# Patient Record
Sex: Female | Born: 1935 | Race: White | Hispanic: No | Smoking: Never smoker
Health system: Southern US, Community
[De-identification: ages and names within clinical notes are randomized; demographics above are authoritative.]

## PROBLEM LIST (undated history)

## (undated) DIAGNOSIS — K219 Gastro-esophageal reflux disease without esophagitis: Secondary | ICD-10-CM

## (undated) DIAGNOSIS — R296 Repeated falls: Secondary | ICD-10-CM

## (undated) DIAGNOSIS — T4145XA Adverse effect of unspecified anesthetic, initial encounter: Secondary | ICD-10-CM

## (undated) DIAGNOSIS — K589 Irritable bowel syndrome without diarrhea: Secondary | ICD-10-CM

## (undated) DIAGNOSIS — K0820 Unspecified atrophy of edentulous alveolar ridge: Secondary | ICD-10-CM

## (undated) DIAGNOSIS — E8809 Other disorders of plasma-protein metabolism, not elsewhere classified: Secondary | ICD-10-CM

## (undated) DIAGNOSIS — N879 Dysplasia of cervix uteri, unspecified: Secondary | ICD-10-CM

## (undated) DIAGNOSIS — G7109 Other specified muscular dystrophies: Principal | ICD-10-CM

## (undated) DIAGNOSIS — M419 Scoliosis, unspecified: Secondary | ICD-10-CM

## (undated) DIAGNOSIS — W19XXXA Unspecified fall, initial encounter: Secondary | ICD-10-CM

## (undated) DIAGNOSIS — G7100 Muscular dystrophy, unspecified: Secondary | ICD-10-CM

## (undated) DIAGNOSIS — M1990 Unspecified osteoarthritis, unspecified site: Secondary | ICD-10-CM

## (undated) DIAGNOSIS — H35342 Macular cyst, hole, or pseudohole, left eye: Secondary | ICD-10-CM

## (undated) DIAGNOSIS — I10 Essential (primary) hypertension: Secondary | ICD-10-CM

## (undated) DIAGNOSIS — T8859XA Other complications of anesthesia, initial encounter: Secondary | ICD-10-CM

## (undated) DIAGNOSIS — N952 Postmenopausal atrophic vaginitis: Secondary | ICD-10-CM

## (undated) DIAGNOSIS — C4491 Basal cell carcinoma of skin, unspecified: Secondary | ICD-10-CM

## (undated) HISTORY — DX: Essential (primary) hypertension: I10

## (undated) HISTORY — DX: Dysplasia of cervix uteri, unspecified: N87.9

## (undated) HISTORY — PX: COLPOSCOPY: SHX161

## (undated) HISTORY — DX: Unspecified fall, initial encounter: W19.XXXA

## (undated) HISTORY — DX: Muscular dystrophy, unspecified: G71.00

## (undated) HISTORY — DX: Postmenopausal atrophic vaginitis: N95.2

## (undated) HISTORY — DX: Other specified muscular dystrophies: G71.09

## (undated) HISTORY — DX: Unspecified atrophy of edentulous alveolar ridge: K08.20

## (undated) HISTORY — DX: Basal cell carcinoma of skin, unspecified: C44.91

## (undated) HISTORY — DX: Other disorders of plasma-protein metabolism, not elsewhere classified: E88.09

## (undated) HISTORY — DX: Macular cyst, hole, or pseudohole, left eye: H35.342

## (undated) HISTORY — DX: Repeated falls: R29.6

## (undated) HISTORY — PX: ABDOMINAL HYSTERECTOMY: SHX81

## (undated) HISTORY — DX: Irritable bowel syndrome, unspecified: K58.9

## (undated) HISTORY — DX: Scoliosis, unspecified: M41.9

---

## 1969-10-11 HISTORY — PX: BREAST LUMPECTOMY: SHX2

## 1979-10-12 HISTORY — PX: TOTAL ABDOMINAL HYSTERECTOMY W/ BILATERAL SALPINGOOPHORECTOMY: SHX83

## 1998-06-01 ENCOUNTER — Emergency Department (HOSPITAL_COMMUNITY): Admission: EM | Admit: 1998-06-01 | Discharge: 1998-06-01 | Payer: Self-pay | Admitting: Emergency Medicine

## 1998-06-03 ENCOUNTER — Ambulatory Visit (HOSPITAL_BASED_OUTPATIENT_CLINIC_OR_DEPARTMENT_OTHER): Admission: RE | Admit: 1998-06-03 | Discharge: 1998-06-03 | Payer: Self-pay | Admitting: Orthopedic Surgery

## 1998-07-28 ENCOUNTER — Other Ambulatory Visit: Admission: RE | Admit: 1998-07-28 | Discharge: 1998-07-28 | Payer: Self-pay | Admitting: Obstetrics and Gynecology

## 1998-08-12 ENCOUNTER — Ambulatory Visit (HOSPITAL_BASED_OUTPATIENT_CLINIC_OR_DEPARTMENT_OTHER): Admission: RE | Admit: 1998-08-12 | Discharge: 1998-08-12 | Payer: Self-pay | Admitting: Orthopedic Surgery

## 1999-08-28 ENCOUNTER — Other Ambulatory Visit: Admission: RE | Admit: 1999-08-28 | Discharge: 1999-08-28 | Payer: Self-pay | Admitting: Obstetrics and Gynecology

## 2000-08-30 ENCOUNTER — Other Ambulatory Visit: Admission: RE | Admit: 2000-08-30 | Discharge: 2000-08-30 | Payer: Self-pay | Admitting: Obstetrics and Gynecology

## 2000-10-17 ENCOUNTER — Ambulatory Visit (HOSPITAL_COMMUNITY): Admission: RE | Admit: 2000-10-17 | Discharge: 2000-10-18 | Payer: Self-pay | Admitting: Ophthalmology

## 2000-10-17 ENCOUNTER — Encounter: Payer: Self-pay | Admitting: Ophthalmology

## 2001-07-04 ENCOUNTER — Ambulatory Visit (HOSPITAL_COMMUNITY): Admission: RE | Admit: 2001-07-04 | Discharge: 2001-07-04 | Payer: Self-pay | Admitting: Gastroenterology

## 2001-08-31 ENCOUNTER — Other Ambulatory Visit: Admission: RE | Admit: 2001-08-31 | Discharge: 2001-08-31 | Payer: Self-pay | Admitting: Obstetrics and Gynecology

## 2001-10-11 HISTORY — PX: EYE SURGERY: SHX253

## 2002-12-14 ENCOUNTER — Other Ambulatory Visit: Admission: RE | Admit: 2002-12-14 | Discharge: 2002-12-14 | Payer: Self-pay | Admitting: Obstetrics and Gynecology

## 2003-08-11 ENCOUNTER — Emergency Department (HOSPITAL_COMMUNITY): Admission: AD | Admit: 2003-08-11 | Discharge: 2003-08-11 | Payer: Self-pay | Admitting: Emergency Medicine

## 2004-11-30 ENCOUNTER — Other Ambulatory Visit: Admission: RE | Admit: 2004-11-30 | Discharge: 2004-11-30 | Payer: Self-pay | Admitting: Obstetrics and Gynecology

## 2005-07-05 ENCOUNTER — Emergency Department (HOSPITAL_COMMUNITY): Admission: EM | Admit: 2005-07-05 | Discharge: 2005-07-05 | Payer: Self-pay | Admitting: Family Medicine

## 2005-09-14 ENCOUNTER — Emergency Department (HOSPITAL_COMMUNITY): Admission: EM | Admit: 2005-09-14 | Discharge: 2005-09-14 | Payer: Self-pay | Admitting: Emergency Medicine

## 2005-12-21 ENCOUNTER — Other Ambulatory Visit: Admission: RE | Admit: 2005-12-21 | Discharge: 2005-12-21 | Payer: Self-pay | Admitting: Obstetrics and Gynecology

## 2006-02-09 ENCOUNTER — Encounter: Payer: Self-pay | Admitting: Emergency Medicine

## 2006-02-28 ENCOUNTER — Emergency Department (HOSPITAL_COMMUNITY): Admission: EM | Admit: 2006-02-28 | Discharge: 2006-02-28 | Payer: Self-pay | Admitting: Emergency Medicine

## 2006-10-26 ENCOUNTER — Emergency Department (HOSPITAL_COMMUNITY): Admission: EM | Admit: 2006-10-26 | Discharge: 2006-10-26 | Payer: Self-pay | Admitting: Family Medicine

## 2006-11-07 ENCOUNTER — Encounter
Admission: RE | Admit: 2006-11-07 | Discharge: 2007-02-05 | Payer: Self-pay | Admitting: Physical Medicine & Rehabilitation

## 2006-11-08 ENCOUNTER — Ambulatory Visit: Payer: Self-pay | Admitting: Physical Medicine & Rehabilitation

## 2006-11-28 ENCOUNTER — Encounter: Admission: RE | Admit: 2006-11-28 | Discharge: 2006-12-26 | Payer: Self-pay | Admitting: Endocrinology

## 2006-12-26 ENCOUNTER — Other Ambulatory Visit: Admission: RE | Admit: 2006-12-26 | Discharge: 2006-12-26 | Payer: Self-pay | Admitting: Obstetrics and Gynecology

## 2007-06-26 ENCOUNTER — Encounter
Admission: RE | Admit: 2007-06-26 | Discharge: 2007-09-24 | Payer: Self-pay | Admitting: Physical Medicine & Rehabilitation

## 2007-06-27 ENCOUNTER — Ambulatory Visit: Payer: Self-pay | Admitting: Physical Medicine & Rehabilitation

## 2007-08-16 ENCOUNTER — Ambulatory Visit: Payer: Self-pay | Admitting: Physical Medicine & Rehabilitation

## 2007-09-13 ENCOUNTER — Encounter
Admission: RE | Admit: 2007-09-13 | Discharge: 2007-12-12 | Payer: Self-pay | Admitting: Physical Medicine & Rehabilitation

## 2007-10-09 ENCOUNTER — Ambulatory Visit: Payer: Self-pay | Admitting: Physical Medicine & Rehabilitation

## 2007-11-01 ENCOUNTER — Encounter
Admission: RE | Admit: 2007-11-01 | Discharge: 2008-01-30 | Payer: Self-pay | Admitting: Physical Medicine & Rehabilitation

## 2007-11-29 ENCOUNTER — Ambulatory Visit: Payer: Self-pay | Admitting: Physical Medicine & Rehabilitation

## 2007-12-06 ENCOUNTER — Encounter
Admission: RE | Admit: 2007-12-06 | Discharge: 2007-12-27 | Payer: Self-pay | Admitting: Physical Medicine & Rehabilitation

## 2007-12-27 ENCOUNTER — Other Ambulatory Visit: Admission: RE | Admit: 2007-12-27 | Discharge: 2007-12-27 | Payer: Self-pay | Admitting: Obstetrics and Gynecology

## 2008-01-11 ENCOUNTER — Ambulatory Visit: Payer: Self-pay | Admitting: Physical Medicine & Rehabilitation

## 2008-02-14 ENCOUNTER — Emergency Department (HOSPITAL_COMMUNITY): Admission: EM | Admit: 2008-02-14 | Discharge: 2008-02-14 | Payer: Self-pay | Admitting: Family Medicine

## 2008-07-01 ENCOUNTER — Encounter
Admission: RE | Admit: 2008-07-01 | Discharge: 2008-07-02 | Payer: Self-pay | Admitting: Physical Medicine & Rehabilitation

## 2008-07-02 ENCOUNTER — Ambulatory Visit: Payer: Self-pay | Admitting: Physical Medicine & Rehabilitation

## 2008-11-19 ENCOUNTER — Ambulatory Visit (HOSPITAL_COMMUNITY): Admission: RE | Admit: 2008-11-19 | Discharge: 2008-11-19 | Payer: Self-pay | Admitting: Endocrinology

## 2008-12-31 ENCOUNTER — Other Ambulatory Visit: Admission: RE | Admit: 2008-12-31 | Discharge: 2008-12-31 | Payer: Self-pay | Admitting: Obstetrics and Gynecology

## 2008-12-31 ENCOUNTER — Encounter: Payer: Self-pay | Admitting: Obstetrics and Gynecology

## 2008-12-31 ENCOUNTER — Ambulatory Visit: Payer: Self-pay | Admitting: Obstetrics and Gynecology

## 2009-12-18 ENCOUNTER — Emergency Department (HOSPITAL_COMMUNITY): Admission: EM | Admit: 2009-12-18 | Discharge: 2009-12-18 | Payer: Self-pay | Admitting: Family Medicine

## 2010-01-01 ENCOUNTER — Ambulatory Visit: Payer: Self-pay | Admitting: Obstetrics and Gynecology

## 2010-01-01 ENCOUNTER — Other Ambulatory Visit: Admission: RE | Admit: 2010-01-01 | Discharge: 2010-01-01 | Payer: Self-pay | Admitting: Obstetrics and Gynecology

## 2011-01-04 LAB — POCT RAPID STREP A (OFFICE): Streptococcus, Group A Screen (Direct): NEGATIVE

## 2011-02-23 NOTE — Assessment & Plan Note (Signed)
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  I saw her last  approximately 1 month ago.  She was then sent to aquatic exercise and  she has been very happy with this, in fact, states it is about the best  thing she has ever done for her weakness and mobility problems.  She can  climb steps.  She drives.  She uses the yard mower.  I did review her  therapy notes from physical therapy at [facility].  She has made some improvements in strength.   She has a Trendelenburg gait pattern.  She has decreased arm external  rotation strength.  She has good biceps and triceps grip as well as hip  flexion, knee extension, and hip adductor  strength.   REVIEW OF SYSTEMS:  Positive with trouble walking, diarrhea,  constipation, and weight gain.   EXAMINATION:  VITAL SIGNS:  Blood pressure 142/71, pulse 70,  respirations 18, oxygen saturation 96% on room air.  GENERAL:  Ambulates with cane, Trendelenburg gait.  See above for the  rest of exam.   IMPRESSION:  Limb-girdle dystrophy, making progress with aquatic  exercise in terms of her overall stamina and strength, submaximal  exercise with emphasis on balance.   PLAN:  Will continue 6 more visits and see her back in a month.  She  will need to be transitioned to a community program.      Erick Colace, M.D.  Electronically Signed     AEK/MedQ  D:  07/25/2007 10:04:43  T:  07/25/2007 16:08:51  Job #:  [id number]   cc:   Jeannett Senior A. Evlyn Kanner, M.D.  Fax: [phone]   Melvyn Novas, M.D.  Fax: [phone]

## 2011-02-23 NOTE — Procedures (Signed)
[name], [name]             ACCOUNT NO.:  [id number]   MEDICAL RECORD NO.:  [id number]            PATIENT TYPE:   LOCATION:                                 FACILITY:   PHYSICIAN:  Erick Colace, M.D.   DATE OF BIRTH:   DATE OF PROCEDURE:  10/10/2007  DATE OF DISCHARGE:                               OPERATIVE REPORT   Treatment today performed for sinus related pain, bilateral BL SDU 24.5  a 20 Hz stim between SDU and BL 2 bilaterally.  The patient tolerated  the procedure well.  Return in 1 month.  She has had good relief of her  typical sinus pain and congestion.      Erick Colace, M.D.  Electronically Signed     AEK/MEDQ  D:  10/10/2007 17:07:38  T:  10/11/2007 08:52:34  Job:  [id number]

## 2011-02-23 NOTE — Procedures (Signed)
Catherine Harding, Catherine Harding             ACCOUNT NO.:  [id number]   MEDICAL RECORD NO.:  [id number]          PATIENT TYPE:  REC   LOCATION:  [facility]                          FACILITY:  [facility]   PHYSICIAN:  Erick Colace, M.D.DATE OF BIRTH:  1929/11/23   DATE OF PROCEDURE:  11/30/2007  DATE OF DISCHARGE:                               OPERATIVE REPORT   Ms. Ludwig follows up today for acupuncture treatment.   INDICATIONS:  Sinus pain.  She has had good relief.  She has actually  had no sinus infections this last winter since I last saw her on November 02, 2007.   Needles placed at bilateral BLs and bladder 2, STS, and stomach 2,  midline at DU24.5, electrical stimulation at 20 Hz for 30 minutes  between ST2 and BL2.  The patient tolerated procedure well.  Return in 1  month.      Erick Colace, M.D.  Electronically Signed     AEK/MEDQ  D:  11/30/2007 10:19:34  T:  12/01/2007 08:20:55  Job:  [id number]   cc:   Melvyn Novas, M.D.  Fax: [phone]

## 2011-02-23 NOTE — Procedures (Signed)
NAMEMARYLEE, Catherine Harding             ACCOUNT NO.:  [id number]   MEDICAL RECORD NO.:  [id number]          PATIENT TYPE:  REC   LOCATION:  [facility]                          FACILITY:  [facility]   PHYSICIAN:  Erick Colace, M.D.DATE OF BIRTH:  June 15, 1930   DATE OF PROCEDURE:  09/14/2007  DATE OF DISCHARGE:                               OPERATIVE REPORT   Acupuncture treatment.  Bilateral BL2, ST2 and midline 24.5 DU 24.5.  4  Hz stim between ST2 and BL2 20 Hz x 30 minutes.  The patient tolerated  the procedure well.  Treatment is for sinus related pain.      Erick Colace, M.D.  Electronically Signed     AEK/MEDQ  D:  09/14/2007 14:00:52  T:  09/14/2007 14:37:51  Job:  [id number]

## 2011-02-23 NOTE — Assessment & Plan Note (Signed)
Catherine Harding returns today.  She has a history of limb-girdle  dystrophy.  She has had falls.  She has had a fall despite using a cane.  She has gone through a balance program, but has not received any type of  ongoing lower extremity strengthening program.   EXAMINATION:  GENERAL:  No acute distress.  Mood and affect appropriate.  Her gait is waddling.   FUNCTIONAL STATUS:  Includes problems walking, which reduce her ability  to shop by herself.  She doe snot use a cane, but she can drive.   I would like for her to get a submaximal hip extensor, knee extensor  strengthening program from outpatient physical therapy, two visits.  I  will see her back in six weeks to monitor this.   I also had made previous recommendation for aquatic therapy that she can  do in a community based program, and she will also start this once the  weather gets nicer      Erick Colace, M.D.  Electronically Signed     AEK/MedQ  D:  11/30/2007 10:21:49  T:  11/30/2007 19:12:20  Job #:  [id number]   cc:   [facility]  [address]  [place], [place] [address]  [phone]

## 2011-02-23 NOTE — Procedures (Signed)
NAMECECILEE, Catherine Harding             ACCOUNT NO.:  [id number]   MEDICAL RECORD NO.:  [id number]          PATIENT TYPE:  REC   LOCATION:  [facility]                          FACILITY:  [facility]   PHYSICIAN:  Erick Colace, M.D.DATE OF BIRTH:  1930/03/31   DATE OF PROCEDURE:  08/29/2007  DATE OF DISCHARGE:                               OPERATIVE REPORT   ACUPUNCTURE TREATMENT FOR SINUS PAIN:   Needles placed to bilateral BL2, ST2, DE24.5, E-stim at 20 hertz, 30  minutes.  The patient tolerated the procedure well.      Erick Colace, M.D.  Electronically Signed     AEK/MEDQ  D:  08/29/2007 09:41:07  T:  08/29/2007 14:04:47  Job:  [id number]   cc:   Melvyn Novas, M.D.  Fax: [phone]

## 2011-02-23 NOTE — Procedures (Signed)
Catherine Harding, Catherine Harding             ACCOUNT NO.:  [id number]   MEDICAL RECORD NO.:  [id number]          PATIENT TYPE:  REC   LOCATION:  [facility]                          FACILITY:  [facility]   PHYSICIAN:  Erick Colace, M.D.DATE OF BIRTH:  1930-04-30   DATE OF PROCEDURE:  11/02/2007  DATE OF DISCHARGE:                               OPERATIVE REPORT   Treatment today is acupuncture for sinus pain.   Needles placed at bilateral BL-2, ST-2, and midline DU-24.5, Estem 20 Hz  x30 minutes between ST-2 and BL-2.  The patient tolerated the procedure  well.  Return in one month.      Erick Colace, M.D.  Electronically Signed     AEK/MEDQ  D:  11/02/2007 17:33:34  T:  11/02/2007 23:09:33  Job:  [id number]   cc:   Jeannett Senior A. Evlyn Kanner, M.D.  Fax: [phone]   Melvyn Novas, M.D.  Fax: [phone]

## 2011-02-23 NOTE — Assessment & Plan Note (Signed)
This is a follow-up visit, 77 year old female with limb-girdle  dystrophy, progressive weakness in the lower greater than upper  extremities causing gait disorder.  I saw her on November 08, 2006 for  acupuncture evaluation.   The patient decided not to pursue the acupuncture.  In the interval  period of time she has noted some progressive weakness.  She has tried  some physical therapy in the past but feels like she was actually made  weaker by it.  This is land based therapy.   She can climb steps.  She can drive.  She continues to do yard work.  Does the riding mower.  Does not use a push mower.   She has a history of irritable bowel syndrome with alternating  constipation and diarrhea.   SOCIAL HISTORY:  Divorced, lives alone.   PHYSICAL EXAMINATION:  VITAL SIGNS:  Her blood pressure is 152/78, pulse  72, respirations 18, O2 sat 96% on room air.  She ambulates with a cane.  He affect is alert.  Orientation x3.  She is well developed, well  nourished.  NECK:  Her neck has good range of motion.  EXTREMITIES:  Her upper extremity strength is 4+/5 bilateral deltoid,  biceps, triceps grip.  In the lower extremities she has 3- in the hip  extensors, she has 4- in the hip flexors, 3+ at the hip abductors on the  left and 4- on the right.  Knee extensors are 4+.  Ankle dorsiflexors  are 5.  Her deep tendon reflexes are normal.  Her gait is a wobbling  type of gluteus medius gait bilaterally.  Only mild lordosis.   IMPRESSION:  Limb-girdle dystrophy primarily with hip extensor and  abductor as well as flexor weakness relatively intact adductor.  No  significant quad or ankle weakness.  Also, her shoulder girdle is not  showing any appreciable weakness on exam; however, does notice fatigue  with more prolonged activity.   We discussed overall principal of submaximal exercise in the setting of  muscular dystrophy.  She has tried previous Thera-Band type exercises  and feels like she had  reduction of her overall function as a result of  this.  For this reason, I have recommended aquatic exercise with  precautions of submaximal exercise and doing it no more than 2 times per  week.  I have encouraged her current activities with include walking as  well as riding mower usage and even some vacuuming.  Remain concerned  about vacuuming and getting caught up in the cords.  She does have some  balance issues but has already gone through balance retraining program  out at [facility].  Strategy mainly would be to minimize hazards at this  point.  No sensory component.  No visual component.   I will see her back in 1 month for further followup on her progress.      Erick Colace, M.D.  Electronically Signed     AEK/MedQ  D:  06/27/2007 14:21:42  T:  06/27/2007 15:08:32  Job #:  [id number]   cc:   Jeannett Senior A. Evlyn Kanner, M.D.  Fax: [phone]   Melvyn Novas, M.D.  Fax: [phone]   Tasia Catchings, M.D.  Fax: [phone]   Lunette Stands, M.D.  Fax: [phone]

## 2011-02-23 NOTE — Assessment & Plan Note (Signed)
Catherine Harding returns today.  She has a history of limb girdle dystrophy  with falls.  I sent her through the [facility].  She has been able to walk without a cane at home now without  falls.  She has done some submaximal lower extremity strengthening,  particular around the hip girdle.  She has also had some work done in  terms of relieving intrascapular pain.   In terms of her sinuses, she has not had any difficulty that way.   She still has lower extremity weakness as expected from her limb girdle  dystrophy but no exacerbation.   REVIEW OF SYSTEMS:  Positive for some weight gain in addition to her  trouble walking, weakness, tremor which are chronic.   PHYSICAL EXAMINATION:  VITAL SIGNS:  Her blood pressure is 141/62, pulse  68, respirations 18, O2 sat 96% on room air.  GENERAL:  No acute distress.  Mood and affect appropriate.  MUSCULOSKELETAL:  Her gait is waddling due to hip abductor weakness.  She has bilateral Trendelenburg.  Her left hip abductors are 3 minus,  hip extensors are 3 minus, right hip extensor 3 minus, right hip  abductor 3.  Quad ankle dorsiflexors are 5/5.   IMPRESSION:  1. Limb girdle dystrophy primarily affecting lower extremities      proximal hip area.  2. Balance disorder secondary to above, improved.   PLAN:  1. Continue home exercise program.  2. No medications prescribed through this office.  3. I will see her back in about 6 months or sooner should she have      some increasing problem with falls or weakness or with sinus pain.      Erick Colace, M.D.  Electronically Signed     AEK/MedQ  D:  01/11/2008 10:52:01  T:  01/11/2008 11:15:18  Job #:  [id number]   cc:   Melvyn Novas, M.D.  Fax: [phone]   Tasia Catchings, M.D.  Fax: [phone]   Tera Mater. Evlyn Kanner, M.D.  Fax: [phone]

## 2011-02-23 NOTE — Assessment & Plan Note (Signed)
A 77 year old female with limb girdle dystrophy, progressive weakness of  lower extremities and upper extremities causing gait disorder.  I saw  her last month, and sent to aquatic exercise, and has been doing well  with this.  However, over the last couple of weeks she has had some  problems with bronchitis.  She thinks the moisture from the pool  exacerbates some of her respiratory symptoms, and wants to hold off on  further treatment until this can be resolved.   Patient has sinus pain, a lot of sinus drainage per her report.  Her  sleep is overall good.  She has some pain in the thighs, and more  particularly in the left knee and right shoulder.  However, she has quit  taking glucosamine over the last couple of weeks.  She uses a cane  mainly for balance.  She has some difficulty with shopping and household  duties, trouble walking, diarrhea, and constipation.   EXAMINATION:  Reveals an elderly female in no acute distress.  Her blood pressure is 144/85.  Pulse 82.  Respirations 18.  Her O2  saturation 95% room air.  GENERAL:  In no acute distress.  Orientation x3.  Affect is bright.  She has some tenderness over the frontal sinuses, as well as maxillary  sinuses.  She has pain in the right shoulder with adduction maneuver.  Her knee  has no effusion on the left side, but medial joint line tenderness.  She  has full range of motion of the knees, hips, ankles, as well as elbows,  wrists, and fingers.  However, shoulder on the right is reduced in  adduction due to pain.  Motor strength is 5- at the deltoids, 5 in the  biceps, triceps grip, 4 at the hip flexors, 5 at the quads, TAs, and  gastrocs bilaterally.  Gait shows no evidence of toe drag or knee  instability.  She does walk slowly with a widened base of support.   IMPRESSION:  1. Limb girdle dystrophy.  Making progress with aquatic exercise in      terms of overall stamina, however, she has had to stop it due to  respiratory issues.  She would like to get a home exercise program,      and I think that is a good idea that she keep up over the winter,      and perhaps get in to a community based program once her      respiratory issues seem to have resolved.  2. Sinusitis.  Discussed that she could try some acupuncture to see if      this may help with reducing congestion and some of the sinus pain.   ADDENDUM  Needles placed bilaterally ST-2, BL-2, and DU-24.5.  Treatment time 20  minutes.  The patient tolerated the procedure well.      Erick Colace, M.D.  Electronically Signed     AEK/MedQ  D:  08/17/2007 12:19:36  T:  08/17/2007 16:11:30  Job #:  [id number]   cc:   Jeannett Senior A. Evlyn Kanner, M.D.  Fax: [phone]   Melvyn Novas, M.D.  Fax: [phone]   Aris Lot, M.D.  [facility]

## 2011-02-23 NOTE — Assessment & Plan Note (Signed)
Catherine Harding returns today.  She has a history of limb-girdle  dystrophy.  She has had no further falls since I last saw her on January 11, 2008.  She uses a cane outside the home mainly.  She no longer climbs  steps but can drive.  She walks 10 minutes at a time.  She has no  significant pain.  She needs certain assistance with household duties  and shopping.  She has problems with walking and dizziness.   PHYSICAL EXAMINATION:  VITAL SIGNS:  Blood pressure is 136/72, pulse 70,  respiratory rate is 17, and O2 sat 96% on room air.   INTERVAL MEDICAL HISTORY:  She has had a DEXA scan showing T score of -  2.6.  She remains on Fosamax.   Her sinus discomfort has not become an issue yet this year.  She does  have chronic sinusitis and sinus pain in the winter time typically.   Her Oswestry scale today is 40%.   Her gait is waddling due to hip abductor weakness.  She has 4- hip  abductors.  Hip flexors are 4-.  Quads and ankle dorsiflexors are 5/5.   1. Limb-girdle dystrophy.  She actually has been more active and some      muscle improvements noted.  2. Balance disorder as stated above, improved after balance program      and lower extremity strengthening.  3. Osteoporosis with fall risk.  Following with Dr. Evlyn Kanner, getting      some vitamin D supplementation now.      Erick Colace, M.D.  Electronically Signed     AEK/MedQ  D:  07/02/2008 16:10:96  T:  07/02/2008 23:19:41  Job #:  [id number]   cc:   Jeannett Senior A. Evlyn Kanner, M.D.  Fax: [phone]   Tasia Catchings, M.D.  Fax: [phone]   Melvyn Novas, M.D.  Fax: [phone]

## 2011-02-26 NOTE — Op Note (Signed)
[facility]  Patient:    Catherine Harding, Catherine Harding                    MRN: [id number] Proc. Date: 10/17/00 Adm. Date:  [id number] Attending:  Ernesto Rutherford                           Operative Report  PREOPERATIVE DIAGNOSIS:  Macular hole, left eye--stage III.  POSTOPERATIVE DIAGNOSIS:  Macular hole, left eye--stage III.  OPERATION PERFORMED: 1. Posterior vitrectomy and membrane peel--internal limiting membrane,    left eye. 2. Injection of vitreous substitute--SF6 20% left eye.  SURGEON:  Ernesto Rutherford, M.D.  ANESTHESIA:  General endotracheal.  INDICATIONS FOR PROCEDURE:  The patient is a  77 year old woman who has profound vision loss of the left eye on the basis of a large approximately 900 to 1000 micron stage III macular hole with an operculum which measured approximately 125 microns in size.  The patient was recently pseudophakic in anticipation of vitrectomy membrane peel--internal limiting membrane left eye. The patient understands the risks of anesthesia, including the rare occurrence of death, but also to the eye including retinal detachment, need for another surgery, no change invision, loss of vision, progressive disease despite intervention.  After appropriate signed consent was obtained, the patient was taken to operating room.  DESCRIPTION OF PROCEDURE:  In the operating room  general endotracheal anesthesia was instituted.  The left periocular region had been preoperatively marked.  It was now sterilely prepped and draped in the usual ophthalmic fashion.  A lidspeculum was applied.  Conjunctival peritomy was fashioned temporally and superonasally.  4 mm infusion was secured inferotemporally 3.5 mm posterior limbus inferotemporal quadrant.  Placement vitreous cavity verified visually.  Superior sclerotomy then fashioned.   Wild microscope placed in position with the Biom attached.  Core vitrectomy was then begun. Iatrogenic  posterior vitreous detachment was necessary and this was engaged with active suction nasal to the optic nerve and the posterior hyaloid was moved off the posterior fold and then anterior ____________  360 degrees. The vitreous skirt was then trimmed  360 degrees.  At this time an 80% fluid-air exchange was then completed. Diluted ICG which had been premixed was then placed overlying the posterior pole.  The dilution was 0.5 cc diluted with an additional 4.5 cc of balanced salt solution injected over the posterior pole.  This was allowed to remain only long enough to allow for ____________ and only 1 cc of this was used.  This was allowed to remain on the posterior pole only long enough to aspirate this passively with [facility] brush.  At this time a Rice pick was then used to engage the internal limiting membrane and then elevation was then carried out with the [facility] forceps 360 degrees and excellent mobilization of the ____________ of the macular hole confirmed.  At this time a fluid-air exchange was then completed.  The hole edges were mobilized nicely.  Air-SF6 20% was then injected.  Superior sclerotomies were closed with 7-0 Vicryl sutures.  The infusion removed and similarly closed with 7-0 Vicryl sutures.  Subconjunctival injection of antibiotic and steroid were applied after closure of the conjunctiva with 7-0 Vicryl suture.  A sterile patch and Fox shield were applied.  The patient tolerated the procedure well without complication. DD:  10/17/00 TD:  10/17/00 Job: [id number]

## 2011-02-26 NOTE — Group Therapy Note (Signed)
Tuesday, November 08, 2006:   Consult requested for consideration of acupuncture for appetite  suppression by Dr. Porfirio Mylar Dohmeier.   HISTORY:  A 77 year old female with history of significant four limb  girdle dystrophy with progressive weakness in the lower and upper  extremities causing gait disorder. She has had some increased weight  because of inability to exercise and is looking for options to lose  weight given her limited exercise capacity.   REVIEW OF SYSTEMS:  Positive for diarrhea, constipation, shortness of  breath and weight gain.   PAST MEDICAL HISTORY:  Significant for:  1. Atypical pseudocholinesterase deficiency limiting certain muscle      relaxants associated with anesthesia. In addition she has ALLERGY      TO SULFA, CODEINE, CIPRO, CELEBREX AND MORPHINE and POSSIBLE      PROBLEMS WITH LIDOCAINE AS WELL.  2. Hypertension as well.   Other physicians include Dr. Sherin Quarry of Gastroenterology, Dr. Adrian Prince and Dr. Melvyn Novas, M.D., and Dr. Lunette Stands.   PHYSICAL EXAMINATION:  GENERAL: No acute distress. Mood and affect  appropriate.  Her gait is Trendelenburg, a waddling type of gait bilaterally. She has  4/5 strength in deltoids, 5 in the biceps/triceps group, 4- in the hip  flexor and 4 knee extensor and 5 in the ankle dorsiflexors bilaterally.  Neck range of motion is good. No facial weakness noted. No tenderness to  palpation in the thoracic, lumbar or cervical para-paraspinal muscles.   Blood pressure is 144/68, pulse 79, respirations 18. O2 sat is 95% on  room air.   IMPRESSION:  Limb girdle dystrophy. The patient can do submaximal  exercise, but this would likely not result in sufficient weight loss.  Given multiple medication allergies, would shy away from any  pharmaceutical appetite suppressants.   We did discuss utility of acupuncture and discussed the literature  supporting the use of acupuncture for weight loss is rather limited.  Published randomized cross-over study in Int J of Obesity Management  2005, demonstrated reduction of waist circumference with a twice a week  x 6wk protocol suing points KI 14, ST 28, CV 6, CV9, ST 40, ST 36 and SP  4. Did discuss the use of auricular type of acupuncture with self-  adhesive needles for this purpose. She would like to check her insurance  benefits for this and will assist her in that matter.   I will do a preview treatment today to assess her tolerance.   ADDENDUM:  Acupuncture treatment today consists of needles at DU24.5 and DU20.  Electrical stimulation between these two points at 4 Hz x15 minutes. In  addition, left auricular points, hypothalamus as well as stomach were  utilized. The patient tolerated the procedure well. Pre-post procedure  instruction given. She is to remove the self-adhesive needles in 1 week  if they do not fall out earlier than that. If she has any difficulty she  can return to clinic.   Thank you for this interesting consultation.      Erick Colace, M.D.  Electronically Signed     AEK/MedQ  D:  11/08/2006 17:02:55  T:  11/08/2006 20:36:13  Job #:  [id number]   cc:   Melvyn Novas, M.D.  Fax: [phone]

## 2011-05-11 ENCOUNTER — Other Ambulatory Visit: Payer: Self-pay | Admitting: Obstetrics and Gynecology

## 2011-05-21 NOTE — Telephone Encounter (Signed)
Addended by: Venora Maples on: 05/21/2011 04:10 PM   Modules accepted: Orders

## 2011-05-21 NOTE — Telephone Encounter (Signed)
PT ASKING FOR REFILLS ON ESTRADIOL & [facility] TOLD HER THEY HAVE NEVER HEARD FROM OUR OFFICE ON IT. I TOLD HER WE CONTACTED THEM ON 05/11/11 & DENIED IT SINCE NO AEX SET & LAST ONE WAS3/24/11. I TOLD HER IF SHE WOULD SET UP HER OVERDUE AEX I WOULD CHECK WITH DR. GOTTSEGEN TO SEE IF WE COULD FILL IT ONE TIME FOR HER. SHE STATES SHE DOES NOT WANT TO SET UP AN APPT.

## 2011-09-08 ENCOUNTER — Encounter: Payer: Self-pay | Admitting: *Deleted

## 2011-09-08 DIAGNOSIS — G7100 Muscular dystrophy, unspecified: Secondary | ICD-10-CM | POA: Insufficient documentation

## 2011-09-08 DIAGNOSIS — E8809 Other disorders of plasma-protein metabolism, not elsewhere classified: Secondary | ICD-10-CM | POA: Insufficient documentation

## 2011-09-08 DIAGNOSIS — K589 Irritable bowel syndrome without diarrhea: Secondary | ICD-10-CM | POA: Insufficient documentation

## 2011-09-08 DIAGNOSIS — N952 Postmenopausal atrophic vaginitis: Secondary | ICD-10-CM | POA: Insufficient documentation

## 2011-09-08 DIAGNOSIS — M810 Age-related osteoporosis without current pathological fracture: Secondary | ICD-10-CM | POA: Insufficient documentation

## 2011-09-08 DIAGNOSIS — H35342 Macular cyst, hole, or pseudohole, left eye: Secondary | ICD-10-CM | POA: Insufficient documentation

## 2011-09-08 DIAGNOSIS — D4989 Neoplasm of unspecified behavior of other specified sites: Secondary | ICD-10-CM | POA: Insufficient documentation

## 2011-09-08 DIAGNOSIS — I10 Essential (primary) hypertension: Secondary | ICD-10-CM | POA: Insufficient documentation

## 2011-09-15 ENCOUNTER — Ambulatory Visit (INDEPENDENT_AMBULATORY_CARE_PROVIDER_SITE_OTHER): Payer: Medicare Other | Admitting: Obstetrics and Gynecology

## 2011-09-15 ENCOUNTER — Encounter: Payer: Self-pay | Admitting: Obstetrics and Gynecology

## 2011-09-15 DIAGNOSIS — N952 Postmenopausal atrophic vaginitis: Secondary | ICD-10-CM

## 2011-09-15 DIAGNOSIS — M810 Age-related osteoporosis without current pathological fracture: Secondary | ICD-10-CM

## 2011-09-15 DIAGNOSIS — Z780 Asymptomatic menopausal state: Secondary | ICD-10-CM

## 2011-09-15 DIAGNOSIS — N951 Menopausal and female climacteric states: Secondary | ICD-10-CM

## 2011-09-15 MED ORDER — ESTRADIOL 0.0375 MG/24HR TD PTTW: 1.0000 | MEDICATED_PATCH | TRANSDERMAL | Status: DC

## 2011-09-15 NOTE — Progress Notes (Signed)
The patient came back to see me today for further followup. We have had her on systemic estrogen for treatment of both menopausal symptoms and vaginal dryness with excellent results. She is having no bleeding. She is having no pelvic pain. She does have osteoporosis. She currently is not on medication for it and she's discussed with Dr. Evlyn Kanner. She has not had any fractures. She is however Foley more because of her muscular dystrophy. She is taking calcium or vitamin D. Medications that she previously tried for Fosamax and Reclast but stopped both because of side effects. 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 She is up-to-date on mammograms.  ROS: 12 system review done. See above for certain pertinent positives. Other positives include irritable bowel syndrome, hypertension, macular repair of hole, pseudo cholinesterase deficiency.  HEENT: Within normal limits.  Kennon Portela present Neck: No masses. Supraclavicular lymph nodes: Not enlarged. Breasts: Examined in both sitting and lying position. Symmetrical without skin changes or masses. Abdomen: Soft no masses guarding or rebound. No hernias. Pelvic: External within normal limits. BUS within normal limits. Vaginal examination shows good estrogen effect, no cystocele enterocele or rectocele. Cervix and uterus absent. Adnexa within normal limits. Rectovaginal confirmatory. Extremities within normal limits.  Assessment: Menopausal symptoms. Atrophic vaginitis. Osteoporosis. CIN. Muscular dystrophy.  Plan: We'll long discussion of benefits and risks of HRT. For her they are treatment of menopausal symptoms including atrophic vaginitis. Since she cannot tolerate bone drugs and is at high risk for fracture due to bone density and muscular dystrophy it would also help her for that as well. I. Would like to see her off of oral estrogen at her age. As a result of this we switched her today to Vivelle dot patch 0.0375 mg BIW. Samples and  prescription given.

## 2011-10-18 ENCOUNTER — Encounter: Payer: Self-pay | Admitting: *Deleted

## 2011-10-18 NOTE — Progress Notes (Signed)
Patient ID: Catherine Harding, female   DOB: May 28, 1930, 78 y.o.   MRN: [id number] Pt called wanting to know where she should place estrogen patch. The buttocks or on stomach as directed on box. Lm on pt vm that either would be okay.

## 2012-02-17 ENCOUNTER — Encounter: Payer: Self-pay | Admitting: Obstetrics and Gynecology

## 2012-02-29 ENCOUNTER — Other Ambulatory Visit: Payer: Self-pay

## 2012-03-21 ENCOUNTER — Ambulatory Visit (INDEPENDENT_AMBULATORY_CARE_PROVIDER_SITE_OTHER): Payer: Medicare Other | Admitting: *Deleted

## 2012-03-21 DIAGNOSIS — R609 Edema, unspecified: Secondary | ICD-10-CM

## 2012-03-24 NOTE — Procedures (Unsigned)
DUPLEX DEEP VENOUS EXAM - LOWER EXTREMITY  INDICATION:  Left lower extremity edema for 5 days.  HISTORY:  Edema:  Yes. Trauma/Surgery:  No. Pain:  Tenderness. PE:  No. Previous DVT:  No. Anticoagulants:  No. Other:  DUPLEX EXAM:               CFV   SFV   PopV  PTV    GSV               R  L  R  L  R  L  R   L  R  L Thrombosis    o  o     o     o      o     o Spontaneous   +  +     +     + Phasic        +  +     +     + Augmentation  +  +     +     +      +     + Compressible  +  +     +     +      +     + Competent  Legend:  + - yes  o - no  p - partial  D - decreased  IMPRESSION:  No evidence of deep venous thrombosis or superficial venous thrombus in the left lower extremity.   _____________________________ Janetta Hora Fields, MD  LT/MEDQ  D:  03/21/2012  T:  03/21/2012  Job:  [id number]

## 2012-06-22 ENCOUNTER — Encounter: Payer: Self-pay | Admitting: Obstetrics and Gynecology

## 2012-07-21 ENCOUNTER — Other Ambulatory Visit: Payer: Self-pay | Admitting: Orthopedic Surgery

## 2012-07-25 ENCOUNTER — Encounter (HOSPITAL_BASED_OUTPATIENT_CLINIC_OR_DEPARTMENT_OTHER): Payer: Self-pay | Admitting: *Deleted

## 2012-07-25 NOTE — Progress Notes (Signed)
To come in for [id number] Very active lady

## 2012-07-27 ENCOUNTER — Encounter (HOSPITAL_BASED_OUTPATIENT_CLINIC_OR_DEPARTMENT_OTHER): Payer: Self-pay | Admitting: *Deleted

## 2012-07-27 ENCOUNTER — Other Ambulatory Visit: Payer: Self-pay

## 2012-07-27 ENCOUNTER — Encounter (HOSPITAL_BASED_OUTPATIENT_CLINIC_OR_DEPARTMENT_OTHER)
Admission: RE | Admit: 2012-07-27 | Discharge: 2012-07-27 | Disposition: A | Discharge status: Home / Self Care | Payer: Medicare Other | Source: Ambulatory Visit | Attending: Orthopedic Surgery | Admitting: Orthopedic Surgery

## 2012-07-27 LAB — BASIC METABOLIC PANEL
CO2: 26 mEq/L (ref 19–32)
Calcium: 9.6 mg/dL (ref 8.4–10.5)
Glucose, Bld: 116 mg/dL — ABNORMAL HIGH (ref 70–99)
Sodium: 141 mEq/L (ref 135–145)

## 2012-07-27 NOTE — H&P (Signed)
Catherine Harding is an 78 y.o. female.   Chief Complaint: c/o chronic and progressive STS symptoms right long finger HPI:She now has a mass in her right palm overlying the long finger flexor sheath at the A-1 pulley consistent with a myxoid cyst. She has triggering with flexion. She is using a cane due to the development of a limb girdle Muscular Dystrophy diagnosed by Melvyn Novas, MD and confirmed by evaluation at [facility]. She has a very unsteady gait.      Past Medical History  Diagnosis Date  . IBS (irritable bowel syndrome)   . Macular hole of left eye   . Hypertension   . CIN (conjunctival intraepithelial neoplasia)   . Osteoporosis   . Atrophic vaginitis   . Basal cell carcinoma   . Jaw atrophy   . Pseudocholinesterase deficiency   . Muscular dystrophy   . GERD (gastroesophageal reflux disease)   . Arthritis   . Complication of anesthesia     pseudocholinesterase deficiency-hard to wake up    Past Surgical History  Procedure Date  . Total abdominal hysterectomy w/ bilateral salpingoophorectomy 1981  . Breast lumpectomy 1971    benign  . Eye surgery 2003    left  . Cesarean section     x2  . Abdominal hysterectomy     TAH BSO    Family History  Problem Relation Age of Onset  . Hypertension Sister    Social History:  reports that she has never smoked. She does not have any smokeless tobacco history on file. She reports that she drinks alcohol. She reports that she does not use illicit drugs.  Allergies:  Allergies  Allergen Reactions  . Celebrex (Celecoxib)   . Ciprofloxacin   . Codeine   . Morphine And Related   . Sulfa Antibiotics     No prescriptions prior to admission    Results for orders placed during the hospital encounter of 07/28/12 (from the past 48 hour(s))  BASIC METABOLIC PANEL     Status: Abnormal   Collection Time   07/27/12 11:00 AM      Component Value Range Comment   Sodium 141  135 - 145 mEq/L    Potassium 4.1  3.5 - 5.1 mEq/L    Chloride 107  96 - 112 mEq/L    CO2 26  19 - 32 mEq/L    Glucose, Bld 116 (*) 70 - 99 mg/dL    BUN 14  6 - 23 mg/dL    Creatinine, Ser 1.32  0.50 - 1.10 mg/dL    Calcium 9.6  8.4 - 44.0 mg/dL    GFR calc non Af Amer 82 (*) >90 mL/min    GFR calc Af Amer >90  >90 mL/min     No results found.   Pertinent items are noted in HPI.  Height 5\' 2"  (1.575 m), weight 72.576 kg (160 lb).  General appearance: alert Head: Normocephalic, without obvious abnormality Neck: supple, symmetrical, trachea midline Resp: clear to auscultation bilaterally Cardio: regular rate and rhythm GI: normal findings: bowel sounds normal Extremities: Inspection of her hands reveals minimal stigmata of osteoarthritis including Heberden's and Bouchard's nodes. She has a palpable 6 mm in diameter cyst overlying the right long finger A-1 pulley. She has active triggering in flexion. She has no flexion contractures of her fingers right or left. She has full AROM of her thumbs. There is no sign of stenosing tenosynovitis of her thumb at the A-1 pulley or wrist  at the first dorsal compartment.  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   DASNOIT,Srihari Shellhammer J 07/27/2012, 4:07 PM    H&P documentation: 07/28/2012  -History and Physical Reviewed  -Patient has been re-examined  -No change in the plan of care  Wyn Forster, MD

## 2012-07-28 ENCOUNTER — Encounter (HOSPITAL_BASED_OUTPATIENT_CLINIC_OR_DEPARTMENT_OTHER): Payer: Self-pay | Admitting: Anesthesiology

## 2012-07-28 ENCOUNTER — Encounter (HOSPITAL_BASED_OUTPATIENT_CLINIC_OR_DEPARTMENT_OTHER)
Admission: RE | Disposition: A | Discharge status: Home / Self Care | Payer: Self-pay | Source: Ambulatory Visit | Attending: Orthopedic Surgery

## 2012-07-28 ENCOUNTER — Ambulatory Visit (HOSPITAL_BASED_OUTPATIENT_CLINIC_OR_DEPARTMENT_OTHER): Payer: Medicare Other | Admitting: Anesthesiology

## 2012-07-28 ENCOUNTER — Encounter (HOSPITAL_BASED_OUTPATIENT_CLINIC_OR_DEPARTMENT_OTHER): Payer: Self-pay | Admitting: *Deleted

## 2012-07-28 ENCOUNTER — Ambulatory Visit (HOSPITAL_BASED_OUTPATIENT_CLINIC_OR_DEPARTMENT_OTHER)
Admission: RE | Admit: 2012-07-28 | Discharge: 2012-07-28 | Disposition: A | Discharge status: Home / Self Care | Payer: Medicare Other | Source: Ambulatory Visit | Attending: Orthopedic Surgery | Admitting: Orthopedic Surgery

## 2012-07-28 DIAGNOSIS — K219 Gastro-esophageal reflux disease without esophagitis: Secondary | ICD-10-CM | POA: Insufficient documentation

## 2012-07-28 DIAGNOSIS — I10 Essential (primary) hypertension: Secondary | ICD-10-CM | POA: Insufficient documentation

## 2012-07-28 DIAGNOSIS — Z01812 Encounter for preprocedural laboratory examination: Secondary | ICD-10-CM | POA: Insufficient documentation

## 2012-07-28 DIAGNOSIS — Z01810 Encounter for preprocedural cardiovascular examination: Secondary | ICD-10-CM | POA: Insufficient documentation

## 2012-07-28 DIAGNOSIS — M6740 Ganglion, unspecified site: Secondary | ICD-10-CM | POA: Insufficient documentation

## 2012-07-28 DIAGNOSIS — M6530 Trigger finger, unspecified finger: Secondary | ICD-10-CM | POA: Insufficient documentation

## 2012-07-28 DIAGNOSIS — M65839 Other synovitis and tenosynovitis, unspecified forearm: Secondary | ICD-10-CM | POA: Insufficient documentation

## 2012-07-28 HISTORY — DX: Adverse effect of unspecified anesthetic, initial encounter: T41.45XA

## 2012-07-28 HISTORY — DX: Other complications of anesthesia, initial encounter: T88.59XA

## 2012-07-28 HISTORY — DX: Gastro-esophageal reflux disease without esophagitis: K21.9

## 2012-07-28 HISTORY — PX: TRIGGER FINGER RELEASE: SHX641

## 2012-07-28 HISTORY — DX: Unspecified osteoarthritis, unspecified site: M19.90

## 2012-07-28 SURGERY — RELEASE, A1 PULLEY, FOR TRIGGER FINGER
Anesthesia: Monitor Anesthesia Care | Site: Hand | Laterality: Right | Wound class: Clean

## 2012-07-28 MED ORDER — LIDOCAINE HCL (CARDIAC) 20 MG/ML IV SOLN
INTRAVENOUS | Status: DC | PRN
  Administered 2012-07-28: 30 mg via INTRAVENOUS

## 2012-07-28 MED ORDER — TRAMADOL HCL 50 MG PO TABS: ORAL_TABLET | ORAL | Status: DC

## 2012-07-28 MED ORDER — ONDANSETRON HCL 4 MG/2ML IJ SOLN: 4.0000 mg | Freq: Four times a day (QID) | INTRAMUSCULAR | Status: DC | PRN

## 2012-07-28 MED ORDER — OXYCODONE HCL 5 MG/5ML PO SOLN: 5.0000 mg | Freq: Once | ORAL | Status: DC | PRN

## 2012-07-28 MED ORDER — ONDANSETRON HCL 4 MG/2ML IJ SOLN
INTRAMUSCULAR | Status: DC | PRN
  Administered 2012-07-28: 4 mg via INTRAVENOUS

## 2012-07-28 MED ORDER — FENTANYL CITRATE 0.05 MG/ML IJ SOLN: 25.0000 ug | INTRAMUSCULAR | Status: DC | PRN

## 2012-07-28 MED ORDER — CHLORHEXIDINE GLUCONATE 4 % EX LIQD: 60.0000 mL | Freq: Once | CUTANEOUS | Status: DC

## 2012-07-28 MED ORDER — TRAMADOL HCL 50 MG PO TABS
50.0000 mg | ORAL_TABLET | Freq: Once | ORAL | Status: AC
  Administered 2012-07-28: 50 mg via ORAL

## 2012-07-28 MED ORDER — LIDOCAINE HCL (PF) 2 % IJ SOLN
INTRAMUSCULAR | Status: DC | PRN
  Administered 2012-07-28: 2.5 mL

## 2012-07-28 MED ORDER — FENTANYL CITRATE 0.05 MG/ML IJ SOLN
INTRAMUSCULAR | Status: DC | PRN
  Administered 2012-07-28: 12.5 ug via INTRAVENOUS

## 2012-07-28 MED ORDER — PROPOFOL INFUSION 10 MG/ML OPTIME
INTRAVENOUS | Status: DC | PRN
  Administered 2012-07-28: 75 ug/kg/min via INTRAVENOUS

## 2012-07-28 MED ORDER — LACTATED RINGERS IV SOLN
INTRAVENOUS | Status: DC
  Administered 2012-07-28: 09:00:00 via INTRAVENOUS

## 2012-07-28 MED ORDER — OXYCODONE HCL 5 MG PO TABS: 5.0000 mg | ORAL_TABLET | Freq: Once | ORAL | Status: DC | PRN

## 2012-07-28 SURGICAL SUPPLY — 32 items
BLADE SURG 15 STRL LF DISP TIS (BLADE) ×1 IMPLANT
BLADE SURG 15 STRL SS (BLADE) ×2
BNDG CMPR 9X4 STRL LF SNTH (GAUZE/BANDAGES/DRESSINGS) ×1
BNDG CMPR MD 5X2 ELC HKLP STRL (GAUZE/BANDAGES/DRESSINGS) ×1
BNDG ELASTIC 2 VLCR STRL LF (GAUZE/BANDAGES/DRESSINGS) ×2 IMPLANT
BNDG ESMARK 4X9 LF (GAUZE/BANDAGES/DRESSINGS) ×1 IMPLANT
BRUSH SCRUB EZ PLAIN DRY (MISCELLANEOUS) ×2 IMPLANT
CLOTH BEACON ORANGE TIMEOUT ST (SAFETY) ×2 IMPLANT
CORDS BIPOLAR (ELECTRODE) ×2 IMPLANT
COVER MAYO STAND STRL (DRAPES) ×2 IMPLANT
COVER TABLE BACK 60X90 (DRAPES) ×2 IMPLANT
CUFF TOURNIQUET SINGLE 18IN (TOURNIQUET CUFF) ×2 IMPLANT
DECANTER SPIKE VIAL GLASS SM (MISCELLANEOUS) ×1 IMPLANT
DRAPE EXTREMITY T 121X128X90 (DRAPE) ×2 IMPLANT
DRAPE SURG 17X23 STRL (DRAPES) ×2 IMPLANT
GAUZE SPONGE 4X4 12PLY STRL LF (GAUZE/BANDAGES/DRESSINGS) ×4 IMPLANT
GAUZE XEROFORM 1X8 LF (GAUZE/BANDAGES/DRESSINGS) ×2 IMPLANT
GLOVE BIO SURGEON STRL SZ7 (GLOVE) ×2 IMPLANT
GLOVE BIOGEL M STRL SZ7.5 (GLOVE) ×2 IMPLANT
GLOVE ORTHO TXT STRL SZ7.5 (GLOVE) ×2 IMPLANT
GOWN PREVENTION PLUS XLARGE (GOWN DISPOSABLE) ×1 IMPLANT
GOWN STRL REIN XL XLG (GOWN DISPOSABLE) ×5 IMPLANT
NEEDLE 27GAX1X1/2 (NEEDLE) ×1 IMPLANT
PACK BASIN DAY SURGERY FS (CUSTOM PROCEDURE TRAY) ×2 IMPLANT
PAD CAST 4YDX4 CTTN HI CHSV (CAST SUPPLIES) ×1 IMPLANT
PADDING CAST COTTON 4X4 STRL (CAST SUPPLIES) ×2
SPONGE GAUZE 4X4 12PLY (GAUZE/BANDAGES/DRESSINGS) ×2 IMPLANT
STOCKINETTE 4X48 STRL (DRAPES) ×2 IMPLANT
SYR CONTROL 10ML LL (SYRINGE) ×1 IMPLANT
TOWEL OR 17X24 6PK STRL BLUE (TOWEL DISPOSABLE) ×2 IMPLANT
UNDERPAD 30X30 INCONTINENT (UNDERPADS AND DIAPERS) ×2 IMPLANT
WATER STERILE IRR 1000ML POUR (IV SOLUTION) IMPLANT

## 2012-07-28 NOTE — Transfer of Care (Signed)
Immediate Anesthesia Transfer of Care Note  [name]  Procedure(s) Performed: Procedure(s) (LRB) with comments: RELEASE TRIGGER FINGER/A-1 PULLEY (Right) - EXCISION CYST RIGHT LONG A-1 RELEASE A-1 RIGHT LONG   Patient Location: PACU  Anesthesia Type: MAC  Level of Consciousness: awake, alert , oriented and patient cooperative  Airway & Oxygen Therapy: Patient Spontanous Breathing and Patient connected to face mask oxygen  Post-op Assessment: Report given to PACU RN and Post -op Vital signs reviewed and stable  Post vital signs: Reviewed and stable  Complications: No apparent anesthesia complications

## 2012-07-28 NOTE — Anesthesia Preprocedure Evaluation (Signed)
Anesthesia Evaluation  Patient identified by MRN, date of birth, ID band Patient awake    Reviewed: Allergy & Precautions, H&P , NPO status , Patient's Chart, lab work & pertinent test results  Airway Mallampati: II  Neck ROM: full    Dental   Pulmonary          Cardiovascular hypertension,     Neuro/Psych H/o muscular dystrophy  Neuromuscular disease    GI/Hepatic GERD-  ,  Endo/Other    Renal/GU      Musculoskeletal   Abdominal   Peds  Hematology   Anesthesia Other Findings   Reproductive/Obstetrics                           Anesthesia Physical Anesthesia Plan  ASA: II  Anesthesia Plan: MAC   Post-op Pain Management:    Induction: Intravenous  Airway Management Planned:   Additional Equipment:   Intra-op Plan:   Post-operative Plan:   Informed Consent: I have reviewed the patients History and Physical, chart, labs and discussed the procedure including the risks, benefits and alternatives for the proposed anesthesia with the patient or authorized representative who has indicated his/her understanding and acceptance.     Plan Discussed with: CRNA and Surgeon  Anesthesia Plan Comments:         Anesthesia Quick Evaluation

## 2012-07-28 NOTE — Anesthesia Postprocedure Evaluation (Signed)
Anesthesia Post Note  [name]  Procedure(s) Performed: Procedure(s) (LRB): RELEASE TRIGGER FINGER/A-1 PULLEY (Right)  Anesthesia type: MAC  Patient location: PACU  Post pain: Pain level controlled and Adequate analgesia  Post assessment: Post-op Vital signs reviewed, Patient's Cardiovascular Status Stable and Respiratory Function Stable  Last Vitals:  Filed Vitals:   07/28/12 1050  BP:   Pulse: 53  Temp:   Resp: 14    Post vital signs: Reviewed and stable  Level of consciousness: awake, alert  and oriented  Complications: No apparent anesthesia complications

## 2012-07-28 NOTE — Brief Op Note (Signed)
07/28/2012  10:28 AM  PATIENT:  Catherine Harding  78 y.o. female  PRE-OPERATIVE DIAGNOSIS:  cyst right long A-1, stenosing tenosynovitis right long   POST-OPERATIVE DIAGNOSIS:  cyst right long A-1 stenosing tenosynovitis right long  PROCEDURE:  Procedure(s) (LRB) with comments: RELEASE TRIGGER FINGER/A-1 PULLEY (Right) - EXCISION CYST RIGHT LONG A-1 RELEASE A-1 RIGHT LONG   SURGEON:  Surgeon(s) and Role:    * Wyn Forster., MD - Primary  PHYSICIAN ASSISTANT:   ASSISTANTS:Akeylah Hendel Dasnoit,P.A-C    ANESTHESIA:   MAC  EBL:  Total I/O In: 400 [I.V.:400] Out: -   BLOOD ADMINISTERED:none  DRAINS: none   LOCAL MEDICATIONS USED:  XYLOCAINE   SPECIMEN:  No Specimen  DISPOSITION OF SPECIMEN:  N/A  COUNTS:  YES  TOURNIQUET:   Total Tourniquet Time Documented: Upper Arm (Right) - 9 minutes  DICTATION: .Other Dictation: Dictation Number [id number]  PLAN OF CARE: Discharge to home after PACU  PATIENT DISPOSITION:  PACU - hemodynamically stable.

## 2012-07-28 NOTE — Op Note (Signed)
[id number] 

## 2012-07-28 NOTE — Anesthesia Procedure Notes (Signed)
Procedure Name: MAC Date/Time: 07/28/2012 10:09 AM Performed by: Palmer Fahrner D Pre-anesthesia Checklist: Patient identified, Emergency Drugs available, Suction available, Patient being monitored and Timeout performed Patient Re-evaluated:Patient Re-evaluated prior to inductionOxygen Delivery Method: Simple face mask

## 2012-07-31 ENCOUNTER — Encounter (HOSPITAL_BASED_OUTPATIENT_CLINIC_OR_DEPARTMENT_OTHER): Payer: Self-pay | Admitting: Orthopedic Surgery

## 2012-07-31 NOTE — Op Note (Signed)
NAMEMUREL, STALLONE             ACCOUNT NO.:  [id number]  MEDICAL RECORD NO.:  [id number]  LOCATION:                                 FACILITY:  PHYSICIAN:  Katy Fitch. Katiria Calame, M.D. 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  OPERATION: 1. Release of right long finger A1 pulley. 2. Resection of myxoid cyst from A1 pulley followed by debridement of     flexor digitorum superficialis tendon, degenerative tendinopathy.  OPERATING SURGEON:  Katy Fitch. Hedaya Latendresse, M.D.  ASSISTANT:  Marveen Reeks. Dasnoit, PA-C  ANESTHESIA:  2% lidocaine flexor sheath block and field block of right palm supplemented by IV sedation.  SUPERVISING ANESTHESIOLOGIST:  Achille Rich, MD  INDICATIONS:  Catherine Harding is a 78 year old woman referred by Dr. Adrian Prince for evaluation and management of a triggering and painful right long finger.  She had a history of a mass in her palm.  This was consistent with a flexor sheath myxoid cyst.  She had locking of finger in flexion consistent with either stenosing tenosynovitis or a partial tendon rupture.  After informed consent at this time, she was brought to the operating room anticipating removal of the cyst, release of A1 pulley and tendon debridement.  After informed consent, she was brought to the operating room at this time.  PROCEDURE:  [name] was brought to room #1 of the [facility] and placed supine position on the operating table. Preoperatively, she was interviewed by Dr. Chaney Malling, who provided detailed anesthesia informed consent.  She requested local anesthesia and sedation.  In room #1 under Dr. Seward Meth direct  supervision, IV sedation was provided followed by routine Betadine scrub and paint of the right upper extremity.  A 2% lidocaine was infiltrated in the path of the intended incision and around the common digital nerves to the index, long, and ring fingers for a perioperative block.  After 5 minutes, excellent anesthesia was achieved.  Following exsanguination of the right arm with Esmarch bandage, an arterial tourniquet on the proximal right brachium was inflated to 220 mmHg.  Following routine surgical time-out, an oblique incision was fashioned directly over the mass.  Subcutaneous tissues were carefully divided taking care to identify and release of the palmar fascia.  There was a multilobular myxoid cyst growing on the A1 pulley.  This was resected piecemeal with a rongeur.  The A1 pulley was then split along its radial border to prevent ulnar deviation of the finger.  The flexor tendons were delivered and there was found to be quite a bit of degenerative fraying of the superficialis tendon.  This was cleaned with scissors and rongeur dissection to a smooth margin.  Thereafter, free range of motion of the long finger was recovered.  The wound was inspected for bleeding points followed by repair of the skin with intradermal 3-0 Prolene suture.  A Steri-Strip was applied followed by a soft  gauze dressing with Ace wrap.  There were no apparent complications.     Katy Fitch Fotini Lemus, M.D.     RVS/MEDQ  D:  07/28/2012  T:  07/29/2012  Job:  [id number]  cc:   Jeannett Senior A. Evlyn Kanner, M.D.

## 2012-08-01 ENCOUNTER — Telehealth: Payer: Self-pay | Admitting: *Deleted

## 2012-08-01 NOTE — Telephone Encounter (Signed)
Pt informed with the below note, transferred to appointment desk 

## 2012-08-01 NOTE — Telephone Encounter (Signed)
This is a continuation of the not e I just wrote. Although the estrogen patch is protective I think she needs to either discuss other medication as well with Dr. Evlyn Kanner or me. If she would like to do it with me make her an appointment.

## 2012-08-01 NOTE — Telephone Encounter (Signed)
Tell patient bone density still shows osteoporosis. All bones were stable except for her left hip which did show additional bone loss. She is getting protection from her estrogen patch.

## 2012-08-01 NOTE — Telephone Encounter (Signed)
Pt calling requesting recent dexa results. Please advise

## 2012-08-07 ENCOUNTER — Ambulatory Visit: Payer: Medicare Other | Admitting: Obstetrics and Gynecology

## 2012-08-28 ENCOUNTER — Ambulatory Visit (INDEPENDENT_AMBULATORY_CARE_PROVIDER_SITE_OTHER): Payer: Medicare Other | Admitting: Obstetrics and Gynecology

## 2012-08-28 DIAGNOSIS — M810 Age-related osteoporosis without current pathological fracture: Secondary | ICD-10-CM

## 2012-08-28 LAB — COMPREHENSIVE METABOLIC PANEL
ALT: 35 U/L (ref 0–35)
AST: 34 U/L (ref 0–37)
Creat: 0.76 mg/dL (ref 0.50–1.10)
Total Bilirubin: 0.4 mg/dL (ref 0.3–1.2)

## 2012-08-28 NOTE — Patient Instructions (Signed)
We'll discuss lab at annual exam.

## 2012-08-28 NOTE — Progress Notes (Signed)
Patient came by today to discuss her osteoporosis with me. Her last bone density was September, 2013. Her worst T score was -2.6. Her left hip showed a statistical significant loss of  bone of -7.3%. Her wrist and other Hip was stable. She takes calcium and vitamin D. She has progressing Muscular dystrophy and is prone to falling. She needs a cane to get around. She tries to walk but exercising is difficult. She took Fosamax for 15 years and then  one year of IV Reclast. She stopped the Reclast due to joint pain in her jaw. 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 She has been on drug holiday now for 3 years. She has discussed this with Dr. Evlyn Kanner who recommended Prolia. She wanted another opinion.  We had a very long discussion of all the above. I explained to her the difference between degenerative joint disease and osteonecrosis of the jaw. I told her I did not have a problem with her doing Prolia. We discussed a dental exam first. She has an appointment for that in January. I think due to the seriousness of all the above including her instability due to muscular dystrophy I. Would favor Forteo. I gave her information about Forteo. We discussed the black box warning with osteogenic sarcoma in the issues with the animal Models. Blood was drawn for appropriate lab studies. She will decide and will discuss it at her annual exam.

## 2012-08-29 LAB — PTH, INTACT AND CALCIUM
Calcium, Total (PTH): 10.1 mg/dL (ref 8.4–10.5)
PTH: 28.6 pg/mL (ref 14.0–72.0)

## 2012-08-29 LAB — VITAMIN D 25 HYDROXY (VIT D DEFICIENCY, FRACTURES): Vit D, 25-Hydroxy: 42 ng/mL (ref 30–89)

## 2012-09-18 ENCOUNTER — Other Ambulatory Visit (HOSPITAL_COMMUNITY)
Admission: RE | Admit: 2012-09-18 | Discharge: 2012-09-18 | Disposition: A | Discharge status: Home / Self Care | Payer: Medicare Other | Source: Ambulatory Visit | Attending: Obstetrics and Gynecology | Admitting: Obstetrics and Gynecology

## 2012-09-18 ENCOUNTER — Encounter: Payer: Self-pay | Admitting: Obstetrics and Gynecology

## 2012-09-18 ENCOUNTER — Ambulatory Visit (INDEPENDENT_AMBULATORY_CARE_PROVIDER_SITE_OTHER): Payer: Medicare Other | Admitting: Obstetrics and Gynecology

## 2012-09-18 VITALS — BP 130/84 | Ht 62.0 in | Wt 160.0 lb

## 2012-09-18 DIAGNOSIS — N951 Menopausal and female climacteric states: Secondary | ICD-10-CM

## 2012-09-18 DIAGNOSIS — Z1272 Encounter for screening for malignant neoplasm of vagina: Secondary | ICD-10-CM

## 2012-09-18 DIAGNOSIS — N952 Postmenopausal atrophic vaginitis: Secondary | ICD-10-CM

## 2012-09-18 DIAGNOSIS — M810 Age-related osteoporosis without current pathological fracture: Secondary | ICD-10-CM

## 2012-09-18 DIAGNOSIS — N879 Dysplasia of cervix uteri, unspecified: Secondary | ICD-10-CM | POA: Insufficient documentation

## 2012-09-18 DIAGNOSIS — D069 Carcinoma in situ of cervix, unspecified: Secondary | ICD-10-CM

## 2012-09-18 DIAGNOSIS — R232 Flushing: Secondary | ICD-10-CM

## 2012-09-18 DIAGNOSIS — Z124 Encounter for screening for malignant neoplasm of cervix: Secondary | ICD-10-CM | POA: Insufficient documentation

## 2012-09-18 NOTE — Progress Notes (Signed)
Patient came to see me today for further followup. In 1981 she had a total abdominal hysterectomy, bilateral salpingo-oophorectomy for severe cervical dysplasia. She has had normal yearly Pap smears since then. Her last Pap smear was 2011. She was placed on hormone replacement therapy which she took for many years for menopausal symptoms. Recently she became symptom-free but did not stop her estrogen patch because she was on drug holiday from Fosamax and has significant bone loss previously and was continuing to use that both for  her bone protection and vaginal dryness. At her last visit appropriate lab was done and we discussed what to do  since her bone density has worsened. She at first discussed with Dr. Evlyn Kanner. She plans to start Prolia. She will do that with him since I am retiring. She is unsure whether she should say on her  Estrogen patch or not She is having no vaginal bleeding. She is having no pelvic pain. She is having no dysuria, frequency, or urgency of urination or incontinence. She continues to see her muscular dystrophy worsen.  ROS: 12 system review done. Pertinent positives above. Other positives include macular hole left eye, hypertension and irritable bowel syndrome.  HEENT: Within normal limits.Kennon Portela present. Neck: No masses. Supraclavicular lymph nodes: Not enlarged. Breasts: Examined in both sitting and lying position. Symmetrical without skin changes or masses. Abdomen: Soft no masses guarding or rebound. No hernias. Pelvic: External within normal limits. BUS within normal limits. Vaginal examination shows good estrogen effect, no cystocele enterocele or rectocele. Cervix and uterus absent. Adnexa within normal limits. Rectovaginal confirmatory. Extremities within normal limits.  Assessment: #1. CIN-3 #2. Hot flashes #3. Osteoporosis #4. Atrophic vaginitis  Plan: Start Prolia. Pap not done.The new Pap smear guidelines were discussed with the patient. Stop estrogen  patch. Let  me know if  hot flashes recur.

## 2012-09-18 NOTE — Patient Instructions (Signed)
Stop  estrogen patch.

## 2013-05-30 ENCOUNTER — Ambulatory Visit: Payer: Self-pay | Admitting: Neurology

## 2013-07-04 ENCOUNTER — Encounter: Payer: Self-pay | Admitting: Neurology

## 2013-07-05 ENCOUNTER — Encounter: Payer: Self-pay | Admitting: Neurology

## 2013-07-05 ENCOUNTER — Ambulatory Visit (INDEPENDENT_AMBULATORY_CARE_PROVIDER_SITE_OTHER): Payer: Medicare Other | Admitting: Neurology

## 2013-07-05 VITALS — BP 122/75 | HR 74 | Resp 16 | Ht 60.0 in | Wt 158.0 lb

## 2013-07-05 DIAGNOSIS — R109 Unspecified abdominal pain: Secondary | ICD-10-CM

## 2013-07-05 DIAGNOSIS — G7109 Other specified muscular dystrophies: Secondary | ICD-10-CM

## 2013-07-05 DIAGNOSIS — G71039 Limb girdle muscular dystrophy, unspecified: Secondary | ICD-10-CM

## 2013-07-05 DIAGNOSIS — R5381 Other malaise: Secondary | ICD-10-CM

## 2013-07-05 DIAGNOSIS — M4120 Other idiopathic scoliosis, site unspecified: Secondary | ICD-10-CM

## 2013-07-05 HISTORY — DX: Other specified muscular dystrophies: G71.09

## 2013-07-05 HISTORY — DX: Limb girdle muscular dystrophy, unspecified: G71.039

## 2013-07-05 MED ORDER — ARMODAFINIL 250 MG PO TABS: 250.0000 mg | ORAL_TABLET | Freq: Two times a day (BID) | ORAL | Status: DC

## 2013-07-05 NOTE — Progress Notes (Signed)
[facility]  Provider:  Melvyn Novas, M D  Referring Provider: Julian Hy, MD Primary Care Physician:  Julian Hy, MD  Chief Complaint  Patient presents with  . Dizziness    # 11 - Follow Up   . Gait Problem    HPI:  Catherine Harding is a 79 y.o. female  Is seen here as a referral/ revisit  from Dr. Evlyn Kanner .    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   The scoliosis has led to some stiffness in her lower extremities problems to arise from a chair or from a seated position. The stiffness is worse if she remained seated for a longer period of time. She reports that just driving to town for 30 or 40 minutes would be enough to give her difficulties to arise off the chart car. In that thousand and 7 the patient underwent a EMG enough conduction study, which showed a mild peripheral neuropathy. Her neurologic symptoms are otherwise unchanged she does have a loss of balance or a poor sense of balance and she is walking with a cane to stabilize. She also had reported fatigue in the past and had tolerated  NUVIGIL at 150 mg (medication is a  good success).  She has not fallen now in over 2.5  years.  " The Pain in the mid rift is terrific and takes my breath away ". She has frequent indigestion, eats bland food, but her pain is located so low , almost pubic.  It feels like menstrual cramps. She has flank pain, too but noted no urinary problems.        Review of Systems: Out of a complete 14 system review, the patient complains of only the following symptoms, and all other reviewed systems are negative. See above ,waddling gait " penguin gait"   History   Social History  . Marital Status: Divorced    Spouse Name: N/A    Number of Children: 3  . Years of Education: 13   Occupational History  . retired     Haematologist at  [employer] History Main Topics  . Smoking status: Never Smoker   . Smokeless tobacco: Never Used  . Alcohol Use: No     Comment: rare  . Drug Use: No  . Sexual Activity: No   Other Topics Concern  . Not on file   Social History Narrative   Patient lives at home alone and she is divorced. Patient is retired.   Right handed.   Caffeine- one cup daily.    Family History  Problem Relation Age of Onset  . Hypertension Sister     Past Medical History  Diagnosis Date  . IBS (irritable bowel syndrome)   . Macular hole of left eye   . Osteoporosis     pelvic fracture  . Atrophic vaginitis   . Basal cell carcinoma   . Jaw atrophy   . Pseudocholinesterase deficiency   . Muscular dystrophy   . GERD (gastroesophageal reflux disease)   . Arthritis   . Complication of anesthesia     pseudocholinesterase deficiency-hard to wake up  . Cervical dysplasia   . Hypertension   . Scoliosis     Past Surgical History  Procedure Laterality Date  . Total abdominal hysterectomy w/ bilateral salpingoophorectomy  1981  .  Breast lumpectomy  1971    benign  . Eye surgery  2003    left  . Cesarean section      x2  . Abdominal hysterectomy      TAH BSO  . Trigger finger release  07/28/2012    Procedure: RELEASE TRIGGER FINGER/A-1 PULLEY;  Surgeon: Wyn Forster., MD;  Location: [facility];  Service: Orthopedics;  Laterality: Right;  EXCISION CYST RIGHT LONG A-1 RELEASE A-1 RIGHT LONG   . Colposcopy      Current Outpatient Prescriptions  Medication Sig Dispense Refill  . Armodafinil (NUVIGIL) 250 MG tablet Take 250 mg by mouth 2 (two) times daily.      . Artificial Tear Solution (SYSTANE CONTACTS) SOLN Apply to eye.      . bacitracin ophthalmic ointment Place 500 application into both eyes 3 (three) times daily.      . Calcium Carbonate Antacid (TUMS PO) Take by mouth daily. chewable      . Calcium Carbonate-Vit D-Min (CALTRATE PLUS PO) Take by mouth.         . denosumab (PROLIA) 60 MG/ML SOLN injection Inject 60 mg into the skin every 6 (six) months. Administer in upper arm, thigh, or abdomen      . diltiazem (CARDIZEM CD) 240 MG 24 hr capsule Take 240 mg by mouth daily.        . ergocalciferol (VITAMIN D2) 50000 UNITS capsule Take 50,000 Units by mouth once a week.        Marland Kitchen FLUTICASONE PROPIONATE, NASAL, NA Place into the nose. 2 sprays 1/daily      . lansoprazole (PREVACID) 30 MG capsule Take 30 mg by mouth daily.      Marland Kitchen losartan-hydrochlorothiazide (HYZAAR) 100-12.5 MG per tablet Take 12.5 tablets by mouth daily.      . Methylcellulose, Laxative, (CITRUCEL PO) Take by mouth. Calcium citrate+D, 2 times daily      . Multiple Vitamins-Minerals (OCUVITE PRESERVISION PO) Take by mouth.        . olmesartan-hydrochlorothiazide (BENICAR HCT) 40-12.5 MG per tablet Take 1 tablet by mouth daily.        Bertram Gala Glycol-Propyl Glycol 0.4-0.3 % SOLN Apply to eye.        . Probiotic Product (ALIGN PO) Take by mouth.        . Psyllium (METAMUCIL PO) Take by mouth daily.        . sodium chloride (AYR) 0.65 % nasal spray Place 1 spray into the nose as needed for congestion.      . Triamcinolone Acetonide (NASACORT AQ NA) Place into the nose. As needed       No current facility-administered medications for this visit.    Allergies as of 07/05/2013 - Review Complete 07/05/2013  Allergen Reaction Noted  . Anesthetics, amide  07/04/2013  . Celebrex [celecoxib]  09/08/2011  . Ciprofloxacin Swelling 09/08/2011  . Codeine Nausea And Vomiting 09/08/2011  . Morphine and related Nausea And Vomiting 09/08/2011  . Sulfa antibiotics  09/08/2011    Vitals: BP 122/75  Pulse 74  Resp 16  Ht 5' (1.524 m)  Wt 158 lb (71.668 kg)  BMI 30.86 kg/m2 Last Weight:  Wt Readings from Last 1 Encounters:  07/05/13 158 lb (71.668 kg)   Last Height:   Ht Readings from Last 1 Encounters:  07/05/13 5' (1.524 m)   Physical exam:  General: The patient is awake,  alert and appears not in acute distress. The patient is well groomed.  Head: Normocephalic, atraumatic. Neck is supple. Cardiovascular:  Regular rate and rhythm, without  murmurs or carotid bruit, and without distended neck veins. Respiratory: Lungs are clear to auscultation. Skin:  Without evidence of edema, or rash Trunk: BMI is  elevated and patient has  Severe scolisis, spinal stenosis.  Neurologic exam : The patient is awake and alert, oriented to place and time.  Memory subjective  described as intact. There is a normal attention span & concentration ability.  Speech is fluent without   dysarthria, dysphonia or aphasia. Mood and affect are appropriate.  Cranial nerves: Pupils are equal and briskly reactive to light. Funduscopic exam without  evidence of pallor or edema. Extraocular movements  in vertical and horizontal planes intact and without nystagmus. Visual fields by finger perimetry are intact. Hearing to finger rub intact.  Facial sensation intact to fine touch. Facial motor strength is symmetric and tongue and uvula move midline.  Motor exam:   Very weak hip flexion and adduction, penguin gait.  Sensory:  Fine touch, pinprick and vibration were tested in all extremities. Proprioception is  normal.  Coordination: Rapid alternating movements in the fingers/hands is tested and normal. Finger-to-nose maneuver tested and normal without evidence of ataxia, dysmetria or tremor.  Gait and station: Patient walks with a cane .  Deep tendon reflexes: in the  upper and lower extremities are  Brisk , symmetric and intact. Spasms reported,  No clonus.     Assessment:  After physical and neurologic examination, review of laboratory studies, imaging, neurophysiology testing and pre-existing records, assessment:   is that of progressive leg weakness and pain , stiffness - an overlap of Spinal  Scoliosis and stenosis with limb girdle muscle atrophy.  Her abdomninal pain needs to be evaluated  from PCP, and needs urinary sample and possible [place].   Plan. I did ask the patient to stay off Provigil for a limited number of days such as 3 or 4 just to see if and which possibly could can't attribute to her lower abdominal pain almost pelvic pain. In addition I will refill that individual for her to the has given her relief from her fatigue. In addition I would asked Dr. Evlyn Kanner to evaluate her for a urine analysis and possible ultrasound of the lower abdomen.

## 2013-07-17 ENCOUNTER — Other Ambulatory Visit: Payer: Self-pay | Admitting: Dermatology

## 2014-05-28 ENCOUNTER — Encounter: Payer: Self-pay | Admitting: *Deleted

## 2014-05-29 ENCOUNTER — Ambulatory Visit: Payer: Self-pay | Admitting: Nurse Practitioner

## 2014-05-29 ENCOUNTER — Telehealth: Payer: Self-pay | Admitting: Nurse Practitioner

## 2014-05-29 NOTE — Telephone Encounter (Signed)
Patient was no show for today's office appointment.  

## 2014-05-31 NOTE — Telephone Encounter (Signed)
This encounter was created in error - please disregard.

## 2014-06-05 ENCOUNTER — Telehealth: Payer: Self-pay | Admitting: *Deleted

## 2014-06-05 NOTE — Telephone Encounter (Signed)
Pt called and thought she had appt today at 1000.  I told her that I did not see this.  Last appt 07-03-14 was cancelled due to CM/NP schedule change.  Call pt back to reschedule.  Thanks.

## 2014-06-05 NOTE — Telephone Encounter (Signed)
Spoke to patient and will come in Friday to see Cm.

## 2014-06-07 ENCOUNTER — Ambulatory Visit (INDEPENDENT_AMBULATORY_CARE_PROVIDER_SITE_OTHER): Payer: Medicare Other | Admitting: Nurse Practitioner

## 2014-06-07 ENCOUNTER — Encounter: Payer: Self-pay | Admitting: Nurse Practitioner

## 2014-06-07 VITALS — BP 138/76 | HR 96 | Ht 60.0 in | Wt 161.2 lb

## 2014-06-07 DIAGNOSIS — G7100 Muscular dystrophy, unspecified: Secondary | ICD-10-CM

## 2014-06-07 DIAGNOSIS — G71039 Limb girdle muscular dystrophy, unspecified: Secondary | ICD-10-CM

## 2014-06-07 DIAGNOSIS — G7109 Other specified muscular dystrophies: Secondary | ICD-10-CM

## 2014-06-07 NOTE — Patient Instructions (Signed)
No change in plan of care F/U in 1 year

## 2014-06-07 NOTE — Progress Notes (Signed)
[facility]  [name] DOB: 1930-07-17   REASON FOR VISIT: Followup for gait abnormality   HISTORY OF PRESENT ILLNESS: Catherine Harding, 80 year old female returns for followup. She was last seen by Dr. Brett Fairy 07/05/2013 She has a history of  limb girdle muscular dystrophy as well as a secondary scoliosis. The scoliosis has led to some stiffness in her lower extremities problems to arise from a chair or from a seated position. The stiffness is worse if she remained seated for a longer period of time. She reports that just driving to town for 30 or 40 minutes would be enough to give her difficulties to arise.In 2007 patient underwent a EMG enough conduction study, which showed a mild peripheral neuropathy. Her neurologic symptoms are otherwise unchanged she does have a loss of balance or a poor sense of balance and she is walking with a cane to stabilize. She reports that she has had 3 falls in the last year and all occurred when she was  not using her cane . She returns for reevaluation    REVIEW OF SYSTEMS: Full 14 system review of systems performed and notable only for those listed, all others are neg:  Constitutional: Occasional fatigue Cardiovascular: N/A  Ear/Nose/Throat: N/A  Skin: N/A  Eyes: N/A  Respiratory: N/A  Gastroitestinal: N/A  Hematology/Lymphatic: N/A  Endocrine: N/A Musculoskeletal: Muscle cramps, walking difficulty  Allergy/Immunology: N/A  Neurological: Weakness Psychiatric: N/A Sleep : NA   ALLERGIES: Allergies  Allergen Reactions  . Anesthetics, Amide   . Celebrex [Celecoxib]   . Ciprofloxacin Swelling  . Codeine Nausea And Vomiting  . Morphine And Related Nausea And Vomiting  . Sulfa Antibiotics     HOME MEDICATIONS: Outpatient Prescriptions Prior to Visit  Medication Sig Dispense Refill  . Artificial Tear Solution (SYSTANE CONTACTS) SOLN Apply to eye.      . bacitracin ophthalmic ointment Place 580 application  into both eyes 3 (three) times daily.      . Calcium Carbonate Antacid (TUMS PO) Take by mouth daily. chewable      . Calcium Carbonate-Vit D-Min (CALTRATE PLUS PO) Take by mouth.        . denosumab (PROLIA) 60 MG/ML SOLN injection Inject 60 mg into the skin every 6 (six) months. Administer in upper arm, thigh, or abdomen      . diltiazem (CARDIZEM CD) 240 MG 24 hr capsule Take 240 mg by mouth daily.        . ergocalciferol (VITAMIN D2) 50000 UNITS capsule Take 50,000 Units by mouth once a week.        Marland Kitchen FLUTICASONE PROPIONATE, NASAL, NA Place into the nose. 2 sprays 1/daily      . lansoprazole (PREVACID) 30 MG capsule Take 30 mg by mouth daily.      Marland Kitchen losartan-hydrochlorothiazide (HYZAAR) 100-12.5 MG per tablet Take 12.5 tablets by mouth daily.      . Methylcellulose, Laxative, (CITRUCEL PO) Take by mouth. Calcium citrate+D, 2 times daily      . Multiple Vitamins-Minerals (OCUVITE PRESERVISION PO) Take by mouth.        . olmesartan-hydrochlorothiazide (BENICAR HCT) 40-12.5 MG per tablet Take 1 tablet by mouth daily.        Catherine Harding Glycol-Propyl Glycol 0.4-0.3 % SOLN Apply to eye.        . Probiotic Product (ALIGN PO) Take by mouth.        . Psyllium (METAMUCIL PO) Take by mouth daily.        Marland Kitchen  sodium chloride (AYR) 0.65 % nasal spray Place 1 spray into the nose as needed for congestion.      . Triamcinolone Acetonide (NASACORT AQ NA) Place into the nose. As needed      . Armodafinil (NUVIGIL) 250 MG tablet Take 1 tablet (250 mg total) by mouth 2 (two) times daily.  60 tablet  5   No facility-administered medications prior to visit.    PAST MEDICAL HISTORY: Past Medical History  Diagnosis Date  . IBS (irritable bowel syndrome)   . Macular hole of left eye   . Osteoporosis     pelvic fracture  . Atrophic vaginitis   . Basal cell carcinoma   . Jaw atrophy   . Pseudocholinesterase deficiency   . Muscular dystrophy   . GERD (gastroesophageal reflux disease)   . Arthritis   .  Complication of anesthesia     pseudocholinesterase deficiency-hard to wake up  . Cervical dysplasia   . Hypertension   . Scoliosis   . Limb-girdle muscular dystrophy 07/05/2013    PAST SURGICAL HISTORY: Past Surgical History  Procedure Laterality Date  . Total abdominal hysterectomy w/ bilateral salpingoophorectomy  1981  . Breast lumpectomy  1971    benign  . Eye surgery  2003    left  . Cesarean section      x2  . Abdominal hysterectomy      TAH BSO  . Trigger finger release  07/28/2012    Procedure: RELEASE TRIGGER FINGER/A-1 PULLEY;  Surgeon: Cammie Sickle., MD;  Location: [facility];  Service: Orthopedics;  Laterality: Right;  EXCISION CYST RIGHT LONG A-1 RELEASE A-1 RIGHT LONG   . Colposcopy      FAMILY HISTORY: Family History  Problem Relation Age of Onset  . Hypertension Sister     SOCIAL HISTORY: History   Social History  . Marital Status: Divorced    Spouse Name: N/A    Number of Children: 3  . Years of Education: 13   Occupational History  . retired     Theatre stage manager at [employer]  . Smoking status: Never Smoker   . Smokeless tobacco: Never Used  . Alcohol Use: No     Comment: rare  . Drug Use: No  . Sexual Activity: No   Other Topics Concern  . Not on file   Social History Narrative   Patient lives at home alone and she is divorced. Patient is retired.   Right handed.   Caffeine- one cup daily.     PHYSICAL EXAM  Filed Vitals:   06/07/14 1349  BP: 138/76  Pulse: 96  Height: 5' (1.524 m)  Weight: 161 lb 3.2 oz (73.12 kg)   Body mass index is 31.48 kg/(m^2). General: The patient is awake, alert and appears not in acute distress. The patient is well groomed.  Head: Normocephalic, atraumatic.  Neck is supple.  Cardiovascular: Regular rate and rhythm, without murmurs or carotid bruit, and without distended neck veins.  Respiratory: Lungs are clear to auscultation.  Skin: Without  evidence of edema, or rash  Trunk:  Severe scolisis, spinal stenosis.  Neurologic exam :  The patient is awake and alert, oriented to place and time. Memory subjective described as intact. There is a normal attention span & concentration ability.  Speech is fluent without dysarthria, dysphonia or aphasia. Mood and affect are appropriate.  Cranial nerves:  Pupils are equal and briskly reactive to light.  Extraocular movements  in vertical and horizontal planes intact and without nystagmus. Visual fields by finger perimetry are intact.  Hearing to finger rub intact. Facial sensation intact to fine touch. Facial motor strength is symmetric and tongue and uvula move midline.  Motor exam: Very weak hip flexion and adduction,  Sensory: Fine touch, pinprick and vibration were tested in all extremities. Proprioception is normal.  Coordination: Rapid alternating movements in the fingers/hands is tested and normal. Finger-to-nose maneuver tested and normal without evidence of ataxia, dysmetria or tremor.  Gait and station: Patient walks with a cane . penguin gait.  Deep tendon reflexes: in the upper and lower extremities are Brisk , symmetric and intact.  No clonus.   DIAGNOSTIC DATA (LABS, IMAGING, TESTING) -  ASSESSMENT AND PLAN  80 y.o. year old female  has a past medical history of IBS (irritable bowel syndrome); Macular hole of left eye; Osteoporosis;  Muscular dystrophy;  Arthritis; and limb girdle muscular dystrophy.  No change in plan of care F/U in 1 year Dennie Bible, [facility], [facility], [facility] [address] [place], [place] [address] [phone]

## 2014-06-10 NOTE — Progress Notes (Signed)
I agree with the assessment and plan as directed by NP .The patient is known to me .   Andre Swander, MD  

## 2014-07-03 ENCOUNTER — Ambulatory Visit: Payer: BC Managed Care – PPO | Admitting: Nurse Practitioner

## 2014-08-12 ENCOUNTER — Encounter: Payer: Self-pay | Admitting: Nurse Practitioner

## 2014-08-21 ENCOUNTER — Encounter: Payer: Self-pay | Admitting: Neurology

## 2014-08-21 ENCOUNTER — Telehealth: Payer: Self-pay | Admitting: Neurology

## 2014-08-21 NOTE — Telephone Encounter (Signed)
Printed and mailed letter with adjusted appointment time for 06/10/15 per Dr. Edwena Felty schedule.

## 2014-08-28 ENCOUNTER — Encounter: Payer: Self-pay | Admitting: Neurology

## 2014-09-03 ENCOUNTER — Encounter: Payer: Self-pay | Admitting: Neurology

## 2015-06-05 ENCOUNTER — Encounter (HOSPITAL_COMMUNITY): Payer: Self-pay

## 2015-06-05 ENCOUNTER — Other Ambulatory Visit (HOSPITAL_COMMUNITY): Payer: Self-pay | Admitting: Endocrinology

## 2015-06-05 ENCOUNTER — Ambulatory Visit (HOSPITAL_COMMUNITY)
Admission: RE | Admit: 2015-06-05 | Discharge: 2015-06-05 | Disposition: A | Discharge status: Home / Self Care | Payer: Medicare Other | Source: Ambulatory Visit | Attending: Endocrinology | Admitting: Endocrinology

## 2015-06-05 DIAGNOSIS — M810 Age-related osteoporosis without current pathological fracture: Secondary | ICD-10-CM | POA: Diagnosis not present

## 2015-06-05 MED ORDER — DENOSUMAB 60 MG/ML ~~LOC~~ SOLN
60.0000 mg | Freq: Once | SUBCUTANEOUS | Status: AC
  Administered 2015-06-05: 60 mg via SUBCUTANEOUS
  Filled 2015-06-05: qty 1

## 2015-06-05 NOTE — Progress Notes (Signed)
Pt states she is currently taking a daily supplement of vitamin D and calcium.  D/c paperwork on prolia given to pt and explained.  Next appointment for prolia given for 12/09/15 1100.

## 2015-06-05 NOTE — Discharge Instructions (Signed)
Denosumab injection What is this medicine? DENOSUMAB (den oh sue mab) slows bone breakdown. Prolia is used to treat osteoporosis in women after menopause and in men. Xgeva is used to prevent bone fractures and other bone problems caused by cancer bone metastases. Xgeva is also used to treat giant cell tumor of the bone. This medicine may be used for other purposes; ask your health care provider or pharmacist if you have questions. COMMON BRAND NAME(S): Prolia, XGEVA What should I tell my health care provider before I take this medicine? They need to know if you have any of these conditions: -dental disease -eczema -infection or history of infections -kidney disease or on dialysis -low blood calcium or vitamin D -malabsorption syndrome -scheduled to have surgery or tooth extraction -taking medicine that contains denosumab -thyroid or parathyroid disease -an unusual reaction to denosumab, other medicines, foods, dyes, or preservatives -pregnant or trying to get pregnant -breast-feeding How should I use this medicine? This medicine is for injection under the skin. It is given by a health care professional in a hospital or clinic setting. If you are getting Prolia, a special MedGuide will be given to you by the pharmacist with each prescription and refill. Be sure to read this information carefully each time. For Prolia, talk to your pediatrician regarding the use of this medicine in children. Special care may be needed. For Xgeva, talk to your pediatrician regarding the use of this medicine in children. While this drug may be prescribed for children as young as 13 years for selected conditions, precautions do apply. Overdosage: If you think you've taken too much of this medicine contact a poison control center or emergency room at once. Overdosage: If you think you have taken too much of this medicine contact a poison control center or emergency room at once. NOTE: This medicine is only for  you. Do not share this medicine with others. What if I miss a dose? It is important not to miss your dose. Call your doctor or health care professional if you are unable to keep an appointment. What may interact with this medicine? Do not take this medicine with any of the following medications: -other medicines containing denosumab This medicine may also interact with the following medications: -medicines that suppress the immune system -medicines that treat cancer -steroid medicines like prednisone or cortisone This list may not describe all possible interactions. Give your health care provider a list of all the medicines, herbs, non-prescription drugs, or dietary supplements you use. Also tell them if you smoke, drink alcohol, or use illegal drugs. Some items may interact with your medicine. What should I watch for while using this medicine? Visit your doctor or health care professional for regular checks on your progress. Your doctor or health care professional may order blood tests and other tests to see how you are doing. Call your doctor or health care professional if you get a cold or other infection while receiving this medicine. Do not treat yourself. This medicine may decrease your body's ability to fight infection. You should make sure you get enough calcium and vitamin D while you are taking this medicine, unless your doctor tells you not to. Discuss the foods you eat and the vitamins you take with your health care professional. See your dentist regularly. Brush and floss your teeth as directed. Before you have any dental work done, tell your dentist you are receiving this medicine. Do not become pregnant while taking this medicine or for 5 months after stopping   it. Women should inform their doctor if they wish to become pregnant or think they might be pregnant. There is a potential for serious side effects to an unborn child. Talk to your health care professional or pharmacist for more  information. What side effects may I notice from receiving this medicine? Side effects that you should report to your doctor or health care professional as soon as possible: -allergic reactions like skin rash, itching or hives, swelling of the face, lips, or tongue -breathing problems -chest pain -fast, irregular heartbeat -feeling faint or lightheaded, falls -fever, chills, or any other sign of infection -muscle spasms, tightening, or twitches -numbness or tingling -skin blisters or bumps, or is dry, peels, or red -slow healing or unexplained pain in the mouth or jaw -unusual bleeding or bruising Side effects that usually do not require medical attention (Report these to your doctor or health care professional if they continue or are bothersome.): -muscle pain -stomach upset, gas This list may not describe all possible side effects. Call your doctor for medical advice about side effects. You may report side effects to FDA at 1-800-FDA-1088. Where should I keep my medicine? This medicine is only given in a clinic, doctor's office, or other health care setting and will not be stored at home. NOTE: This sheet is a summary. It may not cover all possible information. If you have questions about this medicine, talk to your doctor, pharmacist, or health care provider.  2015, Elsevier/Gold Standard. (2012-03-27 12:37:47)  

## 2015-06-10 ENCOUNTER — Ambulatory Visit: Payer: Medicare Other | Admitting: Neurology

## 2015-06-10 ENCOUNTER — Encounter: Payer: Self-pay | Admitting: Neurology

## 2015-06-10 ENCOUNTER — Ambulatory Visit (INDEPENDENT_AMBULATORY_CARE_PROVIDER_SITE_OTHER): Payer: Medicare Other | Admitting: Neurology

## 2015-06-10 VITALS — BP 124/70 | HR 88 | Resp 20 | Ht 62.0 in | Wt 157.0 lb

## 2015-06-10 DIAGNOSIS — M4120 Other idiopathic scoliosis, site unspecified: Secondary | ICD-10-CM | POA: Insufficient documentation

## 2015-06-10 DIAGNOSIS — M5430 Sciatica, unspecified side: Secondary | ICD-10-CM | POA: Insufficient documentation

## 2015-06-10 DIAGNOSIS — G710 Muscular dystrophy: Secondary | ICD-10-CM

## 2015-06-10 DIAGNOSIS — G71039 Limb girdle muscular dystrophy, unspecified: Secondary | ICD-10-CM

## 2015-06-10 DIAGNOSIS — M5431 Sciatica, right side: Secondary | ICD-10-CM

## 2015-06-10 DIAGNOSIS — G7109 Other specified muscular dystrophies: Secondary | ICD-10-CM

## 2015-06-10 MED ORDER — TIZANIDINE HCL 2 MG PO CAPS: 2.0000 mg | ORAL_CAPSULE | Freq: Every evening | ORAL | Status: DC

## 2015-06-10 NOTE — Progress Notes (Signed)
[facility]  [name] DOB: May 07, 1930   REASON FOR VISIT: Followup for gait abnormality   HISTORY OF PRESENT ILLNESS: Ms. 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  She was last seen by me on 07/05/2013. 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 The scoliosis has led to some stiffness in her lower extremities problems to arise from a chair or from a seated position. The stiffness is worse if she remained seated for a longer period of time. She reports that just driving to town for 30 or 40 minutes would be enough to have problems. She is however still driving, her problem is to enter and leave the car.  .In 2007 this established patient underwent a EMG and nerve conduction study, which showed only  a mild peripheral neuropathy. Her neurologic symptoms are otherwise unchanged- she has the expected progression of gait difficulties,  she does have a loss of balance /poor sense of balance and she is walking with a walker to stabilize. She reports using a cane at home. Within the boundaries of her home she can maneuver by holding onto the wall or furniture if needed. She does not use an assistive device per se indoors. She reports sciatic nerve pain. Hip pain- treated with meloxicam and prednisone  But only found relief after sterid injections.    she has had 3 falls in the 2014 , but none in 2015- and all occurred when she was not using her cane outdoors.  She returns for reevaluation.    REVIEW OF SYSTEMS: Full 14 system review of systems performed and notable only for those listed, all others are neg:  Constitutional: Occasional fatigue Musculoskeletal: Muscle cramps, walking difficulty, scoliosis, sciatica .  Allergy/Immunology: N/A  Neurological: Weakness of gait, limb girdle distribution,"  penguin gait "    ALLERGIES: Allergies    Allergen Reactions  . Anesthetics, Amide   . Celebrex [Celecoxib]   . Ciprofloxacin Swelling  . Codeine Nausea And Vomiting  . Morphine And Related Nausea And Vomiting  . Sulfa Antibiotics     HOME MEDICATIONS: Outpatient Prescriptions Prior to Visit  Medication Sig Dispense Refill  . Artificial Tear Solution (SYSTANE CONTACTS) SOLN Apply to eye.    . bacitracin ophthalmic ointment Place 086 application into both eyes 3 (three) times daily.    . Calcium Carbonate Antacid (TUMS PO) Take by mouth daily. chewable    . Calcium Carbonate-Vit D-Min (CALTRATE PLUS PO) Take by mouth.      . denosumab (PROLIA) 60 MG/ML SOLN injection Inject 60 mg into the skin every 6 (six) months. Administer in upper arm, thigh, or abdomen    . diltiazem (CARDIZEM CD) 240 MG 24 hr capsule Take 240 mg by mouth daily.      . ergocalciferol (VITAMIN D2) 50000 UNITS capsule Take 50,000 Units by mouth once a week.      Marland Kitchen FLUTICASONE PROPIONATE, NASAL, NA Place into the nose. 2 sprays 1/daily    . lansoprazole (PREVACID) 30 MG capsule Take 30 mg by mouth daily.    Marland Kitchen losartan-hydrochlorothiazide (HYZAAR) 100-12.5 MG per tablet Take 12.5 tablets by mouth daily.    . Methylcellulose, Laxative, (CITRUCEL PO) Take by mouth. Calcium citrate+D, 2 times daily    . Multiple Vitamins-Minerals (OCUVITE PRESERVISION PO) Take by mouth.      . nabumetone (RELAFEN) 500 MG tablet     .  Polyethyl Glycol-Propyl Glycol 0.4-0.3 % SOLN Apply to eye.      . Probiotic Product (ALIGN PO) Take by mouth.      . Psyllium (METAMUCIL PO) Take by mouth daily.      . sodium chloride (AYR) 0.65 % nasal spray Place 1 spray into the nose as needed for congestion.    . Triamcinolone Acetonide (NASACORT AQ NA) Place into the nose. As needed    . olmesartan-hydrochlorothiazide (BENICAR HCT) 40-12.5 MG per tablet Take 1 tablet by mouth daily.       No facility-administered medications prior to visit.    PAST MEDICAL HISTORY: Past Medical History   Diagnosis Date  . IBS (irritable bowel syndrome)   . Macular hole of left eye   . Osteoporosis     pelvic fracture  . Atrophic vaginitis   . Basal cell carcinoma   . Jaw atrophy   . Pseudocholinesterase deficiency   . Muscular dystrophy   . GERD (gastroesophageal reflux disease)   . Arthritis   . Complication of anesthesia     pseudocholinesterase deficiency-hard to wake up  . Cervical dysplasia   . Hypertension   . Scoliosis   . Limb-girdle muscular dystrophy 07/05/2013    PAST SURGICAL HISTORY: Past Surgical History  Procedure Laterality Date  . Total abdominal hysterectomy w/ bilateral salpingoophorectomy  1981  . Breast lumpectomy  1971    benign  . Eye surgery  2003    left  . Cesarean section      x2  . Abdominal hysterectomy      TAH BSO  . Trigger finger release  07/28/2012    Procedure: RELEASE TRIGGER FINGER/A-1 PULLEY;  Surgeon: Cammie Sickle., MD;  Location: [facility];  Service: Orthopedics;  Laterality: Right;  EXCISION CYST RIGHT LONG A-1 RELEASE A-1 RIGHT LONG   . Colposcopy      FAMILY HISTORY: Family History  Problem Relation Age of Onset  . Hypertension Sister     SOCIAL HISTORY: Social History   Social History  . Marital Status: Divorced    Spouse Name: N/A  . Number of Children: 3  . Years of Education: 13   Occupational History  . retired     Theatre stage manager at [employer]  . Smoking status: Never Smoker   . Smokeless tobacco: Never Used  . Alcohol Use: No     Comment: rare  . Drug Use: No  . Sexual Activity: No   Other Topics Concern  . Not on file   Social History Narrative   Patient lives at home alone and she is divorced. Patient is retired.   Right handed.   Caffeine- one cup daily.     PHYSICAL EXAM  Filed Vitals:   06/10/15 1447  BP: 124/70  Pulse: 88  Resp: 20  Height: 5\' 2"  (1.575 m)  Weight: 157 lb (71.215 kg)   Body mass index is 28.71  kg/(m^2). General: The patient is awake, alert and appears not in acute distress. The patient is well groomed.  Head: Normocephalic, atraumatic.  Neck is supple.  Cardiovascular: Regular rate and rhythm, without murmurs or carotid bruit, and without distended neck veins.  Respiratory: Lungs are clear to auscultation.  Skin: Without evidence of edema, or rash  Trunk:  Severe scolisis, spinal stenosis.  Neurologic exam :  The patient is awake and alert, oriented to place and time. Memory subjective described as intact.  There is a  normal attention span & concentration ability.  Speech is fluent without dysarthria, dysphonia or aphasia. Mood and affect are appropriate.  Cranial nerves:  No change in taste and smell. Pupils are equal and briskly reactive to light.   Extraocular movements in vertical and horizontal planes intact and without nystagmus. Visual fields by finger perimetry are intact.  Hearing to finger rub intact. Facial sensation intact to fine touch. Facial motor strength is symmetric and tongue and uvula move midline.  Motor exam: Very weak hip flexion and adduction,  Sensory: Fine touch, pinprick and vibration were affected in the feet  Coordination: Rapid alternating movements in the fingers/hands is normal. Finger-to-nose maneuver tested and normal without evidence of ataxia, dysmetria or tremor.  Gait and station: Patient walks with a walker - penguin gait, as named by her 78 year old grandson in 2007. Deep tendon reflexes: in the upper and lower extremities are Brisk , symmetric and intact.  No clonus.   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  The patient has a very wide-based gait and would be at high fall risk.  ASSESSMENT AND PLAN  Patients sister was  never diagnosed with LG-MD, but died wheelchair bound at age 67.  The patient has a history of walking cold" funny for probably over a decade may be to. But she was only diagnosed after presenting to the neurology office 9 years ago. There is a family history as her sister was probably affected by the same dystrophy type. None of her children have shown any symptoms at this time and her grandchildren are unaffected also.  Two of her children have  scoliosis , which can be an early symptom. She does report left-sided sciatica and she has a very palpable tender spot at L5 L4. The patella reflex is preserved. She found some relief after an injection. I would like for her to have physical therapy and perhaps massage therapy. Will refer to PT , she had not been seen by neuro rehab.   81 y.o. year old female has irritable bowel syndrome,  Macular hole of left eye,  Osteoporosis   LGMD type Muscular dystrophy,  Scoliosis . Sciatica .  F/U in 1 year With Dennie Bible, [facility] [address], [place], [place] [address] [phone]

## 2015-06-10 NOTE — Patient Instructions (Signed)
Sciatica with Rehab The sciatic nerve runs from the back down the leg and is responsible for sensation and control of the muscles in the back (posterior) side of the thigh, lower leg, and foot. Sciatica is a condition that is characterized by inflammation of this nerve.  SYMPTOMS   Signs of nerve damage, including numbness and/or weakness along the posterior side of the lower extremity.  Pain in the back of the thigh that may also travel down the leg.  Pain that worsens when sitting for long periods of time.  Occasionally, pain in the back or buttock. CAUSES  Inflammation of the sciatic nerve is the cause of sciatica. The inflammation is due to something irritating the nerve. Common sources of irritation include:  Sitting for long periods of time.  Direct trauma to the nerve.  Arthritis of the spine.  Herniated or ruptured disk.  Slipping of the vertebrae (spondylolisthesis).  Pressure from soft tissues, such as muscles or ligament-like tissue (fascia). RISK INCREASES WITH:  Sports that place pressure or stress on the spine (football or weightlifting).  Poor strength and flexibility.  Failure to warm up properly before activity.  Family history of low back pain or disk disorders.  Previous back injury or surgery.  Poor body mechanics, especially when lifting, or poor posture. PREVENTION   Warm up and stretch properly before activity.  Maintain physical fitness:  Strength, flexibility, and endurance.  Cardiovascular fitness.  Learn and use proper technique, especially with posture and lifting. When possible, have coach correct improper technique.  Avoid activities that place stress on the spine. PROGNOSIS If treated properly, then sciatica usually resolves within 6 weeks. However, occasionally surgery is necessary.  RELATED COMPLICATIONS   Permanent nerve damage, including pain, numbness, tingle, or weakness.  Chronic back pain.  Risks of surgery: infection,  bleeding, nerve damage, or damage to surrounding tissues. TREATMENT Treatment initially involves resting from any activities that aggravate your symptoms. The use of ice and medication may help reduce pain and inflammation. The use of strengthening and stretching exercises may help reduce pain with activity. These exercises may be performed at home or with referral to a therapist. A therapist may recommend further treatments, such as transcutaneous electronic nerve stimulation (TENS) or ultrasound. Your caregiver may recommend corticosteroid injections to help reduce inflammation of the sciatic nerve. If symptoms persist despite non-surgical (conservative) treatment, then surgery may be recommended. MEDICATION  If pain medication is necessary, then nonsteroidal anti-inflammatory medications, such as aspirin and ibuprofen, or other minor pain relievers, such as acetaminophen, are often recommended.  Do not take pain medication for 7 days before surgery.  Prescription pain relievers may be given if deemed necessary by your caregiver. Use only as directed and only as much as you need.  Ointments applied to the skin may be helpful.  Corticosteroid injections may be given by your caregiver. These injections should be reserved for the most serious cases, because they may only be given a certain number of times. HEAT AND COLD  Cold treatment (icing) relieves pain and reduces inflammation. Cold treatment should be applied for 10 to 15 minutes every 2 to 3 hours for inflammation and pain and immediately after any activity that aggravates your symptoms. Use ice packs or massage the area with a piece of ice (ice massage).  Heat treatment may be used prior to performing the stretching and strengthening activities prescribed by your caregiver, physical therapist, or athletic trainer. Use a heat pack or soak the injury in warm water.   SEEK MEDICAL CARE IF:  Treatment seems to offer no benefit, or the condition  worsens.  Any medications produce adverse side effects. EXERCISES  RANGE OF MOTION (ROM) AND STRETCHING EXERCISES - Sciatica Most people with sciatic will find that their symptoms worsen with either excessive bending forward (flexion) or arching at the low back (extension). The exercises which will help resolve your symptoms will focus on the opposite motion. Your physician, physical therapist or athletic trainer will help you determine which exercises will be most helpful to resolve your low back pain. Do not complete any exercises without first consulting with your clinician. Discontinue any exercises which worsen your symptoms until you speak to your clinician. If you have pain, numbness or tingling which travels down into your buttocks, leg or foot, the goal of the therapy is for these symptoms to move closer to your back and eventually resolve. Occasionally, these leg symptoms will get better, but your low back pain may worsen; this is typically an indication of progress in your rehabilitation. Be certain to be very alert to any changes in your symptoms and the activities in which you participated in the 24 hours prior to the change. Sharing this information with your clinician will allow him/her to most efficiently treat your condition. These exercises may help you when beginning to rehabilitate your injury. Your symptoms may resolve with or without further involvement from your physician, physical therapist or athletic trainer. While completing these exercises, remember:   Restoring tissue flexibility helps normal motion to return to the joints. This allows healthier, less painful movement and activity.  An effective stretch should be held for at least 30 seconds.  A stretch should never be painful. You should only feel a gentle lengthening or release in the stretched tissue. FLEXION RANGE OF MOTION AND STRETCHING EXERCISES: STRETCH - Flexion, Single Knee to Chest   Lie on a firm bed or floor  with both legs extended in front of you.  Keeping one leg in contact with the floor, bring your opposite knee to your chest. Hold your leg in place by either grabbing behind your thigh or at your knee.  Pull until you feel a gentle stretch in your low back. Hold __________ seconds.  Slowly release your grasp and repeat the exercise with the opposite side. Repeat __________ times. Complete this exercise __________ times per day.  STRETCH - Flexion, Double Knee to Chest  Lie on a firm bed or floor with both legs extended in front of you.  Keeping one leg in contact with the floor, bring your opposite knee to your chest.  Tense your stomach muscles to support your back and then lift your other knee to your chest. Hold your legs in place by either grabbing behind your thighs or at your knees.  Pull both knees toward your chest until you feel a gentle stretch in your low back. Hold __________ seconds.  Tense your stomach muscles and slowly return one leg at a time to the floor. Repeat __________ times. Complete this exercise __________ times per day.  STRETCH - Low Trunk Rotation   Lie on a firm bed or floor. Keeping your legs in front of you, bend your knees so they are both pointed toward the ceiling and your feet are flat on the floor.  Extend your arms out to the side. This will stabilize your upper body by keeping your shoulders in contact with the floor.  Gently and slowly drop both knees together to one side until   you feel a gentle stretch in your low back. Hold for __________ seconds.  Tense your stomach muscles to support your low back as you bring your knees back to the starting position. Repeat the exercise to the other side. Repeat __________ times. Complete this exercise __________ times per day  EXTENSION RANGE OF MOTION AND FLEXIBILITY EXERCISES: STRETCH - Extension, Prone on Elbows  Lie on your stomach on the floor, a bed will be too soft. Place your palms about shoulder  width apart and at the height of your head.  Place your elbows under your shoulders. If this is too painful, stack pillows under your chest.  Allow your body to relax so that your hips drop lower and make contact more completely with the floor.  Hold this position for __________ seconds.  Slowly return to lying flat on the floor. Repeat __________ times. Complete this exercise __________ times per day.  RANGE OF MOTION - Extension, Prone Press Ups  Lie on your stomach on the floor, a bed will be too soft. Place your palms about shoulder width apart and at the height of your head.  Keeping your back as relaxed as possible, slowly straighten your elbows while keeping your hips on the floor. You may adjust the placement of your hands to maximize your comfort. As you gain motion, your hands will come more underneath your shoulders.  Hold this position __________ seconds.  Slowly return to lying flat on the floor. Repeat __________ times. Complete this exercise __________ times per day.  STRENGTHENING EXERCISES - Sciatica  These exercises may help you when beginning to rehabilitate your injury. These exercises should be done near your "sweet spot." This is the neutral, low-back arch, somewhere between fully rounded and fully arched, that is your least painful position. When performed in this safe range of motion, these exercises can be used for people who have either a flexion or extension based injury. These exercises may resolve your symptoms with or without further involvement from your physician, physical therapist or athletic trainer. While completing these exercises, remember:   Muscles can gain both the endurance and the strength needed for everyday activities through controlled exercises.  Complete these exercises as instructed by your physician, physical therapist or athletic trainer. Progress with the resistance and repetition exercises only as your caregiver advises.  You may  experience muscle soreness or fatigue, but the pain or discomfort you are trying to eliminate should never worsen during these exercises. If this pain does worsen, stop and make certain you are following the directions exactly. If the pain is still present after adjustments, discontinue the exercise until you can discuss the trouble with your clinician. STRENGTHENING - Deep Abdominals, Pelvic Tilt   Lie on a firm bed or floor. Keeping your legs in front of you, bend your knees so they are both pointed toward the ceiling and your feet are flat on the floor.  Tense your lower abdominal muscles to press your low back into the floor. This motion will rotate your pelvis so that your tail bone is scooping upwards rather than pointing at your feet or into the floor.  With a gentle tension and even breathing, hold this position for __________ seconds. Repeat __________ times. Complete this exercise __________ times per day.  STRENGTHENING - Abdominals, Crunches   Lie on a firm bed or floor. Keeping your legs in front of you, bend your knees so they are both pointed toward the ceiling and your feet are flat on the   floor. Cross your arms over your chest.  Slightly tip your chin down without bending your neck.  Tense your abdominals and slowly lift your trunk high enough to just clear your shoulder blades. Lifting higher can put excessive stress on the low back and does not further strengthen your abdominal muscles.  Control your return to the starting position. Repeat __________ times. Complete this exercise __________ times per day.  STRENGTHENING - Quadruped, Opposite UE/LE Lift  Assume a hands and knees position on a firm surface. Keep your hands under your shoulders and your knees under your hips. You may place padding under your knees for comfort.  Find your neutral spine and gently tense your abdominal muscles so that you can maintain this position. Your shoulders and hips should form a rectangle  that is parallel with the floor and is not twisted.  Keeping your trunk steady, lift your right hand no higher than your shoulder and then your left leg no higher than your hip. Make sure you are not holding your breath. Hold this position __________ seconds.  Continuing to keep your abdominal muscles tense and your back steady, slowly return to your starting position. Repeat with the opposite arm and leg. Repeat __________ times. Complete this exercise __________ times per day.  STRENGTHENING - Abdominals and Quadriceps, Straight Leg Raise   Lie on a firm bed or floor with both legs extended in front of you.  Keeping one leg in contact with the floor, bend the other knee so that your foot can rest flat on the floor.  Find your neutral spine, and tense your abdominal muscles to maintain your spinal position throughout the exercise.  Slowly lift your straight leg off the floor about 6 inches for a count of 15, making sure to not hold your breath.  Still keeping your neutral spine, slowly lower your leg all the way to the floor. Repeat this exercise with each leg __________ times. Complete this exercise __________ times per day. POSTURE AND BODY MECHANICS CONSIDERATIONS - Sciatica Keeping correct posture when sitting, standing or completing your activities will reduce the stress put on different body tissues, allowing injured tissues a chance to heal and limiting painful experiences. The following are general guidelines for improved posture. Your physician or physical therapist will provide you with any instructions specific to your needs. While reading these guidelines, remember:  The exercises prescribed by your provider will help you have the flexibility and strength to maintain correct postures.  The correct posture provides the optimal environment for your joints to work. All of your joints have less wear and tear when properly supported by a spine with good posture. This means you will  experience a healthier, less painful body.  Correct posture must be practiced with all of your activities, especially prolonged sitting and standing. Correct posture is as important when doing repetitive low-stress activities (typing) as it is when doing a single heavy-load activity (lifting). RESTING POSITIONS Consider which positions are most painful for you when choosing a resting position. If you have pain with flexion-based activities (sitting, bending, stooping, squatting), choose a position that allows you to rest in a less flexed posture. You would want to avoid curling into a fetal position on your side. If your pain worsens with extension-based activities (prolonged standing, working overhead), avoid resting in an extended position such as sleeping on your stomach. Most people will find more comfort when they rest with their spine in a more neutral position, neither too rounded nor too   arched. Lying on a non-sagging bed on your side with a pillow between your knees, or on your back with a pillow under your knees will often provide some relief. Keep in mind, being in any one position for a prolonged period of time, no matter how correct your posture, can still lead to stiffness. PROPER SITTING POSTURE In order to minimize stress and discomfort on your spine, you must sit with correct posture Sitting with good posture should be effortless for a healthy body. Returning to good posture is a gradual process. Many people can work toward this most comfortably by using various supports until they have the flexibility and strength to maintain this posture on their own. When sitting with proper posture, your ears will fall over your shoulders and your shoulders will fall over your hips. You should use the back of the chair to support your upper back. Your low back will be in a neutral position, just slightly arched. You may place a small pillow or folded towel at the base of your low back for support.  When  working at a desk, create an environment that supports good, upright posture. Without extra support, muscles fatigue and lead to excessive strain on joints and other tissues. Keep these recommendations in mind: CHAIR:   A chair should be able to slide under your desk when your back makes contact with the back of the chair. This allows you to work closely.  The chair's height should allow your eyes to be level with the upper part of your monitor and your hands to be slightly lower than your elbows. BODY POSITION  Your feet should make contact with the floor. If this is not possible, use a foot rest.  Keep your ears over your shoulders. This will reduce stress on your neck and low back. INCORRECT SITTING POSTURES   If you are feeling tired and unable to assume a healthy sitting posture, do not slouch or slump. This puts excessive strain on your back tissues, causing more damage and pain. Healthier options include:  Using more support, like a lumbar pillow.  Switching tasks to something that requires you to be upright or walking.  Talking a brief walk.  Lying down to rest in a neutral-spine position. PROLONGED STANDING WHILE SLIGHTLY LEANING FORWARD  When completing a task that requires you to lean forward while standing in one place for a long time, place either foot up on a stationary 2-4 inch high object to help maintain the best posture. When both feet are on the ground, the low back tends to lose its slight inward curve. If this curve flattens (or becomes too large), then the back and your other joints will experience too much stress, fatigue more quickly and can cause pain.  CORRECT STANDING POSTURES Proper standing posture should be assumed with all daily activities, even if they only take a few moments, like when brushing your teeth. As in sitting, your ears should fall over your shoulders and your shoulders should fall over your hips. You should keep a slight tension in your abdominal  muscles to brace your spine. Your tailbone should point down to the ground, not behind your body, resulting in an over-extended swayback posture.  INCORRECT STANDING POSTURES  Common incorrect standing postures include a forward head, locked knees and/or an excessive swayback. WALKING Walk with an upright posture. Your ears, shoulders and hips should all line-up. PROLONGED ACTIVITY IN A FLEXED POSITION When completing a task that requires you to bend forward   at your waist or lean over a low surface, try to find a way to stabilize 3 of 4 of your limbs. You can place a hand or elbow on your thigh or rest a knee on the surface you are reaching across. This will provide you more stability so that your muscles do not fatigue as quickly. By keeping your knees relaxed, or slightly bent, you will also reduce stress across your low back. CORRECT LIFTING TECHNIQUES DO :   Assume a wide stance. This will provide you more stability and the opportunity to get as close as possible to the object which you are lifting.  Tense your abdominals to brace your spine; then bend at the knees and hips. Keeping your back locked in a neutral-spine position, lift using your leg muscles. Lift with your legs, keeping your back straight.  Test the weight of unknown objects before attempting to lift them.  Try to keep your elbows locked down at your sides in order get the best strength from your shoulders when carrying an object.  Always ask for help when lifting heavy or awkward objects. INCORRECT LIFTING TECHNIQUES DO NOT:   Lock your knees when lifting, even if it is a small object.  Bend and twist. Pivot at your feet or move your feet when needing to change directions.  Assume that you cannot safely pick up a paperclip without proper posture. Document Released: 09/27/2005 Document Revised: 02/11/2014 Document Reviewed: 01/09/2009 [employer] Patient Information 2015 [employer]. This information is not intended to  replace advice given to you by your health care provider. Make sure you discuss any questions you have with your health care provider.  

## 2015-10-21 DIAGNOSIS — I739 Peripheral vascular disease, unspecified: Secondary | ICD-10-CM | POA: Diagnosis not present

## 2015-10-29 DIAGNOSIS — M25571 Pain in right ankle and joints of right foot: Secondary | ICD-10-CM | POA: Diagnosis not present

## 2015-10-30 DIAGNOSIS — Z6830 Body mass index (BMI) 30.0-30.9, adult: Secondary | ICD-10-CM | POA: Diagnosis not present

## 2015-10-30 DIAGNOSIS — I10 Essential (primary) hypertension: Secondary | ICD-10-CM | POA: Diagnosis not present

## 2015-10-30 DIAGNOSIS — R600 Localized edema: Secondary | ICD-10-CM | POA: Diagnosis not present

## 2015-10-30 DIAGNOSIS — M79671 Pain in right foot: Secondary | ICD-10-CM | POA: Diagnosis not present

## 2015-10-30 DIAGNOSIS — L0889 Other specified local infections of the skin and subcutaneous tissue: Secondary | ICD-10-CM | POA: Diagnosis not present

## 2015-11-11 DIAGNOSIS — R309 Painful micturition, unspecified: Secondary | ICD-10-CM | POA: Diagnosis not present

## 2015-11-11 DIAGNOSIS — I872 Venous insufficiency (chronic) (peripheral): Secondary | ICD-10-CM | POA: Diagnosis not present

## 2015-11-11 DIAGNOSIS — R600 Localized edema: Secondary | ICD-10-CM | POA: Diagnosis not present

## 2015-11-11 DIAGNOSIS — N390 Urinary tract infection, site not specified: Secondary | ICD-10-CM | POA: Diagnosis not present

## 2015-11-11 DIAGNOSIS — M79671 Pain in right foot: Secondary | ICD-10-CM | POA: Diagnosis not present

## 2015-11-11 DIAGNOSIS — I10 Essential (primary) hypertension: Secondary | ICD-10-CM | POA: Diagnosis not present

## 2015-11-11 DIAGNOSIS — L0889 Other specified local infections of the skin and subcutaneous tissue: Secondary | ICD-10-CM | POA: Diagnosis not present

## 2015-11-11 DIAGNOSIS — Z6830 Body mass index (BMI) 30.0-30.9, adult: Secondary | ICD-10-CM | POA: Diagnosis not present

## 2015-11-11 DIAGNOSIS — G710 Muscular dystrophy: Secondary | ICD-10-CM | POA: Diagnosis not present

## 2015-11-12 ENCOUNTER — Other Ambulatory Visit: Payer: Self-pay | Admitting: Endocrinology

## 2015-11-12 DIAGNOSIS — M79671 Pain in right foot: Secondary | ICD-10-CM

## 2015-11-19 ENCOUNTER — Ambulatory Visit
Admission: RE | Admit: 2015-11-19 | Discharge: 2015-11-19 | Disposition: A | Discharge status: Home / Self Care | Payer: PPO | Source: Ambulatory Visit | Attending: Endocrinology | Admitting: Endocrinology

## 2015-11-19 DIAGNOSIS — M7989 Other specified soft tissue disorders: Secondary | ICD-10-CM | POA: Diagnosis not present

## 2015-11-19 DIAGNOSIS — M79671 Pain in right foot: Secondary | ICD-10-CM

## 2015-12-01 ENCOUNTER — Other Ambulatory Visit (HOSPITAL_COMMUNITY): Payer: Self-pay | Admitting: Endocrinology

## 2015-12-01 ENCOUNTER — Ambulatory Visit (HOSPITAL_COMMUNITY)
Admission: RE | Admit: 2015-12-01 | Discharge: 2015-12-01 | Disposition: A | Discharge status: Home / Self Care | Payer: PPO | Source: Ambulatory Visit | Attending: Vascular Surgery | Admitting: Vascular Surgery

## 2015-12-01 DIAGNOSIS — R600 Localized edema: Secondary | ICD-10-CM | POA: Diagnosis not present

## 2015-12-01 DIAGNOSIS — I83893 Varicose veins of bilateral lower extremities with other complications: Secondary | ICD-10-CM | POA: Insufficient documentation

## 2015-12-01 DIAGNOSIS — I10 Essential (primary) hypertension: Secondary | ICD-10-CM | POA: Diagnosis not present

## 2015-12-02 ENCOUNTER — Ambulatory Visit (INDEPENDENT_AMBULATORY_CARE_PROVIDER_SITE_OTHER): Payer: PPO | Admitting: Nurse Practitioner

## 2015-12-02 ENCOUNTER — Ambulatory Visit: Payer: BC Managed Care – PPO | Admitting: Nurse Practitioner

## 2015-12-02 ENCOUNTER — Encounter: Payer: Self-pay | Admitting: Nurse Practitioner

## 2015-12-02 VITALS — BP 137/80 | HR 96 | Ht 62.0 in | Wt 164.0 lb

## 2015-12-02 DIAGNOSIS — Z9181 History of falling: Secondary | ICD-10-CM

## 2015-12-02 DIAGNOSIS — G7109 Other specified muscular dystrophies: Secondary | ICD-10-CM

## 2015-12-02 DIAGNOSIS — R269 Unspecified abnormalities of gait and mobility: Secondary | ICD-10-CM

## 2015-12-02 DIAGNOSIS — G710 Muscular dystrophy: Secondary | ICD-10-CM | POA: Diagnosis not present

## 2015-12-02 DIAGNOSIS — G71039 Limb girdle muscular dystrophy, unspecified: Secondary | ICD-10-CM

## 2015-12-02 NOTE — Patient Instructions (Signed)
Given  Rx for new walker she is at high risk for falls Recommend physical therapy again once foot pain goes away Follow-up in 6 months

## 2015-12-02 NOTE — Progress Notes (Signed)
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 She was last seen by Dr. Brett Fairy 06/10/2015 She has a history of limb girdle muscular dystrophy as well as a secondary scoliosis. The scoliosis has led to some stiffness in her lower extremities problems to arise from a chair or from a seated position. The stiffness is worse if she remained seated for a longer period of time. She reports that just driving to town for 30 or 40 minutes would be enough to have problems. She is however still driving, her problem is to enter and leave the car. She is currently having some right foot pain that is being evaluated by both her primary care and orthopedics.  .In 2007 this established patient underwent a EMG and nerve conduction study, which showed only a mild peripheral neuropathy. Her neurologic symptoms are otherwise unchanged- she has the expected progression of gait difficulties, she does have a loss of balance /poor sense of balance and she is walking with a walker to stabilize both indoors and outdoors She has history of  sciatic nerve pain. Hip pain- treated with meloxicam and prednisone But only found relief after sterid injections.  She has had 3 falls in the 2014 , but none in 2015- and 2 in 2016 all occurred when she was not using her rolling walker.Her husband has died since last seen .She was divorced. She returns for reevaluation.   REVIEW OF SYSTEMS: Full 14 system review of systems performed and notable only for those listed, all others are neg:  Constitutional: Fatigue Cardiovascular: Leg swelling Ear/Nose/Throat: neg  Skin: neg Eyes: neg Respiratory: neg Gastroitestinal: neg    Hematology/Lymphatic: neg  Endocrine: neg Musculoskeletal: Joint pain back pain walking difficulty Allergy/Immunology: neg Neurological: neg Psychiatric: neg Sleep : neg   ALLERGIES: Allergies  Allergen Reactions  . Anesthetics, Amide   . Celebrex [Celecoxib]   . Ciprofloxacin Swelling  . Codeine Nausea And Vomiting  . Morphine And Related Nausea And Vomiting  . Sulfa Antibiotics     HOME MEDICATIONS: Outpatient Prescriptions Prior to Visit  Medication Sig Dispense Refill  . Artificial Tear Solution (SYSTANE CONTACTS) SOLN Apply to eye.    . bacitracin ophthalmic ointment Place [id number] application into both eyes 3 (three) times daily.    . Calcium Carbonate Antacid (TUMS PO) Take by mouth daily as needed. chewable    . Calcium Carbonate-Vit D-Min (CALTRATE PLUS PO) Take by mouth.      . denosumab (PROLIA) 60 MG/ML SOLN injection Inject 60 mg into the skin every 6 (six) months. Administer in upper arm, thigh, or abdomen    . diltiazem (CARDIZEM CD) 240 MG 24 hr capsule Take 240 mg by mouth daily.      . ergocalciferol (VITAMIN D2) 50000 UNITS capsule Take 50,000 Units by mouth once a week.      Marland Kitchen FLUTICASONE PROPIONATE, NASAL, NA Place into the nose. 2 sprays 1/daily    . lansoprazole (PREVACID) 30 MG capsule Take 30 mg by mouth daily.    Marland Kitchen losartan-hydrochlorothiazide (HYZAAR) 100-12.5 MG per tablet Take 12.5 tablets by mouth daily.    . Methylcellulose, Laxative, (CITRUCEL PO) Take by mouth. Calcium citrate+D, 2 times daily    . Multiple Vitamins-Minerals (OCUVITE PRESERVISION PO) Take by mouth.      Marland Kitchen  Polyethyl Glycol-Propyl Glycol 0.4-0.3 % SOLN Apply to eye.      . Probiotic Product (ALIGN PO) Take by mouth.      . Psyllium (METAMUCIL PO) Take by mouth daily.      . sodium chloride (AYR) 0.65 % nasal spray Place 1 spray into the nose as needed for congestion.    . Triamcinolone Acetonide (NASACORT AQ NA) Place into the nose. As needed    . nabumetone (RELAFEN) 500 MG tablet      . tizanidine (ZANAFLEX) 2 MG capsule Take 1 capsule (2 mg total) by mouth Nightly. (Patient not taking: Reported on 12/02/2015) 30 capsule 1   No facility-administered medications prior to visit.    PAST MEDICAL HISTORY: Past Medical History  Diagnosis Date  . IBS (irritable bowel syndrome)   . Macular hole of left eye   . Osteoporosis     pelvic fracture  . Atrophic vaginitis   . Basal cell carcinoma   . Jaw atrophy   . Pseudocholinesterase deficiency   . Muscular dystrophy ([place])   . GERD (gastroesophageal reflux disease)   . Arthritis   . Complication of anesthesia     pseudocholinesterase deficiency-hard to wake up  . Cervical dysplasia   . Hypertension   . Scoliosis   . Limb-girdle muscular dystrophy ([place]) 07/05/2013  . Falls     PAST SURGICAL HISTORY: Past Surgical History  Procedure Laterality Date  . Total abdominal hysterectomy w/ bilateral salpingoophorectomy  1981  . Breast lumpectomy  1971    benign  . Eye surgery  2003    left  . Cesarean section      x2  . Abdominal hysterectomy      TAH BSO  . Trigger finger release  07/28/2012    Procedure: RELEASE TRIGGER FINGER/A-1 PULLEY;  Surgeon: Cammie Sickle., MD;  Location: [facility];  Service: Orthopedics;  Laterality: Right;  EXCISION CYST RIGHT LONG A-1 RELEASE A-1 RIGHT LONG   . Colposcopy      FAMILY HISTORY: Family History  Problem Relation Age of Onset  . Hypertension Sister     SOCIAL HISTORY: Social History   Social History  . Marital Status: Divorced    Spouse Name: N/A  . Number of Children: 3  . Years of Education: 13   Occupational History  . retired     Theatre stage manager at [employer]  . Smoking status: Never Smoker   . Smokeless tobacco: Never Used  . Alcohol Use: No     Comment: rare  . Drug Use: No  . Sexual Activity: No   Other Topics Concern  . Not on file   Social History Narrative   Patient lives at home alone and  she is divorced. Patient is retired.   Right handed.   Caffeine- one cup daily.   Uses walker.   Grown children, live 7 miles away.       PHYSICAL EXAM  Filed Vitals:   12/02/15 0943  BP: 137/80  Pulse: 96  Height: 5\' 2"  (1.575 m)  Weight: 164 lb (74.39 kg)   Body mass index is 29.99 kg/(m^2). General: The patient is awake, alert and appears not in acute distress. The patient is well groomed.  Head: Normocephalic, atraumatic.  Neck is supple, no carotid bruit.  Cardiovascular: Regular rate and rhythm, without murmurs .  Respiratory: Lungs are clear to auscultation.  Skin: Without evidence of edema, or rash  Trunk: Severe  scolisis, spinal stenosis.  Neurologic exam :  The patient is awake and alert, oriented to place and time. Memory subjective described as intact.  There is a normal attention span & concentration ability.  Speech is fluent without dysarthria, dysphonia or aphasia. Mood and affect are appropriate.  Cranial nerves: Pupils are equal and briskly reactive to light. Extraocular movements in vertical and horizontal planes intact and without nystagmus. Visual fields by finger perimetry are intact.  Hearing to finger rub intact. Facial sensation intact to fine touch. Facial motor strength is symmetric and tongue and uvula move midline.  Motor exam: Weak hip flexion and adduction,  Sensory: Fine touch, pinprick and vibration were affected in the feet  Coordination: Rapid alternating movements in the fingers/hands is normal. Finger-to-nose maneuver tested and normal without evidence of ataxia, dysmetria or tremor.  Gait and station: Patient walks with a walker - penguin gait, no difficulty with turns Deep tendon reflexes: in the upper and lower extremities are Brisk , symmetric and intact. No clonus  DIAGNOSTIC DATA (LABS, IMAGING, TESTING) -  ASSESSMENT AND PLAN  82 y.o. year old female  has a past medical history of  Muscular dystrophy ([place]);   Arthritis;  Hypertension; Scoliosis; Limb-girdle muscular dystrophy ([place]) (07/05/2013); and Falls. here to follow-up.  Given  Rx for new walker she is at high risk for falls Recommend physical therapy again once foot pain goes away Follow-up in 6 months Dennie Bible, [facility], [facility], [facility] [address], [place], [place] [address] [phone]

## 2015-12-02 NOTE — Progress Notes (Signed)
I agree with the assessment and plan as directed by NP .The patient is known to me .   Caya Soberanis, MD  

## 2015-12-09 ENCOUNTER — Ambulatory Visit (HOSPITAL_COMMUNITY)
Admission: RE | Admit: 2015-12-09 | Discharge: 2015-12-09 | Disposition: A | Discharge status: Home / Self Care | Payer: PPO | Source: Ambulatory Visit | Attending: Endocrinology | Admitting: Endocrinology

## 2015-12-09 ENCOUNTER — Encounter (HOSPITAL_COMMUNITY): Payer: Self-pay

## 2015-12-09 DIAGNOSIS — M810 Age-related osteoporosis without current pathological fracture: Secondary | ICD-10-CM | POA: Insufficient documentation

## 2015-12-09 MED ORDER — DENOSUMAB 60 MG/ML ~~LOC~~ SOLN
60.0000 mg | Freq: Once | SUBCUTANEOUS | Status: AC
  Administered 2015-12-09: 60 mg via SUBCUTANEOUS
  Filled 2015-12-09: qty 1

## 2015-12-09 NOTE — Discharge Instructions (Signed)
Denosumab injection  What is this medicine?  DENOSUMAB (den oh sue mab) slows bone breakdown. Prolia is used to treat osteoporosis in women after menopause and in men. Xgeva is used to prevent bone fractures and other bone problems caused by cancer bone metastases. Xgeva is also used to treat giant cell tumor of the bone.  This medicine may be used for other purposes; ask your health care provider or pharmacist if you have questions.  What should I tell my health care provider before I take this medicine?  They need to know if you have any of these conditions:  -dental disease  -eczema  -infection or history of infections  -kidney disease or on dialysis  -low blood calcium or vitamin D  -malabsorption syndrome  -scheduled to have surgery or tooth extraction  -taking medicine that contains denosumab  -thyroid or parathyroid disease  -an unusual reaction to denosumab, other medicines, foods, dyes, or preservatives  -pregnant or trying to get pregnant  -breast-feeding  How should I use this medicine?  This medicine is for injection under the skin. It is given by a health care professional in a hospital or clinic setting.  If you are getting Prolia, a special MedGuide will be given to you by the pharmacist with each prescription and refill. Be sure to read this information carefully each time.  For Prolia, talk to your pediatrician regarding the use of this medicine in children. Special care may be needed. For Xgeva, talk to your pediatrician regarding the use of this medicine in children. While this drug may be prescribed for children as young as 13 years for selected conditions, precautions do apply.  Overdosage: If you think you have taken too much of this medicine contact a poison control center or emergency room at once.  NOTE: This medicine is only for you. Do not share this medicine with others.  What if I miss a dose?  It is important not to miss your dose. Call your doctor or health care professional if you are  unable to keep an appointment.  What may interact with this medicine?  Do not take this medicine with any of the following medications:  -other medicines containing denosumab  This medicine may also interact with the following medications:  -medicines that suppress the immune system  -medicines that treat cancer  -steroid medicines like prednisone or cortisone  This list may not describe all possible interactions. Give your health care provider a list of all the medicines, herbs, non-prescription drugs, or dietary supplements you use. Also tell them if you smoke, drink alcohol, or use illegal drugs. Some items may interact with your medicine.  What should I watch for while using this medicine?  Visit your doctor or health care professional for regular checks on your progress. Your doctor or health care professional may order blood tests and other tests to see how you are doing.  Call your doctor or health care professional if you get a cold or other infection while receiving this medicine. Do not treat yourself. This medicine may decrease your body's ability to fight infection.  You should make sure you get enough calcium and vitamin D while you are taking this medicine, unless your doctor tells you not to. Discuss the foods you eat and the vitamins you take with your health care professional.  See your dentist regularly. Brush and floss your teeth as directed. Before you have any dental work done, tell your dentist you are receiving this medicine.  Do   not become pregnant while taking this medicine or for 5 months after stopping it. Women should inform their doctor if they wish to become pregnant or think they might be pregnant. There is a potential for serious side effects to an unborn child. Talk to your health care professional or pharmacist for more information.  What side effects may I notice from receiving this medicine?  Side effects that you should report to your doctor or health care professional as soon as  possible:  -allergic reactions like skin rash, itching or hives, swelling of the face, lips, or tongue  -breathing problems  -chest pain  -fast, irregular heartbeat  -feeling faint or lightheaded, falls  -fever, chills, or any other sign of infection  -muscle spasms, tightening, or twitches  -numbness or tingling  -skin blisters or bumps, or is dry, peels, or red  -slow healing or unexplained pain in the mouth or jaw  -unusual bleeding or bruising  Side effects that usually do not require medical attention (Report these to your doctor or health care professional if they continue or are bothersome.):  -muscle pain  -stomach upset, gas  This list may not describe all possible side effects. Call your doctor for medical advice about side effects. You may report side effects to FDA at 1-800-FDA-1088.  Where should I keep my medicine?  This medicine is only given in a clinic, doctor's office, or other health care setting and will not be stored at home.  NOTE: This sheet is a summary. It may not cover all possible information. If you have questions about this medicine, talk to your doctor, pharmacist, or health care provider.      2016, Elsevier/Gold Standard. (2012-03-27 12:37:47)

## 2015-12-11 ENCOUNTER — Telehealth: Payer: Self-pay | Admitting: Nurse Practitioner

## 2015-12-11 DIAGNOSIS — R269 Unspecified abnormalities of gait and mobility: Secondary | ICD-10-CM

## 2015-12-11 DIAGNOSIS — G7109 Other specified muscular dystrophies: Secondary | ICD-10-CM

## 2015-12-11 DIAGNOSIS — G71039 Limb girdle muscular dystrophy, unspecified: Secondary | ICD-10-CM

## 2015-12-11 NOTE — Telephone Encounter (Signed)
Robin with [facility] is calling to get a referral for physical therapy for patient's back and hip. The patient has an appointment scheduled on 12-15-15. Please fax the referral to [phone].

## 2015-12-15 DIAGNOSIS — M4120 Other idiopathic scoliosis, site unspecified: Secondary | ICD-10-CM | POA: Diagnosis not present

## 2015-12-15 DIAGNOSIS — M5431 Sciatica, right side: Secondary | ICD-10-CM | POA: Diagnosis not present

## 2015-12-15 DIAGNOSIS — G710 Muscular dystrophy: Secondary | ICD-10-CM | POA: Diagnosis not present

## 2015-12-15 DIAGNOSIS — M545 Low back pain: Secondary | ICD-10-CM | POA: Diagnosis not present

## 2015-12-17 DIAGNOSIS — M545 Low back pain: Secondary | ICD-10-CM | POA: Diagnosis not present

## 2015-12-17 DIAGNOSIS — M4120 Other idiopathic scoliosis, site unspecified: Secondary | ICD-10-CM | POA: Diagnosis not present

## 2015-12-17 DIAGNOSIS — G710 Muscular dystrophy: Secondary | ICD-10-CM | POA: Diagnosis not present

## 2015-12-17 DIAGNOSIS — M5431 Sciatica, right side: Secondary | ICD-10-CM | POA: Diagnosis not present

## 2015-12-23 DIAGNOSIS — M5431 Sciatica, right side: Secondary | ICD-10-CM | POA: Diagnosis not present

## 2015-12-23 DIAGNOSIS — M545 Low back pain: Secondary | ICD-10-CM | POA: Diagnosis not present

## 2015-12-23 DIAGNOSIS — G710 Muscular dystrophy: Secondary | ICD-10-CM | POA: Diagnosis not present

## 2015-12-23 DIAGNOSIS — M4120 Other idiopathic scoliosis, site unspecified: Secondary | ICD-10-CM | POA: Diagnosis not present

## 2015-12-25 DIAGNOSIS — M545 Low back pain: Secondary | ICD-10-CM | POA: Diagnosis not present

## 2015-12-25 DIAGNOSIS — M4120 Other idiopathic scoliosis, site unspecified: Secondary | ICD-10-CM | POA: Diagnosis not present

## 2015-12-25 DIAGNOSIS — G710 Muscular dystrophy: Secondary | ICD-10-CM | POA: Diagnosis not present

## 2015-12-25 DIAGNOSIS — M5431 Sciatica, right side: Secondary | ICD-10-CM | POA: Diagnosis not present

## 2015-12-26 DIAGNOSIS — Z0100 Encounter for examination of eyes and vision without abnormal findings: Secondary | ICD-10-CM | POA: Diagnosis not present

## 2015-12-26 DIAGNOSIS — H353112 Nonexudative age-related macular degeneration, right eye, intermediate dry stage: Secondary | ICD-10-CM | POA: Diagnosis not present

## 2015-12-26 DIAGNOSIS — H353122 Nonexudative age-related macular degeneration, left eye, intermediate dry stage: Secondary | ICD-10-CM | POA: Diagnosis not present

## 2015-12-26 DIAGNOSIS — H04123 Dry eye syndrome of bilateral lacrimal glands: Secondary | ICD-10-CM | POA: Diagnosis not present

## 2015-12-29 DIAGNOSIS — M545 Low back pain: Secondary | ICD-10-CM | POA: Diagnosis not present

## 2015-12-29 DIAGNOSIS — I872 Venous insufficiency (chronic) (peripheral): Secondary | ICD-10-CM | POA: Diagnosis not present

## 2015-12-29 DIAGNOSIS — E784 Other hyperlipidemia: Secondary | ICD-10-CM | POA: Diagnosis not present

## 2015-12-29 DIAGNOSIS — H35349 Macular cyst, hole, or pseudohole, unspecified eye: Secondary | ICD-10-CM | POA: Diagnosis not present

## 2015-12-29 DIAGNOSIS — M5431 Sciatica, right side: Secondary | ICD-10-CM | POA: Diagnosis not present

## 2015-12-29 DIAGNOSIS — R7301 Impaired fasting glucose: Secondary | ICD-10-CM | POA: Diagnosis not present

## 2015-12-29 DIAGNOSIS — M4120 Other idiopathic scoliosis, site unspecified: Secondary | ICD-10-CM | POA: Diagnosis not present

## 2015-12-29 DIAGNOSIS — R609 Edema, unspecified: Secondary | ICD-10-CM | POA: Diagnosis not present

## 2015-12-29 DIAGNOSIS — I10 Essential (primary) hypertension: Secondary | ICD-10-CM | POA: Diagnosis not present

## 2015-12-29 DIAGNOSIS — Z6830 Body mass index (BMI) 30.0-30.9, adult: Secondary | ICD-10-CM | POA: Diagnosis not present

## 2015-12-29 DIAGNOSIS — R600 Localized edema: Secondary | ICD-10-CM | POA: Diagnosis not present

## 2015-12-29 DIAGNOSIS — K5790 Diverticulosis of intestine, part unspecified, without perforation or abscess without bleeding: Secondary | ICD-10-CM | POA: Diagnosis not present

## 2015-12-29 DIAGNOSIS — E559 Vitamin D deficiency, unspecified: Secondary | ICD-10-CM | POA: Diagnosis not present

## 2015-12-29 DIAGNOSIS — M810 Age-related osteoporosis without current pathological fracture: Secondary | ICD-10-CM | POA: Diagnosis not present

## 2015-12-29 DIAGNOSIS — G710 Muscular dystrophy: Secondary | ICD-10-CM | POA: Diagnosis not present

## 2015-12-31 DIAGNOSIS — M5431 Sciatica, right side: Secondary | ICD-10-CM | POA: Diagnosis not present

## 2015-12-31 DIAGNOSIS — G710 Muscular dystrophy: Secondary | ICD-10-CM | POA: Diagnosis not present

## 2015-12-31 DIAGNOSIS — M545 Low back pain: Secondary | ICD-10-CM | POA: Diagnosis not present

## 2015-12-31 DIAGNOSIS — M4120 Other idiopathic scoliosis, site unspecified: Secondary | ICD-10-CM | POA: Diagnosis not present

## 2016-01-05 DIAGNOSIS — M4120 Other idiopathic scoliosis, site unspecified: Secondary | ICD-10-CM | POA: Diagnosis not present

## 2016-01-05 DIAGNOSIS — G710 Muscular dystrophy: Secondary | ICD-10-CM | POA: Diagnosis not present

## 2016-01-05 DIAGNOSIS — M5431 Sciatica, right side: Secondary | ICD-10-CM | POA: Diagnosis not present

## 2016-01-05 DIAGNOSIS — M545 Low back pain: Secondary | ICD-10-CM | POA: Diagnosis not present

## 2016-01-07 DIAGNOSIS — M4120 Other idiopathic scoliosis, site unspecified: Secondary | ICD-10-CM | POA: Diagnosis not present

## 2016-01-07 DIAGNOSIS — M5431 Sciatica, right side: Secondary | ICD-10-CM | POA: Diagnosis not present

## 2016-01-07 DIAGNOSIS — G710 Muscular dystrophy: Secondary | ICD-10-CM | POA: Diagnosis not present

## 2016-01-07 DIAGNOSIS — M545 Low back pain: Secondary | ICD-10-CM | POA: Diagnosis not present

## 2016-01-27 DIAGNOSIS — R600 Localized edema: Secondary | ICD-10-CM | POA: Diagnosis not present

## 2016-01-27 DIAGNOSIS — I872 Venous insufficiency (chronic) (peripheral): Secondary | ICD-10-CM | POA: Diagnosis not present

## 2016-01-27 DIAGNOSIS — Z6830 Body mass index (BMI) 30.0-30.9, adult: Secondary | ICD-10-CM | POA: Diagnosis not present

## 2016-01-29 ENCOUNTER — Other Ambulatory Visit: Payer: Self-pay | Admitting: Endocrinology

## 2016-01-29 DIAGNOSIS — I872 Venous insufficiency (chronic) (peripheral): Secondary | ICD-10-CM

## 2016-01-29 DIAGNOSIS — R600 Localized edema: Secondary | ICD-10-CM

## 2016-01-30 ENCOUNTER — Other Ambulatory Visit (HOSPITAL_COMMUNITY): Payer: Self-pay | Admitting: Endocrinology

## 2016-01-30 DIAGNOSIS — I872 Venous insufficiency (chronic) (peripheral): Secondary | ICD-10-CM

## 2016-01-30 DIAGNOSIS — M25512 Pain in left shoulder: Secondary | ICD-10-CM | POA: Diagnosis not present

## 2016-01-30 DIAGNOSIS — R600 Localized edema: Secondary | ICD-10-CM

## 2016-01-30 DIAGNOSIS — M9902 Segmental and somatic dysfunction of thoracic region: Secondary | ICD-10-CM | POA: Diagnosis not present

## 2016-01-30 DIAGNOSIS — M9901 Segmental and somatic dysfunction of cervical region: Secondary | ICD-10-CM | POA: Diagnosis not present

## 2016-01-30 DIAGNOSIS — M50322 Other cervical disc degeneration at C5-C6 level: Secondary | ICD-10-CM | POA: Diagnosis not present

## 2016-02-03 DIAGNOSIS — M9902 Segmental and somatic dysfunction of thoracic region: Secondary | ICD-10-CM | POA: Diagnosis not present

## 2016-02-03 DIAGNOSIS — M50322 Other cervical disc degeneration at C5-C6 level: Secondary | ICD-10-CM | POA: Diagnosis not present

## 2016-02-03 DIAGNOSIS — M9901 Segmental and somatic dysfunction of cervical region: Secondary | ICD-10-CM | POA: Diagnosis not present

## 2016-02-03 DIAGNOSIS — M25512 Pain in left shoulder: Secondary | ICD-10-CM | POA: Diagnosis not present

## 2016-02-04 ENCOUNTER — Inpatient Hospital Stay: Admission: RE | Admit: 2016-02-04 | Payer: PPO | Source: Ambulatory Visit

## 2016-02-05 DIAGNOSIS — M9901 Segmental and somatic dysfunction of cervical region: Secondary | ICD-10-CM | POA: Diagnosis not present

## 2016-02-05 DIAGNOSIS — M50322 Other cervical disc degeneration at C5-C6 level: Secondary | ICD-10-CM | POA: Diagnosis not present

## 2016-02-05 DIAGNOSIS — M9902 Segmental and somatic dysfunction of thoracic region: Secondary | ICD-10-CM | POA: Diagnosis not present

## 2016-02-05 DIAGNOSIS — M25512 Pain in left shoulder: Secondary | ICD-10-CM | POA: Diagnosis not present

## 2016-02-06 ENCOUNTER — Other Ambulatory Visit: Payer: PPO

## 2016-02-09 DIAGNOSIS — I728 Aneurysm of other specified arteries: Secondary | ICD-10-CM | POA: Diagnosis not present

## 2016-02-09 DIAGNOSIS — I872 Venous insufficiency (chronic) (peripheral): Secondary | ICD-10-CM | POA: Diagnosis not present

## 2016-02-09 DIAGNOSIS — K5730 Diverticulosis of large intestine without perforation or abscess without bleeding: Secondary | ICD-10-CM | POA: Diagnosis not present

## 2016-02-09 DIAGNOSIS — I748 Embolism and thrombosis of other arteries: Secondary | ICD-10-CM | POA: Diagnosis not present

## 2016-02-09 DIAGNOSIS — I708 Atherosclerosis of other arteries: Secondary | ICD-10-CM | POA: Diagnosis not present

## 2016-02-09 DIAGNOSIS — K829 Disease of gallbladder, unspecified: Secondary | ICD-10-CM | POA: Diagnosis not present

## 2016-02-09 DIAGNOSIS — N200 Calculus of kidney: Secondary | ICD-10-CM | POA: Diagnosis not present

## 2016-02-09 DIAGNOSIS — K760 Fatty (change of) liver, not elsewhere classified: Secondary | ICD-10-CM | POA: Diagnosis not present

## 2016-02-10 ENCOUNTER — Ambulatory Visit (HOSPITAL_COMMUNITY): Payer: PPO | Attending: Internal Medicine

## 2016-02-10 ENCOUNTER — Other Ambulatory Visit: Payer: Self-pay

## 2016-02-10 DIAGNOSIS — R600 Localized edema: Secondary | ICD-10-CM | POA: Diagnosis not present

## 2016-02-10 DIAGNOSIS — I872 Venous insufficiency (chronic) (peripheral): Secondary | ICD-10-CM | POA: Diagnosis not present

## 2016-02-10 DIAGNOSIS — I340 Nonrheumatic mitral (valve) insufficiency: Secondary | ICD-10-CM | POA: Diagnosis not present

## 2016-02-10 DIAGNOSIS — I119 Hypertensive heart disease without heart failure: Secondary | ICD-10-CM | POA: Insufficient documentation

## 2016-02-12 ENCOUNTER — Other Ambulatory Visit: Payer: PPO

## 2016-02-18 ENCOUNTER — Encounter: Payer: Self-pay | Admitting: Vascular Surgery

## 2016-02-20 ENCOUNTER — Ambulatory Visit (INDEPENDENT_AMBULATORY_CARE_PROVIDER_SITE_OTHER): Payer: PPO | Admitting: Vascular Surgery

## 2016-02-20 ENCOUNTER — Encounter: Payer: Self-pay | Admitting: Vascular Surgery

## 2016-02-20 VITALS — BP 141/84 | HR 83 | Temp 97.4°F | Resp 14 | Ht 62.0 in | Wt 160.0 lb

## 2016-02-20 DIAGNOSIS — I872 Venous insufficiency (chronic) (peripheral): Secondary | ICD-10-CM | POA: Diagnosis not present

## 2016-02-20 NOTE — Progress Notes (Signed)
Filed Vitals:   02/20/16 0910 02/20/16 0913  BP: 162/85 141/84  Pulse: 83 83  Temp: 97.4 F (36.3 C)   Resp: 14   Height: 5\' 2"  (1.575 m)   Weight: 160 lb (72.576 kg)   SpO2: 96%

## 2016-02-20 NOTE — Progress Notes (Signed)
[facility]  Patient name: Catherine Harding MRN: [id number] DOB: [birth year] Sex: female  REASON FOR CONSULT: bilateral chronic venous insufficiency. Referred by Dr. Reynold Bowen  HPI: Catherine Harding is a 82 y.o. female, who has had a long history of bilateral lower extremity swelling. However in December her symptoms worsen. Swelling is more significant on the left side. She denies any history of DVT or phlebitis. She does experience some aching heaviness in both legs which is aggravated by standing and relieved with elevation. There is no family history of clotting disorders that she is aware of.  I have reviewed the records from Dr. Clarisa Fling office. The patient has a history of bilateral venous insufficiency.   Past Medical History  Diagnosis Date  . IBS (irritable bowel syndrome)   . Macular hole of left eye   . Osteoporosis     pelvic fracture  . Atrophic vaginitis   . Basal cell carcinoma   . Jaw atrophy   . Pseudocholinesterase deficiency   . Muscular dystrophy ([place])   . GERD (gastroesophageal reflux disease)   . Arthritis   . Complication of anesthesia     pseudocholinesterase deficiency-hard to wake up  . Cervical dysplasia   . Hypertension   . Scoliosis   . Limb-girdle muscular dystrophy ([place]) 07/05/2013  . Falls     Family History  Problem Relation Age of Onset  . Hypertension Sister     SOCIAL HISTORY: Social History   Social History  . Marital Status: Divorced    Spouse Name: N/A  . Number of Children: 3  . Years of Education: 13   Occupational History  . retired     Theatre stage manager at [employer]  . Smoking status: Never Smoker   . Smokeless tobacco: Never Used  . Alcohol Use: No     Comment: rare  . Drug Use: No  . Sexual Activity: No   Other Topics Concern  . Not on file   Social History Narrative   Patient lives at home alone and she is divorced. Patient is retired.    Right handed.   Caffeine- one cup daily.   Uses walker.   Grown children, live 7 miles away.      Allergies  Allergen Reactions  . Anesthetics, Amide   . Celebrex [Celecoxib]   . Ciprofloxacin Swelling  . Codeine Nausea And Vomiting  . Morphine And Related Nausea And Vomiting  . Sulfa Antibiotics     Current Outpatient Prescriptions  Medication Sig Dispense Refill  . Artificial Tear Solution (SYSTANE CONTACTS) SOLN Apply to eye.    . bacitracin ophthalmic ointment Place [id number] application into both eyes 3 (three) times daily.    . Calcium Carbonate Antacid (TUMS PO) Take by mouth daily as needed. chewable    . Calcium Carbonate-Vit D-Min (CALTRATE PLUS PO) Take by mouth.      . denosumab (PROLIA) 60 MG/ML SOLN injection Inject 60 mg into the skin every 6 (six) months. Administer in upper arm, thigh, or abdomen    . diltiazem (CARDIZEM CD) 240 MG 24 hr capsule Take 240 mg by mouth daily.      . ergocalciferol (VITAMIN D2) 50000 UNITS capsule Take 50,000 Units by mouth once a week.      Marland Kitchen FLUTICASONE PROPIONATE, NASAL, NA Place into the nose. 2 sprays 1/daily    . lansoprazole (PREVACID) 30 MG capsule Take 30 mg by mouth daily.    Marland Kitchen  losartan-hydrochlorothiazide (HYZAAR) 100-12.5 MG per tablet Take 12.5 tablets by mouth daily.    . Methylcellulose, Laxative, (CITRUCEL PO) Take by mouth. Calcium citrate+D, 2 times daily    . Multiple Vitamins-Minerals (OCUVITE PRESERVISION PO) Take by mouth.      Vladimir Faster Glycol-Propyl Glycol 0.4-0.3 % SOLN Apply to eye.      . Prenatal Vit-Fe Fumarate-FA (MULTIVITAMIN-PRENATAL) 27-0.8 MG TABS tablet Take 1 tablet by mouth daily at 12 noon.    . Probiotic Product (ALIGN PO) Take by mouth.      . Psyllium (METAMUCIL PO) Take by mouth daily.      . sodium chloride (AYR) 0.65 % nasal spray Place 1 spray into the nose as needed for congestion.    . Triamcinolone Acetonide (NASACORT AQ NA) Place into the nose. As needed    . amoxicillin (AMOXIL) 500 MG  capsule Take 500 mg by mouth 3 (three) times daily. Reported on 02/20/2016    . furosemide (LASIX) 20 MG tablet Take 20 mg by mouth 2 (two) times daily.    . hyoscyamine (LEVSIN SL) 0.125 MG SL tablet Place 0.125 mg under the tongue as needed.     No current facility-administered medications for this visit.    REVIEW OF SYSTEMS:  [X]  denotes positive finding, [ ]  denotes negative finding Cardiac  Comments:  Chest pain or chest pressure:    Shortness of breath upon exertion:    Short of breath when lying flat:    Irregular heart rhythm:        Vascular    Pain in calf, thigh, or hip brought on by ambulation:    Pain in feet at night that wakes you up from your sleep:     Blood clot in your veins:    Leg swelling:  X       Pulmonary    Oxygen at home:    Productive cough:     Wheezing:         Neurologic    Sudden weakness in arms or legs:     Sudden numbness in arms or legs:     Sudden onset of difficulty speaking or slurred speech:    Temporary loss of vision in one eye:     Problems with dizziness:         Gastrointestinal    Blood in stool:     Vomited blood:         Genitourinary    Burning when urinating:     Blood in urine:        Psychiatric    Major depression:         Hematologic    Bleeding problems:    Problems with blood clotting too easily:        Skin    Rashes or ulcers:        Constitutional    Fever or chills:      PHYSICAL EXAM: Filed Vitals:   02/20/16 0910 02/20/16 0913  BP: 162/85 141/84  Pulse: 83 83  Temp: 97.4 F (36.3 C)   Resp: 14   Height: 5\' 2"  (1.575 m)   Weight: 160 lb (72.576 kg)   SpO2: 96%     GENERAL: The patient is a well-nourished female, in no acute distress. The vital signs are documented above. CARDIAC: There is a regular rate and rhythm.  VASCULAR: I do not detect carotid bruits. She has palpable femoral and popliteal pulses bilaterally. I cannot palpate pedal pulses. She does  have biphasic Doppler signals  in both feet. She has bilateral lower extremity swelling more significant on the left side. PULMONARY: There is good air exchange bilaterally without wheezing or rales. ABDOMEN: Soft and non-tender with normal pitched bowel sounds.  MUSCULOSKELETAL: There are no major deformities or cyanosis. NEUROLOGIC: No focal weakness or paresthesias are detected. SKIN: She has hyperpigmentation bilaterally consistent with chronic venous insufficiency. PSYCHIATRIC: The patient has a normal affect.  DATA:   LOWER EXTREMITY VENOUS DUPLEX: I have independently interpreted her lower extremity venous duplex scan that was done on 12/01/2015.  On the right side there is no evidence of DVT or superficial thrombophlebitis. There is deep vein reflux involving the right common femoral vein. There is reflux at the right saphenofemoral junction. There is no significant reflux in the great saphenous vein or small saphenous vein on the right.  On the left side, there is no evidence of DVT or superficial thrombophlebitis. There is deep vein reflux involving the left common femoral vein. There is also reflux at the saphenofemoral junction on the left and also in the left great saphenous vein. There is no reflux in the left small saphenous vein.  ECHO: the patient did have an echo on 5-17 which showed moderate concentric hypertrophy. Systolic function was vigorous. Ejection fraction was 65-70%.  CTA ABDOMEN AND PELVIS: I did review the CTA of the abdomen and pelvis which was done on 02/09/2016. There was no significant aortoiliac occlusive disease. The IVC and iliac veins and common femoral veins were all patent.   MEDICAL ISSUES:  CHRONIC VENOUS INSUFFICIENCY: Based on her exam and duplex study she has evidence of bilateral chronic venous insufficiency. This is more significant on the left side where she has reflux in the left great saphenous vein. . I've explained that this is a chronic problem. We have discussed the  importance of intermittent leg elevation in the proper positioning for this. In addition, I have discussed the importance of wearing compression stockings when they will be standing or sitting for a long time. I have written her a prescription for knee-high compression stockings with a gradient of 15-20 mmHg.  I have encouraged him to stay as active as possible and to avoid prolonged sitting and standing. We have also discussed water aerobics which I think is also very helpful for people with chronic venous insufficiency. Currently, she would not be a candidate for laser ablation of the left great saphenous vein. If her symptoms progress in the future then we could try her in thigh-high stockings and if this were not successful that might potentially be an option.  HYPERTENSION: The patient's initial blood pressure today was elevated. We repeated this and this was still elevated. We have encouraged the patient to follow up with their primary care physician for management of their blood pressure.   Deitra Mayo [facility]: [phone]

## 2016-02-24 DIAGNOSIS — M25512 Pain in left shoulder: Secondary | ICD-10-CM | POA: Diagnosis not present

## 2016-02-24 DIAGNOSIS — M9902 Segmental and somatic dysfunction of thoracic region: Secondary | ICD-10-CM | POA: Diagnosis not present

## 2016-02-24 DIAGNOSIS — M9901 Segmental and somatic dysfunction of cervical region: Secondary | ICD-10-CM | POA: Diagnosis not present

## 2016-02-24 DIAGNOSIS — M50322 Other cervical disc degeneration at C5-C6 level: Secondary | ICD-10-CM | POA: Diagnosis not present

## 2016-02-26 DIAGNOSIS — M25512 Pain in left shoulder: Secondary | ICD-10-CM | POA: Diagnosis not present

## 2016-02-26 DIAGNOSIS — M50322 Other cervical disc degeneration at C5-C6 level: Secondary | ICD-10-CM | POA: Diagnosis not present

## 2016-02-26 DIAGNOSIS — M9902 Segmental and somatic dysfunction of thoracic region: Secondary | ICD-10-CM | POA: Diagnosis not present

## 2016-02-26 DIAGNOSIS — M9901 Segmental and somatic dysfunction of cervical region: Secondary | ICD-10-CM | POA: Diagnosis not present

## 2016-03-30 DIAGNOSIS — M810 Age-related osteoporosis without current pathological fracture: Secondary | ICD-10-CM | POA: Diagnosis not present

## 2016-03-30 DIAGNOSIS — I872 Venous insufficiency (chronic) (peripheral): Secondary | ICD-10-CM | POA: Diagnosis not present

## 2016-03-30 DIAGNOSIS — K589 Irritable bowel syndrome without diarrhea: Secondary | ICD-10-CM | POA: Diagnosis not present

## 2016-03-30 DIAGNOSIS — G710 Muscular dystrophy: Secondary | ICD-10-CM | POA: Diagnosis not present

## 2016-03-30 DIAGNOSIS — R7301 Impaired fasting glucose: Secondary | ICD-10-CM | POA: Diagnosis not present

## 2016-03-30 DIAGNOSIS — Z1389 Encounter for screening for other disorder: Secondary | ICD-10-CM | POA: Diagnosis not present

## 2016-03-30 DIAGNOSIS — E784 Other hyperlipidemia: Secondary | ICD-10-CM | POA: Diagnosis not present

## 2016-03-30 DIAGNOSIS — I10 Essential (primary) hypertension: Secondary | ICD-10-CM | POA: Diagnosis not present

## 2016-03-30 DIAGNOSIS — E559 Vitamin D deficiency, unspecified: Secondary | ICD-10-CM | POA: Diagnosis not present

## 2016-03-30 DIAGNOSIS — Z6830 Body mass index (BMI) 30.0-30.9, adult: Secondary | ICD-10-CM | POA: Diagnosis not present

## 2016-05-27 DIAGNOSIS — H353122 Nonexudative age-related macular degeneration, left eye, intermediate dry stage: Secondary | ICD-10-CM | POA: Diagnosis not present

## 2016-05-27 DIAGNOSIS — H353112 Nonexudative age-related macular degeneration, right eye, intermediate dry stage: Secondary | ICD-10-CM | POA: Diagnosis not present

## 2016-05-27 DIAGNOSIS — H04123 Dry eye syndrome of bilateral lacrimal glands: Secondary | ICD-10-CM | POA: Diagnosis not present

## 2016-05-27 DIAGNOSIS — Z0100 Encounter for examination of eyes and vision without abnormal findings: Secondary | ICD-10-CM | POA: Diagnosis not present

## 2016-06-01 ENCOUNTER — Ambulatory Visit (INDEPENDENT_AMBULATORY_CARE_PROVIDER_SITE_OTHER): Payer: PPO | Admitting: Nurse Practitioner

## 2016-06-01 ENCOUNTER — Encounter: Payer: Self-pay | Admitting: Nurse Practitioner

## 2016-06-01 VITALS — BP 124/71 | HR 78 | Ht 62.0 in | Wt 164.2 lb

## 2016-06-01 DIAGNOSIS — Z9181 History of falling: Secondary | ICD-10-CM

## 2016-06-01 DIAGNOSIS — M5431 Sciatica, right side: Secondary | ICD-10-CM

## 2016-06-01 DIAGNOSIS — G7100 Muscular dystrophy, unspecified: Secondary | ICD-10-CM

## 2016-06-01 DIAGNOSIS — R269 Unspecified abnormalities of gait and mobility: Secondary | ICD-10-CM

## 2016-06-01 DIAGNOSIS — G7109 Other specified muscular dystrophies: Secondary | ICD-10-CM

## 2016-06-01 DIAGNOSIS — G71039 Limb girdle muscular dystrophy, unspecified: Secondary | ICD-10-CM

## 2016-06-01 NOTE — Patient Instructions (Signed)
Use walker at all times to prevent falls Follow-up in 1 year next visit with Dr. Brett Fairy

## 2016-06-01 NOTE — Progress Notes (Signed)
I agree with the assessment and plan as directed by NP .The patient is known to me .   Janelly Switalski, MD  

## 2016-06-01 NOTE — Progress Notes (Signed)
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  She has a history of limb girdle muscular dystrophy as well as a secondary scoliosis. The scoliosis has led to some stiffness in her lower extremities problems to arise from a chair or from a seated position. The stiffness is worse if she remained seated for a longer period of time. She reports that just driving to town for 30 or 40 minutes would be enough to have problems. She is however still driving, her problem is to enter and leave the car.   .In 2007 this established patient underwent a EMG and nerve conduction study, which showed only a mild peripheral neuropathy. Her neurologic symptoms are otherwise unchanged- she has the expected progression of gait difficulties, she does have a loss of balance /poor sense of balance and she is walking with a walker to stabilize both indoors and outdoors She has history of  sciatic nerve pain. Hip pain- treated with meloxicam and prednisone But only found relief after sterid injections.  She has had 3 falls in the 2014 , but none in 2015- and 2 in 2016 all occurred when she was not using her rolling walker. No falls so far for 2017.  She returns for reevaluation.   REVIEW OF SYSTEMS: Full 14 system review of systems performed and notable only for those listed, all others are neg:  Constitutional: Fatigue Cardiovascular: Leg swelling on the left Ear/Nose/Throat: neg  Skin: neg Eyes: neg Respiratory: neg Gastroitestinal: neg  Hematology/Lymphatic: neg  Endocrine: neg Musculoskeletal: walking difficulty Allergy/Immunology: neg Neurological: weakness Psychiatric: neg Sleep : neg   ALLERGIES: Allergies   Allergen Reactions  . Anesthetics, Amide   . Celebrex [Celecoxib]   . Ciprofloxacin Swelling  . Codeine Nausea And Vomiting  . Morphine And Related Nausea And Vomiting  . Sulfa Antibiotics     HOME MEDICATIONS: Outpatient Medications Prior to Visit  Medication Sig Dispense Refill  . Artificial Tear Solution (SYSTANE CONTACTS) SOLN Apply to eye.    . bacitracin ophthalmic ointment Place [id number] application into both eyes 3 (three) times daily.    . Calcium Carbonate Antacid (TUMS PO) Take by mouth daily as needed. chewable    . Calcium Carbonate-Vit D-Min (CALTRATE PLUS PO) Take by mouth.      . denosumab (PROLIA) 60 MG/ML SOLN injection Inject 60 mg into the skin every 6 (six) months. Administer in upper arm, thigh, or abdomen    . diltiazem (CARDIZEM CD) 240 MG 24 hr capsule Take 240 mg by mouth daily.      . ergocalciferol (VITAMIN D2) 50000 UNITS capsule Take 50,000 Units by mouth once a week.      Marland Kitchen FLUTICASONE PROPIONATE, NASAL, NA Place into the nose. 2 sprays 1/daily    . furosemide (LASIX) 20 MG tablet Take 20 mg by mouth 2 (two) times daily.    . hyoscyamine (LEVSIN SL) 0.125 MG SL tablet Place 0.125 mg under the tongue as needed.    . lansoprazole (PREVACID) 30 MG capsule Take 30 mg by mouth daily.    Marland Kitchen losartan-hydrochlorothiazide (HYZAAR) 100-12.5 MG per tablet Take 12.5 tablets by mouth daily.    . Methylcellulose, Laxative, (CITRUCEL PO) Take by mouth daily as needed. Calcium citrate+D, 2 times daily     .  Multiple Vitamins-Minerals (OCUVITE PRESERVISION PO) Take by mouth.      Vladimir Faster Glycol-Propyl Glycol 0.4-0.3 % SOLN Apply to eye.      . Prenatal Vit-Fe Fumarate-FA (MULTIVITAMIN-PRENATAL) 27-0.8 MG TABS tablet Take 1 tablet by mouth daily at 12 noon.    . Probiotic Product (ALIGN PO) Take by mouth.      . Psyllium (METAMUCIL PO) Take by mouth daily as needed.     . sodium chloride (AYR) 0.65 % nasal spray Place 1 spray into the nose as needed for congestion.    .  Triamcinolone Acetonide (NASACORT AQ NA) Place into the nose. As needed    . amoxicillin (AMOXIL) 500 MG capsule Take 500 mg by mouth 3 (three) times daily. Reported on 02/20/2016     No facility-administered medications prior to visit.     PAST MEDICAL HISTORY: Past Medical History:  Diagnosis Date  . Arthritis   . Atrophic vaginitis   . Basal cell carcinoma   . Cervical dysplasia   . Complication of anesthesia    pseudocholinesterase deficiency-hard to wake up  . Falls   . GERD (gastroesophageal reflux disease)   . Hypertension   . IBS (irritable bowel syndrome)   . Jaw atrophy   . Limb-girdle muscular dystrophy ([place]) 07/05/2013  . Macular hole of left eye   . Muscular dystrophy ([place])   . Osteoporosis    pelvic fracture  . Pseudocholinesterase deficiency   . Scoliosis     PAST SURGICAL HISTORY: Past Surgical History:  Procedure Laterality Date  . ABDOMINAL HYSTERECTOMY     TAH BSO  . BREAST LUMPECTOMY  1971   benign  . CESAREAN SECTION     x2  . COLPOSCOPY    . EYE SURGERY  2003   left  . TOTAL ABDOMINAL HYSTERECTOMY W/ BILATERAL SALPINGOOPHORECTOMY  1981  . TRIGGER FINGER RELEASE  07/28/2012   Procedure: RELEASE TRIGGER FINGER/A-1 PULLEY;  Surgeon: Cammie Sickle., MD;  Location: [facility];  Service: Orthopedics;  Laterality: Right;  EXCISION CYST RIGHT LONG A-1 RELEASE A-1 RIGHT LONG     FAMILY HISTORY: Family History  Problem Relation Age of Onset  . Hypertension Sister     SOCIAL HISTORY: Social History   Social History  . Marital status: Divorced    Spouse name: N/A  . Number of children: 3  . Years of education: 68   Occupational History  . retired     Theatre stage manager at [employer]  . Smoking status: Never Smoker  . Smokeless tobacco: Never Used  . Alcohol use No     Comment: rare  . Drug use: No  . Sexual activity: No   Other Topics Concern  . Not on file   Social History Narrative    Patient lives at home alone and she is divorced. Patient is retired.   Right handed.   Caffeine- one cup daily.   Uses walker.   Grown children, live 7 miles away.       PHYSICAL EXAM  Vitals:   06/01/16 1000  BP: 124/71  Pulse: 78  Weight: 164 lb 3.2 oz (74.5 kg)  Height: 5\' 2"  (1.575 m)   Body mass index is 30.03 kg/m. General: The patient is awake, alert and appears not in acute distress. The patient is well groomed.  Head: Normocephalic, atraumatic.  Neck is supple, no carotid bruit.  Cardiovascular: Regular rate and rhythm, without murmurs .  Skin:  1+ edema of left leg   Trunk: Severe scolisis, spinal stenosis.  Neurologic exam :  The patient is awake and alert, oriented to place and time. Memory subjective described as intact.  There is a normal attention span & concentration ability.  Speech is fluent without dysarthria, dysphonia or aphasia. Mood and affect are appropriate.  Cranial nerves: Pupils are equal and briskly reactive to light. Extraocular movements in vertical and horizontal planes intact and without nystagmus. Visual fields by finger perimetry are intact.  Hearing to finger rub intact. Facial sensation intact to fine touch. Facial motor strength is symmetric and tongue and uvula move midline.  Motor exam: Weak hip flexion and adduction,  Sensory: Fine touch, pinprick and vibration were affected in the feet  Coordination: Rapid alternating movements in the fingers/hands is normal. Finger-to-nose maneuver tested and normal without evidence of ataxia, dysmetria or tremor.  Gait and station: Patient walks with a walker - penguin gait, no difficulty with turns Deep tendon reflexes: in the upper and lower extremities are Brisk , symmetric and intact. No clonus  DIAGNOSTIC DATA (LABS, IMAGING, TESTING) -  ASSESSMENT AND PLAN  82 y.o. year old female  has a past medical history of  Muscular dystrophy ([place]);  Arthritis;  Hypertension; Scoliosis;  Limb-girdle muscular dystrophy ([place]) (07/05/2013); and Falls. here to follow-up.No falls so far for 2017.    Use walker at all times to prevent falls Follow-up in 1 year next with Dr. Brett Fairy Dennie Bible, [facility], [facility], [facility] [address] [place], [place] [address] [phone]

## 2016-06-02 ENCOUNTER — Ambulatory Visit (HOSPITAL_COMMUNITY): Admission: RE | Admit: 2016-06-02 | Payer: PPO | Source: Ambulatory Visit

## 2016-06-07 DIAGNOSIS — Z1231 Encounter for screening mammogram for malignant neoplasm of breast: Secondary | ICD-10-CM | POA: Diagnosis not present

## 2016-06-08 ENCOUNTER — Ambulatory Visit (HOSPITAL_COMMUNITY)
Admission: RE | Admit: 2016-06-08 | Discharge: 2016-06-08 | Disposition: A | Discharge status: Home / Self Care | Payer: PPO | Source: Ambulatory Visit | Attending: Endocrinology | Admitting: Endocrinology

## 2016-06-08 ENCOUNTER — Encounter (HOSPITAL_COMMUNITY): Payer: Self-pay

## 2016-06-08 DIAGNOSIS — M810 Age-related osteoporosis without current pathological fracture: Secondary | ICD-10-CM | POA: Insufficient documentation

## 2016-06-08 MED ORDER — DENOSUMAB 60 MG/ML ~~LOC~~ SOLN
60.0000 mg | Freq: Once | SUBCUTANEOUS | Status: AC
  Administered 2016-06-08: 60 mg via SUBCUTANEOUS
  Filled 2016-06-08: qty 1

## 2016-06-08 NOTE — Discharge Instructions (Signed)
PROLIA °Denosumab injection °What is this medicine? °DENOSUMAB (den oh sue mab) slows bone breakdown. Prolia is used to treat osteoporosis in women after menopause and in men. Xgeva is used to prevent bone fractures and other bone problems caused by cancer bone metastases. Xgeva is also used to treat giant cell tumor of the bone. °This medicine may be used for other purposes; ask your health care provider or pharmacist if you have questions. °What should I tell my health care provider before I take this medicine? °They need to know if you have any of these conditions: °-dental disease °-eczema °-infection or history of infections °-kidney disease or on dialysis °-low blood calcium or vitamin D °-malabsorption syndrome °-scheduled to have surgery or tooth extraction °-taking medicine that contains denosumab °-thyroid or parathyroid disease °-an unusual reaction to denosumab, other medicines, foods, dyes, or preservatives °-pregnant or trying to get pregnant °-breast-feeding °How should I use this medicine? °This medicine is for injection under the skin. It is given by a health care professional in a hospital or clinic setting. °If you are getting Prolia, a special MedGuide will be given to you by the pharmacist with each prescription and refill. Be sure to read this information carefully each time. °For Prolia, talk to your pediatrician regarding the use of this medicine in children. Special care may be needed. For Xgeva, talk to your pediatrician regarding the use of this medicine in children. While this drug may be prescribed for children as young as 13 years for selected conditions, precautions do apply. °Overdosage: If you think you have taken too much of this medicine contact a poison control center or emergency room at once. °NOTE: This medicine is only for you. Do not share this medicine with others. °What if I miss a dose? °It is important not to miss your dose. Call your doctor or health care professional if  you are unable to keep an appointment. °What may interact with this medicine? °Do not take this medicine with any of the following medications: °-other medicines containing denosumab °This medicine may also interact with the following medications: °-medicines that suppress the immune system °-medicines that treat cancer °-steroid medicines like prednisone or cortisone °This list may not describe all possible interactions. Give your health care provider a list of all the medicines, herbs, non-prescription drugs, or dietary supplements you use. Also tell them if you smoke, drink alcohol, or use illegal drugs. Some items may interact with your medicine. °What should I watch for while using this medicine? °Visit your doctor or health care professional for regular checks on your progress. Your doctor or health care professional may order blood tests and other tests to see how you are doing. °Call your doctor or health care professional if you get a cold or other infection while receiving this medicine. Do not treat yourself. This medicine may decrease your body's ability to fight infection. °You should make sure you get enough calcium and vitamin D while you are taking this medicine, unless your doctor tells you not to. Discuss the foods you eat and the vitamins you take with your health care professional. °See your dentist regularly. Brush and floss your teeth as directed. Before you have any dental work done, tell your dentist you are receiving this medicine. °Do not become pregnant while taking this medicine or for 5 months after stopping it. Women should inform their doctor if they wish to become pregnant or think they might be pregnant. There is a potential for serious side   effects to an unborn child. Talk to your health care professional or pharmacist for more information. °What side effects may I notice from receiving this medicine? °Side effects that you should report to your doctor or health care professional as  soon as possible: °-allergic reactions like skin rash, itching or hives, swelling of the face, lips, or tongue °-breathing problems °-chest pain °-fast, irregular heartbeat °-feeling faint or lightheaded, falls °-fever, chills, or any other sign of infection °-muscle spasms, tightening, or twitches °-numbness or tingling °-skin blisters or bumps, or is dry, peels, or red °-slow healing or unexplained pain in the mouth or jaw °-unusual bleeding or bruising °Side effects that usually do not require medical attention (Report these to your doctor or health care professional if they continue or are bothersome.): °-muscle pain °-stomach upset, gas °This list may not describe all possible side effects. Call your doctor for medical advice about side effects. You may report side effects to FDA at 1-800-FDA-1088. °Where should I keep my medicine? °This medicine is only given in a clinic, doctor's office, or other health care setting and will not be stored at home. °NOTE: This sheet is a summary. It may not cover all possible information. If you have questions about this medicine, talk to your doctor, pharmacist, or health care provider. °  °© 2016, Elsevier/Gold Standard. (2012-03-27 12:37:47) °Osteoporosis °Osteoporosis is the thinning and loss of density in the bones. Osteoporosis makes the bones more brittle, fragile, and likely to break (fracture). Over time, osteoporosis can cause the bones to become so weak that they fracture after a simple fall. The bones most likely to fracture are the bones in the hip, wrist, and spine. °CAUSES  °The exact cause is not known. °RISK FACTORS °Anyone can develop osteoporosis. You may be at greater risk if you have a family history of the condition or have poor nutrition. You may also have a higher risk if you are:  °· Female.   °· 50 years old or older. °· A smoker. °· Not physically active.   °· White or Asian. °· Slender. °SIGNS AND SYMPTOMS  °A fracture might be the first sign of the  disease, especially if it results from a fall or injury that would not usually cause a bone to break. Other signs and symptoms include:  °· Low back and neck pain. °· Stooped posture. °· Height loss. °DIAGNOSIS  °To make a diagnosis, your health care provider may: °· Take a medical history. °· Perform a physical exam. °· Order tests, such as: °¨ A bone mineral density test. °¨ A dual-energy X-ray absorptiometry test. °TREATMENT  °The goal of osteoporosis treatment is to strengthen your bones to reduce your risk of a fracture. Treatment may involve: °· Making lifestyle changes, such as: °¨ Eating a diet rich in calcium. °¨ Doing weight-bearing and muscle-strengthening exercises. °¨ Stopping tobacco use. °¨ Limiting alcohol intake. °· Taking medicine to slow the process of bone loss or to increase bone density. °· Monitoring your levels of calcium and vitamin D. °HOME CARE INSTRUCTIONS °· Include calcium and vitamin D in your diet. Calcium is important for bone health, and vitamin D helps the body absorb calcium. °· Perform weight-bearing and muscle-strengthening exercises as directed by your health care provider. °· Do not use any tobacco products, including cigarettes, chewing tobacco, and electronic cigarettes. If you need help quitting, ask your health care provider. °· Limit your alcohol intake. °· Take medicines only as directed by your health care provider. °· Keep all   follow-up visits as directed by your health care provider. This is important. °· Take precautions at home to lower your risk of falling, such as: °¨ Keeping rooms well lit and clutter free. °¨ Installing safety rails on stairs. °¨ Using rubber mats in the bathroom and other areas that are often wet or slippery. °SEEK IMMEDIATE MEDICAL CARE IF:  °You fall or injure yourself.  °  °This information is not intended to replace advice given to you by your health care provider. Make sure you discuss any questions you have with your health care  provider. °  °Document Released: 07/07/2005 Document Revised: 10/18/2014 Document Reviewed: 03/07/2014 °Elsevier Interactive Patient Education ©2016 Elsevier Inc. ° ° °

## 2016-06-18 DIAGNOSIS — Z6829 Body mass index (BMI) 29.0-29.9, adult: Secondary | ICD-10-CM | POA: Diagnosis not present

## 2016-06-18 DIAGNOSIS — J029 Acute pharyngitis, unspecified: Secondary | ICD-10-CM | POA: Diagnosis not present

## 2016-06-18 DIAGNOSIS — R6883 Chills (without fever): Secondary | ICD-10-CM | POA: Diagnosis not present

## 2016-06-18 DIAGNOSIS — J069 Acute upper respiratory infection, unspecified: Secondary | ICD-10-CM | POA: Diagnosis not present

## 2016-06-29 DIAGNOSIS — M85852 Other specified disorders of bone density and structure, left thigh: Secondary | ICD-10-CM | POA: Diagnosis not present

## 2016-07-22 DIAGNOSIS — L570 Actinic keratosis: Secondary | ICD-10-CM | POA: Diagnosis not present

## 2016-07-22 DIAGNOSIS — L821 Other seborrheic keratosis: Secondary | ICD-10-CM | POA: Diagnosis not present

## 2016-07-22 DIAGNOSIS — D2262 Melanocytic nevi of left upper limb, including shoulder: Secondary | ICD-10-CM | POA: Diagnosis not present

## 2016-07-22 DIAGNOSIS — D2271 Melanocytic nevi of right lower limb, including hip: Secondary | ICD-10-CM | POA: Diagnosis not present

## 2016-07-22 DIAGNOSIS — D2261 Melanocytic nevi of right upper limb, including shoulder: Secondary | ICD-10-CM | POA: Diagnosis not present

## 2016-07-22 DIAGNOSIS — L820 Inflamed seborrheic keratosis: Secondary | ICD-10-CM | POA: Diagnosis not present

## 2016-07-22 DIAGNOSIS — Z85828 Personal history of other malignant neoplasm of skin: Secondary | ICD-10-CM | POA: Diagnosis not present

## 2016-07-22 DIAGNOSIS — D2272 Melanocytic nevi of left lower limb, including hip: Secondary | ICD-10-CM | POA: Diagnosis not present

## 2016-08-24 DIAGNOSIS — R252 Cramp and spasm: Secondary | ICD-10-CM | POA: Diagnosis not present

## 2016-09-14 DIAGNOSIS — M9902 Segmental and somatic dysfunction of thoracic region: Secondary | ICD-10-CM | POA: Diagnosis not present

## 2016-09-14 DIAGNOSIS — M50322 Other cervical disc degeneration at C5-C6 level: Secondary | ICD-10-CM | POA: Diagnosis not present

## 2016-09-14 DIAGNOSIS — M9901 Segmental and somatic dysfunction of cervical region: Secondary | ICD-10-CM | POA: Diagnosis not present

## 2016-10-15 DIAGNOSIS — K219 Gastro-esophageal reflux disease without esophagitis: Secondary | ICD-10-CM | POA: Diagnosis not present

## 2016-10-15 DIAGNOSIS — R197 Diarrhea, unspecified: Secondary | ICD-10-CM | POA: Diagnosis not present

## 2016-10-18 DIAGNOSIS — R7301 Impaired fasting glucose: Secondary | ICD-10-CM | POA: Diagnosis not present

## 2016-10-18 DIAGNOSIS — I10 Essential (primary) hypertension: Secondary | ICD-10-CM | POA: Diagnosis not present

## 2016-10-18 DIAGNOSIS — E559 Vitamin D deficiency, unspecified: Secondary | ICD-10-CM | POA: Diagnosis not present

## 2016-10-18 DIAGNOSIS — E784 Other hyperlipidemia: Secondary | ICD-10-CM | POA: Diagnosis not present

## 2016-11-01 DIAGNOSIS — I10 Essential (primary) hypertension: Secondary | ICD-10-CM | POA: Diagnosis not present

## 2016-11-01 DIAGNOSIS — K760 Fatty (change of) liver, not elsewhere classified: Secondary | ICD-10-CM | POA: Diagnosis not present

## 2016-11-01 DIAGNOSIS — M810 Age-related osteoporosis without current pathological fracture: Secondary | ICD-10-CM | POA: Diagnosis not present

## 2016-11-01 DIAGNOSIS — Z0000 Encounter for general adult medical examination without abnormal findings: Secondary | ICD-10-CM | POA: Diagnosis not present

## 2016-11-01 DIAGNOSIS — G710 Muscular dystrophy: Secondary | ICD-10-CM | POA: Diagnosis not present

## 2016-11-01 DIAGNOSIS — Z23 Encounter for immunization: Secondary | ICD-10-CM | POA: Diagnosis not present

## 2016-11-01 DIAGNOSIS — E784 Other hyperlipidemia: Secondary | ICD-10-CM | POA: Diagnosis not present

## 2016-11-01 DIAGNOSIS — Z1389 Encounter for screening for other disorder: Secondary | ICD-10-CM | POA: Diagnosis not present

## 2016-11-01 DIAGNOSIS — Z6830 Body mass index (BMI) 30.0-30.9, adult: Secondary | ICD-10-CM | POA: Diagnosis not present

## 2016-11-01 DIAGNOSIS — N200 Calculus of kidney: Secondary | ICD-10-CM | POA: Diagnosis not present

## 2016-11-01 DIAGNOSIS — E559 Vitamin D deficiency, unspecified: Secondary | ICD-10-CM | POA: Diagnosis not present

## 2016-11-01 DIAGNOSIS — K589 Irritable bowel syndrome without diarrhea: Secondary | ICD-10-CM | POA: Diagnosis not present

## 2016-11-11 DIAGNOSIS — M9901 Segmental and somatic dysfunction of cervical region: Secondary | ICD-10-CM | POA: Diagnosis not present

## 2016-11-11 DIAGNOSIS — M5134 Other intervertebral disc degeneration, thoracic region: Secondary | ICD-10-CM | POA: Diagnosis not present

## 2016-11-11 DIAGNOSIS — M50322 Other cervical disc degeneration at C5-C6 level: Secondary | ICD-10-CM | POA: Diagnosis not present

## 2016-11-11 DIAGNOSIS — M9902 Segmental and somatic dysfunction of thoracic region: Secondary | ICD-10-CM | POA: Diagnosis not present

## 2016-11-22 DIAGNOSIS — Z1212 Encounter for screening for malignant neoplasm of rectum: Secondary | ICD-10-CM | POA: Diagnosis not present

## 2016-12-08 ENCOUNTER — Ambulatory Visit (HOSPITAL_COMMUNITY)
Admission: RE | Admit: 2016-12-08 | Discharge: 2016-12-08 | Disposition: A | Discharge status: Home / Self Care | Payer: PPO | Source: Ambulatory Visit | Attending: Endocrinology | Admitting: Endocrinology

## 2016-12-08 ENCOUNTER — Encounter (HOSPITAL_COMMUNITY): Payer: Self-pay

## 2016-12-08 DIAGNOSIS — M810 Age-related osteoporosis without current pathological fracture: Secondary | ICD-10-CM | POA: Insufficient documentation

## 2016-12-08 MED ORDER — DENOSUMAB 60 MG/ML ~~LOC~~ SOLN
60.0000 mg | Freq: Once | SUBCUTANEOUS | Status: AC
  Administered 2016-12-08: 60 mg via SUBCUTANEOUS
  Filled 2016-12-08: qty 1

## 2016-12-08 NOTE — Discharge Instructions (Signed)
Denosumab injection °What is this medicine? °DENOSUMAB (den oh sue mab) slows bone breakdown. Prolia is used to treat osteoporosis in women after menopause and in men. Xgeva is used to treat a high calcium level due to cancer and to prevent bone fractures and other bone problems caused by multiple myeloma or cancer bone metastases. Xgeva is also used to treat giant cell tumor of the bone. °This medicine may be used for other purposes; ask your health care provider or pharmacist if you have questions. °COMMON BRAND NAME(S): Prolia, XGEVA °What should I tell my health care provider before I take this medicine? °They need to know if you have any of these conditions: °-dental disease °-having surgery or tooth extraction °-infection °-kidney disease °-low levels of calcium or Vitamin D in the blood °-malnutrition °-on hemodialysis °-skin conditions or sensitivity °-thyroid or parathyroid disease °-an unusual reaction to denosumab, other medicines, foods, dyes, or preservatives °-pregnant or trying to get pregnant °-breast-feeding °How should I use this medicine? °This medicine is for injection under the skin. It is given by a health care professional in a hospital or clinic setting. °If you are getting Prolia, a special MedGuide will be given to you by the pharmacist with each prescription and refill. Be sure to read this information carefully each time. °For Prolia, talk to your pediatrician regarding the use of this medicine in children. Special care may be needed. For Xgeva, talk to your pediatrician regarding the use of this medicine in children. While this drug may be prescribed for children as young as 13 years for selected conditions, precautions do apply. °Overdosage: If you think you have taken too much of this medicine contact a poison control center or emergency room at once. °NOTE: This medicine is only for you. Do not share this medicine with others. °What if I miss a dose? °It is important not to miss your  dose. Call your doctor or health care professional if you are unable to keep an appointment. °What may interact with this medicine? °Do not take this medicine with any of the following medications: °-other medicines containing denosumab °This medicine may also interact with the following medications: °-medicines that lower your chance of fighting infection °-steroid medicines like prednisone or cortisone °This list may not describe all possible interactions. Give your health care provider a list of all the medicines, herbs, non-prescription drugs, or dietary supplements you use. Also tell them if you smoke, drink alcohol, or use illegal drugs. Some items may interact with your medicine. °What should I watch for while using this medicine? °Visit your doctor or health care professional for regular checks on your progress. Your doctor or health care professional may order blood tests and other tests to see how you are doing. °Call your doctor or health care professional for advice if you get a fever, chills or sore throat, or other symptoms of a cold or flu. Do not treat yourself. This drug may decrease your body's ability to fight infection. Try to avoid being around people who are sick. °You should make sure you get enough calcium and vitamin D while you are taking this medicine, unless your doctor tells you not to. Discuss the foods you eat and the vitamins you take with your health care professional. °See your dentist regularly. Brush and floss your teeth as directed. Before you have any dental work done, tell your dentist you are receiving this medicine. °Do not become pregnant while taking this medicine or for 5 months after stopping   it. Talk with your doctor or health care professional about your birth control options while taking this medicine. Women should inform their doctor if they wish to become pregnant or think they might be pregnant. There is a potential for serious side effects to an unborn child. Talk  to your health care professional or pharmacist for more information. What side effects may I notice from receiving this medicine? Side effects that you should report to your doctor or health care professional as soon as possible: -allergic reactions like skin rash, itching or hives, swelling of the face, lips, or tongue -bone pain -breathing problems -dizziness -jaw pain, especially after dental work -redness, blistering, peeling of the skin -signs and symptoms of infection like fever or chills; cough; sore throat; pain or trouble passing urine -signs of low calcium like fast heartbeat, muscle cramps or muscle pain; pain, tingling, numbness in the hands or feet; seizures -unusual bleeding or bruising -unusually weak or tired Side effects that usually do not require medical attention (report to your doctor or health care professional if they continue or are bothersome): -constipation -diarrhea -headache -joint pain -loss of appetite -muscle pain -runny nose -tiredness -upset stomach This list may not describe all possible side effects. Call your doctor for medical advice about side effects. You may report side effects to FDA at 1-800-FDA-1088. Where should I keep my medicine? This medicine is only given in a clinic, doctor's office, or other health care setting and will not be stored at home. NOTE: This sheet is a summary. It may not cover all possible information. If you have questions about this medicine, talk to your doctor, pharmacist, or health care provider.  2018 Elsevier/Gold Standard (2016-10-19 19:17:21)

## 2016-12-08 NOTE — Progress Notes (Signed)
Pt takes daily calcium and she takes her vitamin D twice a week per md instructions  Pt given d/c instructions on prolia.  prolia given 60mg  sq injection to left upper arm.  Pt was d/c ambulatory with her rolling walker to lobby.

## 2017-02-25 DIAGNOSIS — H35373 Puckering of macula, bilateral: Secondary | ICD-10-CM | POA: Diagnosis not present

## 2017-02-25 DIAGNOSIS — H5203 Hypermetropia, bilateral: Secondary | ICD-10-CM | POA: Diagnosis not present

## 2017-02-25 DIAGNOSIS — H04123 Dry eye syndrome of bilateral lacrimal glands: Secondary | ICD-10-CM | POA: Diagnosis not present

## 2017-02-25 DIAGNOSIS — H353112 Nonexudative age-related macular degeneration, right eye, intermediate dry stage: Secondary | ICD-10-CM | POA: Diagnosis not present

## 2017-05-02 DIAGNOSIS — N200 Calculus of kidney: Secondary | ICD-10-CM | POA: Diagnosis not present

## 2017-05-02 DIAGNOSIS — Z6830 Body mass index (BMI) 30.0-30.9, adult: Secondary | ICD-10-CM | POA: Diagnosis not present

## 2017-05-02 DIAGNOSIS — M810 Age-related osteoporosis without current pathological fracture: Secondary | ICD-10-CM | POA: Diagnosis not present

## 2017-05-02 DIAGNOSIS — I10 Essential (primary) hypertension: Secondary | ICD-10-CM | POA: Diagnosis not present

## 2017-05-02 DIAGNOSIS — E784 Other hyperlipidemia: Secondary | ICD-10-CM | POA: Diagnosis not present

## 2017-05-02 DIAGNOSIS — G710 Muscular dystrophy: Secondary | ICD-10-CM | POA: Diagnosis not present

## 2017-05-02 DIAGNOSIS — Z1389 Encounter for screening for other disorder: Secondary | ICD-10-CM | POA: Diagnosis not present

## 2017-05-02 DIAGNOSIS — H35349 Macular cyst, hole, or pseudohole, unspecified eye: Secondary | ICD-10-CM | POA: Diagnosis not present

## 2017-05-02 DIAGNOSIS — K589 Irritable bowel syndrome without diarrhea: Secondary | ICD-10-CM | POA: Diagnosis not present

## 2017-05-02 DIAGNOSIS — E559 Vitamin D deficiency, unspecified: Secondary | ICD-10-CM | POA: Diagnosis not present

## 2017-05-02 DIAGNOSIS — K760 Fatty (change of) liver, not elsewhere classified: Secondary | ICD-10-CM | POA: Diagnosis not present

## 2017-05-02 DIAGNOSIS — R7301 Impaired fasting glucose: Secondary | ICD-10-CM | POA: Diagnosis not present

## 2017-05-06 DIAGNOSIS — R1031 Right lower quadrant pain: Secondary | ICD-10-CM | POA: Diagnosis not present

## 2017-05-06 DIAGNOSIS — K5792 Diverticulitis of intestine, part unspecified, without perforation or abscess without bleeding: Secondary | ICD-10-CM | POA: Diagnosis not present

## 2017-05-06 DIAGNOSIS — K580 Irritable bowel syndrome with diarrhea: Secondary | ICD-10-CM | POA: Diagnosis not present

## 2017-05-06 DIAGNOSIS — K5730 Diverticulosis of large intestine without perforation or abscess without bleeding: Secondary | ICD-10-CM | POA: Diagnosis not present

## 2017-05-12 ENCOUNTER — Other Ambulatory Visit: Payer: Self-pay | Admitting: Physician Assistant

## 2017-05-12 DIAGNOSIS — R1031 Right lower quadrant pain: Secondary | ICD-10-CM

## 2017-05-12 DIAGNOSIS — K5792 Diverticulitis of intestine, part unspecified, without perforation or abscess without bleeding: Secondary | ICD-10-CM

## 2017-05-13 ENCOUNTER — Ambulatory Visit
Admission: RE | Admit: 2017-05-13 | Discharge: 2017-05-13 | Disposition: A | Discharge status: Home / Self Care | Payer: PPO | Source: Ambulatory Visit | Attending: Physician Assistant | Admitting: Physician Assistant

## 2017-05-13 DIAGNOSIS — K5792 Diverticulitis of intestine, part unspecified, without perforation or abscess without bleeding: Secondary | ICD-10-CM

## 2017-05-13 DIAGNOSIS — R1031 Right lower quadrant pain: Secondary | ICD-10-CM

## 2017-05-13 DIAGNOSIS — K5730 Diverticulosis of large intestine without perforation or abscess without bleeding: Secondary | ICD-10-CM | POA: Diagnosis not present

## 2017-05-13 MED ORDER — IOPAMIDOL (ISOVUE-300) INJECTION 61%
100.0000 mL | Freq: Once | INTRAVENOUS | Status: AC | PRN
  Administered 2017-05-13: 100 mL via INTRAVENOUS

## 2017-05-18 DIAGNOSIS — R1031 Right lower quadrant pain: Secondary | ICD-10-CM | POA: Diagnosis not present

## 2017-05-18 DIAGNOSIS — K580 Irritable bowel syndrome with diarrhea: Secondary | ICD-10-CM | POA: Diagnosis not present

## 2017-05-18 DIAGNOSIS — E876 Hypokalemia: Secondary | ICD-10-CM | POA: Diagnosis not present

## 2017-05-18 DIAGNOSIS — K649 Unspecified hemorrhoids: Secondary | ICD-10-CM | POA: Diagnosis not present

## 2017-05-25 DIAGNOSIS — Z961 Presence of intraocular lens: Secondary | ICD-10-CM | POA: Diagnosis not present

## 2017-05-25 DIAGNOSIS — H35371 Puckering of macula, right eye: Secondary | ICD-10-CM | POA: Diagnosis not present

## 2017-05-25 DIAGNOSIS — H353112 Nonexudative age-related macular degeneration, right eye, intermediate dry stage: Secondary | ICD-10-CM | POA: Diagnosis not present

## 2017-05-30 DIAGNOSIS — K580 Irritable bowel syndrome with diarrhea: Secondary | ICD-10-CM | POA: Diagnosis not present

## 2017-05-30 DIAGNOSIS — R1032 Left lower quadrant pain: Secondary | ICD-10-CM | POA: Diagnosis not present

## 2017-05-30 DIAGNOSIS — R509 Fever, unspecified: Secondary | ICD-10-CM | POA: Diagnosis not present

## 2017-06-01 ENCOUNTER — Ambulatory Visit (INDEPENDENT_AMBULATORY_CARE_PROVIDER_SITE_OTHER): Payer: PPO | Admitting: Neurology

## 2017-06-01 ENCOUNTER — Encounter: Payer: Self-pay | Admitting: Neurology

## 2017-06-01 VITALS — BP 132/78 | HR 91 | Ht 61.0 in | Wt 155.0 lb

## 2017-06-01 DIAGNOSIS — G710 Muscular dystrophy: Secondary | ICD-10-CM

## 2017-06-01 DIAGNOSIS — R8290 Unspecified abnormal findings in urine: Secondary | ICD-10-CM | POA: Diagnosis not present

## 2017-06-01 DIAGNOSIS — G7109 Other specified muscular dystrophies: Secondary | ICD-10-CM

## 2017-06-01 NOTE — Progress Notes (Signed)
[facility]  [name] DOB: April 10, 1930   REASON FOR VISIT: Followup for gait abnormality. Interval history from 06/01/2017, I have the pleasure of seeing Catherine Harding N/A scheduled yearly revisit, her last 2 appointments were withadditional Catherine Harding. The patient states that her gait has not further deteriorated, she is using a seated walker, she does have abdominal and flank pain and was thought to suffer from diverticulitis, but his CT of the abdomen has not confirmed the presence of inflammatory changes. She now contracted a urinary tract infection, she has had a difficult summer due to abnormal digestion and infection. She is seeing dr Paulita Fujita GI @ [facility] today.  The patient had developed hip pain in the year 2014 and had multiple falls that year, but steroid injections into the hip has helped greatly and she had no recurrence since. Her last fall was last November.  She now uses the walker regularly outside of the home, in home she " wobbles ' from wall to furniture. She has had dizzyspells. Improved after d/c Hyoscyamines.   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 She was last seen by me on 07/05/2013. 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 The scoliosis has led to some stiffness in her lower extremities problems to arise from a chair or from a seated position. The stiffness is worse if she remained seated for a longer period of time. She reports that just driving to town for 30 or 40 minutes would be enough to have problems. She is however still driving, her problem is to enter and leave the car. In 2007 this established patient underwent a EMG and nerve conduction study, which showed only  a mild peripheral neuropathy. Her neurologic symptoms are otherwise unchanged- she has the expected progression of gait difficulties,  she  does have a loss of balance /poor sense of balance and she is walking with a walker to stabilize. She reports using a cane at home. Within the boundaries of her home she can maneuver by holding onto the wall or furniture if needed. She does not use an assistive device per se indoors. She reports sciatic nerve pain. Hip pain- treated with meloxicam and prednisone  But only found relief after sterid injections.    she has had 3 falls in the 2014 , but none in 2015- and all occurred when she was not using her cane outdoors.   2016 ;Patients sister was never diagnosed with Limb Girdle-Muscular Dystrophy, but died wheelchair bound at age 4 in 60. Another sister is still alive at age 58 and in good shape. Catherine Harding is now 52. Neither child not grandchild have any signs of muscle atrophy.  The patient has a history of walking " funny " for probably over a decade may be to. But she was only diagnosed after presenting to the neurology office 9 years ago. There is a family history as her sister was probably affected by the same dystrophy type. None of her children have shown any symptoms at this time and her grandchildren are unaffected also.  Two of her children have  scoliosis , which can be an early symptom. It was her then 41 year old grandson who spotted and named her gait " Dynegy "  She does report left-sided sciatica and she has a very palpable tender spot at L5 L4. The patella reflex is  preserved. She found some relief after an injection. I would like for her to have physical therapy and perhaps massage therapy. Will refer to PT , she had not been seen by neuro rehab.      REVIEW OF SYSTEMS: Full 14 system review of systems performed and notable only for those listed, all others are neg:  Constitutional: Occasional fatigue Abdominal cramping.  Musculoskeletal: Muscle cramps, walking difficulty, scoliosis, sciatica .  Allergy/Immunology: N/A  Neurological: Weakness of gait, limb girdle  distribution,"  penguin gait "   ALLERGIES: Allergies  Allergen Reactions  . Anesthetics, Amide   . Celebrex [Celecoxib]   . Ciprofloxacin Swelling  . Codeine Nausea And Vomiting  . Morphine And Related Nausea And Vomiting  . Sulfa Antibiotics     HOME MEDICATIONS: Outpatient Medications Prior to Visit  Medication Sig Dispense Refill  . Artificial Tear Solution (SYSTANE CONTACTS) SOLN Apply to eye.    . bacitracin ophthalmic ointment Place 962 application into both eyes 3 (three) times daily.    . Calcium Carbonate Antacid (TUMS PO) Take by mouth daily as needed. chewable    . Calcium Carbonate-Vit D-Min (CALTRATE PLUS PO) Take by mouth.      . denosumab (PROLIA) 60 MG/ML SOLN injection Inject 60 mg into the skin every 6 (six) months. Administer in upper arm, thigh, or abdomen    . diltiazem (CARDIZEM CD) 240 MG 24 hr capsule Take 240 mg by mouth daily.      . ergocalciferol (VITAMIN D2) 50000 UNITS capsule Take 50,000 Units by mouth once a week.      Marland Kitchen FLUTICASONE PROPIONATE, NASAL, NA Place into the nose. 2 sprays 1/daily    . furosemide (LASIX) 20 MG tablet Take 20 mg by mouth 2 (two) times daily.    . lansoprazole (PREVACID) 30 MG capsule Take 30 mg by mouth daily.    Marland Kitchen losartan-hydrochlorothiazide (HYZAAR) 100-12.5 MG per tablet Take 12.5 tablets by mouth daily.    Vladimir Faster Glycol-Propyl Glycol 0.4-0.3 % SOLN Apply to eye.      . Probiotic Product (ALIGN PO) Take by mouth.      . sodium chloride (AYR) 0.65 % nasal spray Place 1 spray into the nose as needed for congestion.    . Triamcinolone Acetonide (NASACORT AQ NA) Place into the nose. As needed    . hyoscyamine (LEVSIN SL) 0.125 MG SL tablet Place 0.125 mg under the tongue as needed.    . Methylcellulose, Laxative, (CITRUCEL PO) Take by mouth daily as needed. Calcium citrate+D, 2 times daily     . Multiple Vitamins-Minerals (OCUVITE PRESERVISION PO) Take by mouth.      . Prenatal Vit-Fe Fumarate-FA  (MULTIVITAMIN-PRENATAL) 27-0.8 MG TABS tablet Take 1 tablet by mouth daily at 12 noon.    . Psyllium (METAMUCIL PO) Take by mouth daily as needed.      No facility-administered medications prior to visit.     PAST MEDICAL HISTORY: Past Medical History:  Diagnosis Date  . Arthritis   . Atrophic vaginitis   . Basal cell carcinoma   . Cervical dysplasia   . Complication of anesthesia    pseudocholinesterase deficiency-hard to wake up  . Falls   . GERD (gastroesophageal reflux disease)   . Hypertension   . IBS (irritable bowel syndrome)   . Jaw atrophy   . Limb-girdle muscular dystrophy ([place]) 07/05/2013  . Macular hole of left eye   . Muscular dystrophy ([place])   . Osteoporosis    pelvic fracture  .  Pseudocholinesterase deficiency   . Scoliosis     PAST SURGICAL HISTORY: Past Surgical History:  Procedure Laterality Date  . ABDOMINAL HYSTERECTOMY     TAH BSO  . BREAST LUMPECTOMY  1971   benign  . CESAREAN SECTION     x2  . COLPOSCOPY    . EYE SURGERY  2003   left  . TOTAL ABDOMINAL HYSTERECTOMY W/ BILATERAL SALPINGOOPHORECTOMY  1981  . TRIGGER FINGER RELEASE  07/28/2012   Procedure: RELEASE TRIGGER FINGER/A-1 PULLEY;  Surgeon: Cammie Sickle., MD;  Location: [facility];  Service: Orthopedics;  Laterality: Right;  EXCISION CYST RIGHT LONG A-1 RELEASE A-1 RIGHT LONG     FAMILY HISTORY: Family History  Problem Relation Age of Onset  . Hypertension Sister     SOCIAL HISTORY: Social History   Social History  . Marital status: Divorced    Spouse name: N/A  . Number of children: 3  . Years of education: 60   Occupational History  . retired     Theatre stage manager at [employer]  . Smoking status: Never Smoker  . Smokeless tobacco: Never Used  . Alcohol use No     Comment: rare  . Drug use: No  . Sexual activity: No   Other Topics Concern  . Not on file   Social History Narrative   Patient lives at home alone and  she is divorced. Patient is retired.   Right handed.   Caffeine- one cup daily.   Uses walker.   Grown children, live 7 miles away.       PHYSICAL EXAM  Vitals:   06/01/17 0952  BP: 132/78  Pulse: 91  Weight: 155 lb (70.3 kg)  Height: 5\' 1"  (1.549 m)   Body mass index is 29.29 kg/m. General: The patient is awake, alert and appears not in acute distress. The patient is well groomed.  Head: Normocephalic, atraumatic.  Neck is supple.  16 inch circumference.  Cardiovascular: Regular rate and rhythm, without murmurs or carotid bruit, and without distended neck veins.  Respiratory: Lungs are clear to auscultation.  Skin: Without evidence of edema, or rash  Trunk:  Severe scolisis, spinal stenosis.  Neurologic exam :  The patient is awake and alert, oriented to place and time. Memory subjective described as intact.  There is a normal attention span & concentration ability.  Speech is fluent without dysarthria, dysphonia or aphasia. Mood and affect are appropriate.  Cranial nerves:  No change in taste and smell. Pupils are equal and briskly reactive to light.  Visual fields by finger perimetry are intact.  Hearing to finger rub intact. Facial sensation intact to fine touch. Facial motor strength is symmetric and tongue and uvula move midline.  Motor exam: Very weak hip flexion and adduction,  Sensory: Fine touch, pinprick and vibration were affected in the feet  Coordination: Rapid alternating movements in the fingers/hands is normal. Finger-to-nose maneuver tested and normal without evidence of ataxia, dysmetria or tremor.  Gait and station: Patient walks with a walker - penguin gait, as named by her 42 year old grandson in 2007. Deep tendon reflexes: in the upper and lower extremities are Brisk , symmetric and intact.  No clonus.    DIAGNOSTIC DATA (LABS, IMAGING, TESTING) - 25 minute visit with more than 50% of the face to face time dedicated to the new complaint of sciatica ,  right hip and leg, and the established complaint of limb-girdle muscular  dystrophy was ataxic gait disorder.  The patient has a very wide-based gait and would be at high fall risk.  ASSESSMENT AND PLAN   83 y.o. year old female has probably  irritable bowel syndrome,  but not sigmoid inflammation , has further work up with Dr.  Paulita Fujita today.  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  With Scoliosis  And  Sciatica .    F/U in 1 year With Dennie Bible, [facility] [address], [place], [place] [address] [phone]

## 2017-06-01 NOTE — Patient Instructions (Signed)
Muscular Dystrophy Muscular dystrophy is any condition in a group of diseases that are characterized by muscle weakness and muscle loss. Most types of muscular dystrophy begin to cause symptoms during childhood. Some types do not start causing symptoms until later in life. Different types of muscular dystrophy affect different muscles. Muscle weakness and muscle loss can occur in the muscles that are used for body movements (voluntary muscles) or in the muscles that are used for breathing and heartbeat (involuntary muscles). Some types of muscular dystrophy are more severe than others. Most types tend to get worse over time. Some people may eventually have trouble walking. For some people, muscular dystrophy can affect the heart or the lungs. The most common types of muscular dystrophy include:  Duchenne muscular dystrophy. This is the most common type. Symptoms usually begin by age 5 and start in the upper arms and legs.  Becker muscular dystrophy. This type is usually milder than Duchenne. Symptoms usually begin by age 12 and start in the arms and legs.  Myotonic muscular dystrophy. This is the most common type that affects adults. Symptoms usually begin by age 30. This type makes muscles tight and unable to relax. It usually starts in the arms, face, hands, or legs.  Limb-girdle muscular dystrophy. Weakness may begin in childhood or in adult years. This type starts in the hips and shoulders.  Facioscapulohumeral muscular dystrophy. This type affects the face and shoulders. Symptoms may start in childhood or adult years.  Congenital muscular dystrophy. This type is present at birth. It causes muscle weakness in the upper body and legs.  What are the causes? All types of muscular dystrophy are caused by changes (mutations) in the genes that are needed to make certain proteins for healthy muscles.  Different mutations cause different types of muscular dystrophy.  The gene mutation that causes  muscular dystrophy may be passed down through a family (inherited), or it may develop spontaneously. A spontaneous gene mutation may then be inherited.  What increases the risk? This condition is more likely to develop in:  People who have a family history of the condition.  Males. The most common types of muscular dystrophy occur more often in boys than in girls.  What are the signs or symptoms? Symptoms vary depending on the type of muscular dystrophy. Early signs and symptoms of muscular dystrophy in childhood (Duchenne and Becker) include:  Frequent falling or clumsiness.  Having a hard time getting up into a standing position.  Trouble running, walking, jumping, or climbing stairs.  A waddling walk.  Walking on toes.  Painful or stiff muscles.  Symptoms of more advanced muscular dystrophy may include:  Becoming unable to walk.  Curving of the spine.  Trouble swallowing.  Trouble breathing.  Heart failure.  How is this diagnosed? This condition may be diagnosed with:  A physical exam. This may include tests to check muscle strength and to observe the way that you walk (gait).  Blood tests to check for: ? Evidence of muscle damage (creatine kinaseenzymes). ? Genetic mutations.  Tests to measure the electrical activity of your muscles (electromyogram).  A procedure to remove a sample of muscle fibers to be examined under a microscope (muscle biopsy).  Heart and lung function tests.  How is this treated? There is no cure for this condition. However, treatment may help you stay active and may slow down the advance of the disease. Treatment may include:  Range-of-motion, stretching, and strengthening exercises (physical therapy).  Assistive devices, such   as braces, canes, walkers, or wheelchairs.  Medicines to reduce swelling (steroids).  Medicines to improve heart function and bone health.  A breathing machine (ventilator).  Surgery to correct or  prevent curving of the spine.  A feeding tube to assist with proper nutrition.  Counseling to help address mental health issues that result from the condition or occur as side effects of treatment.  Follow these instructions at home:  Learn as much as you can about muscular dystrophy, and work closely with your treatment team. Home care needs may change over time.  Try to stay physically active. Ask your health care provider what level of physical activity is safe for you.  Do physical therapy exercises at home as directed by your physical therapist.  Try to maintain a healthy weight. Ask your health care provider for advice about diet.  Ask your health care provider to recommend resources for emotional and caregiving support. A good source for finding support is the Muscular Dystrophy Association at the following web address: ? www.mda.org/services/finding-support  Keep all follow-up visits as directed by your health care provider. This is important. Contact a health care provider if:  Your symptoms change.  You have trouble staying active at home. Get help right away if:  You have problems with breathing or swallowing.  It is no longer safe or possible for you to stay active at home. This information is not intended to replace advice given to you by your health care provider. Make sure you discuss any questions you have with your health care provider. Document Released: 09/17/2002 Document Revised: 03/04/2016 Document Reviewed: 10/02/2014 Elsevier Interactive Patient Education  2018 Elsevier Inc.  

## 2017-06-08 ENCOUNTER — Ambulatory Visit (HOSPITAL_COMMUNITY)
Admission: RE | Admit: 2017-06-08 | Discharge: 2017-06-08 | Disposition: A | Discharge status: Home / Self Care | Payer: PPO | Source: Ambulatory Visit | Attending: Endocrinology | Admitting: Endocrinology

## 2017-06-08 ENCOUNTER — Encounter (HOSPITAL_COMMUNITY): Payer: Self-pay

## 2017-06-08 DIAGNOSIS — Z1231 Encounter for screening mammogram for malignant neoplasm of breast: Secondary | ICD-10-CM | POA: Diagnosis not present

## 2017-06-08 DIAGNOSIS — M810 Age-related osteoporosis without current pathological fracture: Secondary | ICD-10-CM | POA: Insufficient documentation

## 2017-06-08 MED ORDER — DENOSUMAB 60 MG/ML ~~LOC~~ SOLN
60.0000 mg | Freq: Once | SUBCUTANEOUS | Status: AC
  Administered 2017-06-08: 60 mg via SUBCUTANEOUS
  Filled 2017-06-08: qty 1

## 2017-06-08 NOTE — Discharge Instructions (Signed)
° ° °Prolia °Denosumab injection °What is this medicine? °DENOSUMAB (den oh sue mab) slows bone breakdown. Prolia is used to treat osteoporosis in women after menopause and in men. Xgeva is used to treat a high calcium level due to cancer and to prevent bone fractures and other bone problems caused by multiple myeloma or cancer bone metastases. Xgeva is also used to treat giant cell tumor of the bone. °This medicine may be used for other purposes; ask your health care provider or pharmacist if you have questions. °COMMON BRAND NAME(S): Prolia, XGEVA °What should I tell my health care provider before I take this medicine? °They need to know if you have any of these conditions: °-dental disease °-having surgery or tooth extraction °-infection °-kidney disease °-low levels of calcium or Vitamin D in the blood °-malnutrition °-on hemodialysis °-skin conditions or sensitivity °-thyroid or parathyroid disease °-an unusual reaction to denosumab, other medicines, foods, dyes, or preservatives °-pregnant or trying to get pregnant °-breast-feeding °How should I use this medicine? °This medicine is for injection under the skin. It is given by a health care professional in a hospital or clinic setting. °If you are getting Prolia, a special MedGuide will be given to you by the pharmacist with each prescription and refill. Be sure to read this information carefully each time. °For Prolia, talk to your pediatrician regarding the use of this medicine in children. Special care may be needed. For Xgeva, talk to your pediatrician regarding the use of this medicine in children. While this drug may be prescribed for children as young as 13 years for selected conditions, precautions do apply. °Overdosage: If you think you have taken too much of this medicine contact a poison control center or emergency room at once. °NOTE: This medicine is only for you. Do not share this medicine with others. °What if I miss a dose? °It is important not  to miss your dose. Call your doctor or health care professional if you are unable to keep an appointment. °What may interact with this medicine? °Do not take this medicine with any of the following medications: °-other medicines containing denosumab °This medicine may also interact with the following medications: °-medicines that lower your chance of fighting infection °-steroid medicines like prednisone or cortisone °This list may not describe all possible interactions. Give your health care provider a list of all the medicines, herbs, non-prescription drugs, or dietary supplements you use. Also tell them if you smoke, drink alcohol, or use illegal drugs. Some items may interact with your medicine. °What should I watch for while using this medicine? °Visit your doctor or health care professional for regular checks on your progress. Your doctor or health care professional may order blood tests and other tests to see how you are doing. °Call your doctor or health care professional for advice if you get a fever, chills or sore throat, or other symptoms of a cold or flu. Do not treat yourself. This drug may decrease your body's ability to fight infection. Try to avoid being around people who are sick. °You should make sure you get enough calcium and vitamin D while you are taking this medicine, unless your doctor tells you not to. Discuss the foods you eat and the vitamins you take with your health care professional. °See your dentist regularly. Brush and floss your teeth as directed. Before you have any dental work done, tell your dentist you are receiving this medicine. °Do not become pregnant while taking this medicine or for 5   after stopping it. Talk with your doctor or health care professional about your birth control options while taking this medicine. Women should inform their doctor if they wish to become pregnant or think they might be pregnant. There is a potential for serious side effects to an unborn  child. Talk to your health care professional or pharmacist for more information. What side effects may I notice from receiving this medicine? Side effects that you should report to your doctor or health care professional as soon as possible: -allergic reactions like skin rash, itching or hives, swelling of the face, lips, or tongue -bone pain -breathing problems -dizziness -jaw pain, especially after dental work -redness, blistering, peeling of the skin -signs and symptoms of infection like fever or chills; cough; sore throat; pain or trouble passing urine -signs of low calcium like fast heartbeat, muscle cramps or muscle pain; pain, tingling, numbness in the hands or feet; seizures -unusual bleeding or bruising -unusually weak or tired Side effects that usually do not require medical attention (report to your doctor or health care professional if they continue or are bothersome): -constipation -diarrhea -headache -joint pain -loss of appetite -muscle pain -runny nose -tiredness -upset stomach This list may not describe all possible side effects. Call your doctor for medical advice about side effects. You may report side effects to FDA at 1-800-FDA-1088. Where should I keep my medicine? This medicine is only given in a clinic, doctor's office, or other health care setting and will not be stored at home. NOTE: This sheet is a summary. It may not cover all possible information. If you have questions about this medicine, talk to your doctor, pharmacist, or health care provider.  2018 Elsevier/Gold Standard (2016-10-19 19:17:21)

## 2017-06-15 DIAGNOSIS — K219 Gastro-esophageal reflux disease without esophagitis: Secondary | ICD-10-CM | POA: Diagnosis not present

## 2017-07-27 DIAGNOSIS — L821 Other seborrheic keratosis: Secondary | ICD-10-CM | POA: Diagnosis not present

## 2017-07-27 DIAGNOSIS — D2371 Other benign neoplasm of skin of right lower limb, including hip: Secondary | ICD-10-CM | POA: Diagnosis not present

## 2017-07-27 DIAGNOSIS — M9901 Segmental and somatic dysfunction of cervical region: Secondary | ICD-10-CM | POA: Diagnosis not present

## 2017-07-27 DIAGNOSIS — D692 Other nonthrombocytopenic purpura: Secondary | ICD-10-CM | POA: Diagnosis not present

## 2017-07-27 DIAGNOSIS — K580 Irritable bowel syndrome with diarrhea: Secondary | ICD-10-CM | POA: Diagnosis not present

## 2017-07-27 DIAGNOSIS — M5033 Other cervical disc degeneration, cervicothoracic region: Secondary | ICD-10-CM | POA: Diagnosis not present

## 2017-07-27 DIAGNOSIS — D2261 Melanocytic nevi of right upper limb, including shoulder: Secondary | ICD-10-CM | POA: Diagnosis not present

## 2017-07-27 DIAGNOSIS — Z85828 Personal history of other malignant neoplasm of skin: Secondary | ICD-10-CM | POA: Diagnosis not present

## 2017-07-27 DIAGNOSIS — L814 Other melanin hyperpigmentation: Secondary | ICD-10-CM | POA: Diagnosis not present

## 2017-07-27 DIAGNOSIS — D1801 Hemangioma of skin and subcutaneous tissue: Secondary | ICD-10-CM | POA: Diagnosis not present

## 2017-07-27 DIAGNOSIS — D225 Melanocytic nevi of trunk: Secondary | ICD-10-CM | POA: Diagnosis not present

## 2017-07-27 DIAGNOSIS — D2271 Melanocytic nevi of right lower limb, including hip: Secondary | ICD-10-CM | POA: Diagnosis not present

## 2017-07-27 DIAGNOSIS — L570 Actinic keratosis: Secondary | ICD-10-CM | POA: Diagnosis not present

## 2017-07-27 DIAGNOSIS — K219 Gastro-esophageal reflux disease without esophagitis: Secondary | ICD-10-CM | POA: Diagnosis not present

## 2017-07-27 DIAGNOSIS — D2272 Melanocytic nevi of left lower limb, including hip: Secondary | ICD-10-CM | POA: Diagnosis not present

## 2017-07-27 DIAGNOSIS — D2262 Melanocytic nevi of left upper limb, including shoulder: Secondary | ICD-10-CM | POA: Diagnosis not present

## 2017-09-14 DIAGNOSIS — M9901 Segmental and somatic dysfunction of cervical region: Secondary | ICD-10-CM | POA: Diagnosis not present

## 2017-09-14 DIAGNOSIS — M5031 Other cervical disc degeneration,  high cervical region: Secondary | ICD-10-CM | POA: Diagnosis not present

## 2017-10-13 ENCOUNTER — Other Ambulatory Visit: Payer: Self-pay

## 2017-10-13 ENCOUNTER — Other Ambulatory Visit: Payer: Self-pay | Admitting: *Deleted

## 2017-10-13 NOTE — Patient Outreach (Addendum)
[facility]) Care Management  10/13/2017  Catherine Harding Mar 16, 1930 [id number]   Patient referred to this social worker by telephonic RNCM to provide patient with transportation resources to medical appointments. Per patient, she drives and also has family members that are often available to provide transportation. However, at times her family members have to work. Patient also discussed that there are times when she is just not physically able to drive herself. Per patient, she is on a fixed income and would need a resource that was affordable. This Education officer, museum discussed a referral to the [employer] program. Patient agrees to a referral to this program and is Patent attorney. Non-medical transportation also discussed through the [facility] as well as commercial transportation companies.  Shop on-line and pick up program through the grocery store also explored, however per patient, she is not interested in that service at this time.  Patient verbalized having no additional needs at this time.      Plan: Referral to be completed to the [employer] program through the [facility].    Catherine Harding [facility] Care Management [phone]

## 2017-10-13 NOTE — Patient Outreach (Signed)
[facility]) Care Management  10/13/2017  MARITES NATH [birth year] [id number]   TELEPHONE SCREENING Referral date: 10/13/17 Referral source: [employer] referral Referral reason:Referral to audiology for hearing aids Insurance: Health team advantage  Telephone call to patient regarding [employer] referral. HIPAA verified by patient. Discussed [facility] care management services with patient. Patient states she has just started having difficulty with her hearing. Patient states she is aware hearing aids are expensive and would not be able to afford. RNCM advised patient to contact her health team advantage concierge located on her health insurance card to inquire if she has coverage through her health plan. Patient verbalized understanding.  Patient states she has high blood pressure that is being managed with medication. 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  Patient reports 1 fall within the past 3 months. Patient states she has had several falls prior to that. Patient states she uses a Engineer, civil (consulting) for ambulation. Patient states has home safety equipment to assist her such as grab bar, life line, and bed side commode.  Patient states she would like to have the [facility] care management social worker contact her regarding transportation assistance. Patient states she is able to drive herself with some restrictions. Patient reports she also has transportation assistance from family.Patient states she would like to have additional  assistance for transportation to her doctors appointment in the event its needed.  Patient denied need for community case management for home safety evaluation  PLAN: RNCM will refer patient to social worker for transportation assistance.   Quinn Plowman RN,BSN,CCM [facility] Telephonic  [phone]

## 2017-10-13 NOTE — Patient Outreach (Signed)
[facility]) Care Management  10/13/2017  Catherine Harding June 01, 1930 [id number]   Phone call to the [place] to refer patient to their [employer] transportation program.  Referral completed, spoke to [place] who stated that a information packet will be mailed to patient's home. She will need to complete and sign the waiver in the self addressed envelope and return it to the [employer]. Once received, she can begin to schedule rides.   Plan: This Education officer, museum will follow up with patient within 2 weeks to ensure that she has received the information packet.   Sheralyn Boatman [facility] Care Management [phone]

## 2017-10-19 DIAGNOSIS — K589 Irritable bowel syndrome without diarrhea: Secondary | ICD-10-CM | POA: Diagnosis not present

## 2017-10-19 DIAGNOSIS — K219 Gastro-esophageal reflux disease without esophagitis: Secondary | ICD-10-CM | POA: Diagnosis not present

## 2017-10-24 DIAGNOSIS — I10 Essential (primary) hypertension: Secondary | ICD-10-CM | POA: Diagnosis not present

## 2017-10-24 DIAGNOSIS — E559 Vitamin D deficiency, unspecified: Secondary | ICD-10-CM | POA: Diagnosis not present

## 2017-10-24 DIAGNOSIS — H35349 Macular cyst, hole, or pseudohole, unspecified eye: Secondary | ICD-10-CM | POA: Diagnosis not present

## 2017-10-24 DIAGNOSIS — I5189 Other ill-defined heart diseases: Secondary | ICD-10-CM | POA: Diagnosis not present

## 2017-10-24 DIAGNOSIS — Z1389 Encounter for screening for other disorder: Secondary | ICD-10-CM | POA: Diagnosis not present

## 2017-10-24 DIAGNOSIS — K589 Irritable bowel syndrome without diarrhea: Secondary | ICD-10-CM | POA: Diagnosis not present

## 2017-10-24 DIAGNOSIS — G718 Other primary disorders of muscles: Secondary | ICD-10-CM | POA: Diagnosis not present

## 2017-10-24 DIAGNOSIS — M810 Age-related osteoporosis without current pathological fracture: Secondary | ICD-10-CM | POA: Diagnosis not present

## 2017-10-24 DIAGNOSIS — R7301 Impaired fasting glucose: Secondary | ICD-10-CM | POA: Diagnosis not present

## 2017-10-24 DIAGNOSIS — K760 Fatty (change of) liver, not elsewhere classified: Secondary | ICD-10-CM | POA: Diagnosis not present

## 2017-10-24 DIAGNOSIS — Z6830 Body mass index (BMI) 30.0-30.9, adult: Secondary | ICD-10-CM | POA: Diagnosis not present

## 2017-10-24 DIAGNOSIS — E7849 Other hyperlipidemia: Secondary | ICD-10-CM | POA: Diagnosis not present

## 2017-10-27 ENCOUNTER — Ambulatory Visit: Payer: Self-pay | Admitting: *Deleted

## 2017-10-28 ENCOUNTER — Other Ambulatory Visit: Payer: Self-pay | Admitting: *Deleted

## 2017-10-28 NOTE — Patient Outreach (Signed)
[facility]) Care Management  10/28/2017  Catherine Harding [birth year] [id number]   Return call from patient. This social worker was able to confirm that she has received the transportation application from [employer]. Per patient, it has been completed and returned. She has a ride scheduled for a doctor's appointment scheduled for 11/23/17. Patient verbalized having no additional community resource needs at this time. Patient to be closed to [facility] care management at this time. Patient provided with this social worker's contact information if there are additional needs in the future.   Sheralyn Boatman [facility] Care Management [phone]

## 2017-10-28 NOTE — Patient Outreach (Signed)
[facility]) Care Management  10/28/2017  GWENDLOYN FORSEE [birth year] [id number]   Phone call to patient to follow up on community resources provided related to transportation. Patient was not reached, phone rang with no available voicemail to leave message.   Plan: This Education officer, museum will attempt another call within 1 week to follow up on transportation resources.    Sheralyn Boatman [facility] Care Management [phone]

## 2017-11-23 DIAGNOSIS — H353132 Nonexudative age-related macular degeneration, bilateral, intermediate dry stage: Secondary | ICD-10-CM | POA: Diagnosis not present

## 2017-11-23 DIAGNOSIS — H35342 Macular cyst, hole, or pseudohole, left eye: Secondary | ICD-10-CM | POA: Diagnosis not present

## 2017-12-13 ENCOUNTER — Ambulatory Visit (HOSPITAL_COMMUNITY)
Admission: RE | Admit: 2017-12-13 | Discharge: 2017-12-13 | Disposition: A | Discharge status: Home / Self Care | Payer: PPO | Source: Ambulatory Visit | Attending: Endocrinology | Admitting: Endocrinology

## 2017-12-13 ENCOUNTER — Encounter (HOSPITAL_COMMUNITY): Payer: Self-pay

## 2017-12-13 DIAGNOSIS — H9392 Unspecified disorder of left ear: Secondary | ICD-10-CM | POA: Diagnosis not present

## 2017-12-13 DIAGNOSIS — M810 Age-related osteoporosis without current pathological fracture: Secondary | ICD-10-CM | POA: Diagnosis not present

## 2017-12-13 DIAGNOSIS — Z6830 Body mass index (BMI) 30.0-30.9, adult: Secondary | ICD-10-CM | POA: Diagnosis not present

## 2017-12-13 MED ORDER — DENOSUMAB 60 MG/ML ~~LOC~~ SOLN
60.0000 mg | Freq: Once | SUBCUTANEOUS | Status: AC
  Administered 2017-12-13: 60 mg via SUBCUTANEOUS
  Filled 2017-12-13: qty 1

## 2017-12-13 NOTE — Discharge Instructions (Signed)
Denosumab injection / Prolia What is this medicine? DENOSUMAB (den oh sue mab) slows bone breakdown. Prolia is used to treat osteoporosis in women after menopause and in men. Delton See is used to treat a high calcium level due to cancer and to prevent bone fractures and other bone problems caused by multiple myeloma or cancer bone metastases. Delton See is also used to treat giant cell tumor of the bone. This medicine may be used for other purposes; ask your health care provider or pharmacist if you have questions. COMMON BRAND NAME(S): Prolia, XGEVA What should I tell my health care provider before I take this medicine? They need to know if you have any of these conditions: -dental disease -having surgery or tooth extraction -infection -kidney disease -low levels of calcium or Vitamin D in the blood -malnutrition -on hemodialysis -skin conditions or sensitivity -thyroid or parathyroid disease -an unusual reaction to denosumab, other medicines, foods, dyes, or preservatives -pregnant or trying to get pregnant -breast-feeding How should I use this medicine? This medicine is for injection under the skin. It is given by a health care professional in a hospital or clinic setting. If you are getting Prolia, a special MedGuide will be given to you by the pharmacist with each prescription and refill. Be sure to read this information carefully each time. For Prolia, talk to your pediatrician regarding the use of this medicine in children. Special care may be needed. For Delton See, talk to your pediatrician regarding the use of this medicine in children. While this drug may be prescribed for children as young as 13 years for selected conditions, precautions do apply. Overdosage: If you think you have taken too much of this medicine contact a poison control center or emergency room at once. NOTE: This medicine is only for you. Do not share this medicine with others. What if I miss a dose? It is important not to  miss your dose. Call your doctor or health care professional if you are unable to keep an appointment. What may interact with this medicine? Do not take this medicine with any of the following medications: -other medicines containing denosumab This medicine may also interact with the following medications: -medicines that lower your chance of fighting infection -steroid medicines like prednisone or cortisone This list may not describe all possible interactions. Give your health care provider a list of all the medicines, herbs, non-prescription drugs, or dietary supplements you use. Also tell them if you smoke, drink alcohol, or use illegal drugs. Some items may interact with your medicine. What should I watch for while using this medicine? Visit your doctor or health care professional for regular checks on your progress. Your doctor or health care professional may order blood tests and other tests to see how you are doing. Call your doctor or health care professional for advice if you get a fever, chills or sore throat, or other symptoms of a cold or flu. Do not treat yourself. This drug may decrease your body's ability to fight infection. Try to avoid being around people who are sick. You should make sure you get enough calcium and vitamin D while you are taking this medicine, unless your doctor tells you not to. Discuss the foods you eat and the vitamins you take with your health care professional. See your dentist regularly. Brush and floss your teeth as directed. Before you have any dental work done, tell your dentist you are receiving this medicine. Do not become pregnant while taking this medicine or for 5 months  after stopping it. Talk with your doctor or health care professional about your birth control options while taking this medicine. Women should inform their doctor if they wish to become pregnant or think they might be pregnant. There is a potential for serious side effects to an unborn  child. Talk to your health care professional or pharmacist for more information. What side effects may I notice from receiving this medicine? Side effects that you should report to your doctor or health care professional as soon as possible: -allergic reactions like skin rash, itching or hives, swelling of the face, lips, or tongue -bone pain -breathing problems -dizziness -jaw pain, especially after dental work -redness, blistering, peeling of the skin -signs and symptoms of infection like fever or chills; cough; sore throat; pain or trouble passing urine -signs of low calcium like fast heartbeat, muscle cramps or muscle pain; pain, tingling, numbness in the hands or feet; seizures -unusual bleeding or bruising -unusually weak or tired Side effects that usually do not require medical attention (report to your doctor or health care professional if they continue or are bothersome): -constipation -diarrhea -headache -joint pain -loss of appetite -muscle pain -runny nose -tiredness -upset stomach This list may not describe all possible side effects. Call your doctor for medical advice about side effects. You may report side effects to FDA at 1-800-FDA-1088. Where should I keep my medicine? This medicine is only given in a clinic, doctor's office, or other health care setting and will not be stored at home. NOTE: This sheet is a summary. It may not cover all possible information. If you have questions about this medicine, talk to your doctor, pharmacist, or health care provider.  2018 Elsevier/Gold Standard (2016-10-19 19:17:21)

## 2018-02-27 DIAGNOSIS — R300 Dysuria: Secondary | ICD-10-CM | POA: Diagnosis not present

## 2018-02-27 DIAGNOSIS — J069 Acute upper respiratory infection, unspecified: Secondary | ICD-10-CM | POA: Diagnosis not present

## 2018-02-27 DIAGNOSIS — Z6830 Body mass index (BMI) 30.0-30.9, adult: Secondary | ICD-10-CM | POA: Diagnosis not present

## 2018-02-27 DIAGNOSIS — J029 Acute pharyngitis, unspecified: Secondary | ICD-10-CM | POA: Diagnosis not present

## 2018-02-27 DIAGNOSIS — R05 Cough: Secondary | ICD-10-CM | POA: Diagnosis not present

## 2018-04-23 DIAGNOSIS — J019 Acute sinusitis, unspecified: Secondary | ICD-10-CM | POA: Diagnosis not present

## 2018-04-25 DIAGNOSIS — Z6830 Body mass index (BMI) 30.0-30.9, adult: Secondary | ICD-10-CM | POA: Diagnosis not present

## 2018-04-25 DIAGNOSIS — M810 Age-related osteoporosis without current pathological fracture: Secondary | ICD-10-CM | POA: Diagnosis not present

## 2018-04-25 DIAGNOSIS — E559 Vitamin D deficiency, unspecified: Secondary | ICD-10-CM | POA: Diagnosis not present

## 2018-04-25 DIAGNOSIS — G7109 Other specified muscular dystrophies: Secondary | ICD-10-CM | POA: Diagnosis not present

## 2018-04-25 DIAGNOSIS — N200 Calculus of kidney: Secondary | ICD-10-CM | POA: Diagnosis not present

## 2018-04-25 DIAGNOSIS — E668 Other obesity: Secondary | ICD-10-CM | POA: Diagnosis not present

## 2018-04-25 DIAGNOSIS — I10 Essential (primary) hypertension: Secondary | ICD-10-CM | POA: Diagnosis not present

## 2018-04-25 DIAGNOSIS — R7301 Impaired fasting glucose: Secondary | ICD-10-CM | POA: Diagnosis not present

## 2018-04-25 DIAGNOSIS — K589 Irritable bowel syndrome without diarrhea: Secondary | ICD-10-CM | POA: Diagnosis not present

## 2018-04-25 DIAGNOSIS — R202 Paresthesia of skin: Secondary | ICD-10-CM | POA: Diagnosis not present

## 2018-04-25 DIAGNOSIS — E7849 Other hyperlipidemia: Secondary | ICD-10-CM | POA: Diagnosis not present

## 2018-04-25 DIAGNOSIS — I5189 Other ill-defined heart diseases: Secondary | ICD-10-CM | POA: Diagnosis not present

## 2018-05-04 DIAGNOSIS — K589 Irritable bowel syndrome without diarrhea: Secondary | ICD-10-CM | POA: Diagnosis not present

## 2018-05-04 DIAGNOSIS — K219 Gastro-esophageal reflux disease without esophagitis: Secondary | ICD-10-CM | POA: Diagnosis not present

## 2018-06-01 ENCOUNTER — Ambulatory Visit: Payer: PPO | Admitting: Adult Health

## 2018-06-01 DIAGNOSIS — M50322 Other cervical disc degeneration at C5-C6 level: Secondary | ICD-10-CM | POA: Diagnosis not present

## 2018-06-01 DIAGNOSIS — M9902 Segmental and somatic dysfunction of thoracic region: Secondary | ICD-10-CM | POA: Diagnosis not present

## 2018-06-01 DIAGNOSIS — M9901 Segmental and somatic dysfunction of cervical region: Secondary | ICD-10-CM | POA: Diagnosis not present

## 2018-06-01 DIAGNOSIS — M5033 Other cervical disc degeneration, cervicothoracic region: Secondary | ICD-10-CM | POA: Diagnosis not present

## 2018-06-05 DIAGNOSIS — G7100 Muscular dystrophy, unspecified: Secondary | ICD-10-CM | POA: Diagnosis not present

## 2018-06-05 DIAGNOSIS — M25511 Pain in right shoulder: Secondary | ICD-10-CM | POA: Diagnosis not present

## 2018-06-09 DIAGNOSIS — Z1231 Encounter for screening mammogram for malignant neoplasm of breast: Secondary | ICD-10-CM | POA: Diagnosis not present

## 2018-06-13 NOTE — Progress Notes (Signed)
[facility]  [name] DOB: [birth year]   REASON FOR VISIT: Follow-up for limb-girdle muscular dystrophy, scoliosis HISTORY FROM: Patient   HISTORY OF PRESENT ILLNESS:UPDATE 9/4/2019CM Catherine Harding, 84 year old female returns for follow-up with a history of gait disorder, limb girdle muscular dystrophy.  She also has a history of secondary scoliosis which is led to stiffness in her lower extremities problems with arising from a chair or from a seated position.  She reports that her gait has gotten worse since she was last seen last year.  She feels weaker.  She denies any falls.  She ambulates with 2 single-point canes.  She slipped on a  stool and has a rotator cuff tear on the right and is getting injections at [place].  She continues to drive.  She continues to live alone.  She has a long-term care policy and is thinking about getting care in the home with that.  Followup for gait abnormality.CD Interval history from 06/01/2017, I have the pleasure of seeing Catherine Harding N/A scheduled yearly revisit,the patient states that her gait has not further deteriorated, she is using a seated walker, she does have abdominal and flank pain and was thought to suffer from diverticulitis, but his CT of the abdomen has not confirmed the presence of inflammatory changes. She now contracted a urinary tract infection, she has had a difficult summer due to abnormal digestion and infection. She is seeing dr Paulita Fujita GI @ [facility] today.  The patient had developed hip pain in the year 2014 and had multiple falls that year, but steroid injections into the hip has helped greatly and she had no recurrence since. Her last fall was last November.  She now uses the walker regularly outside of the home, in home she " wobbles ' from wall to furniture. She has had dizzyspells. Improved after d/c Hyoscyamines.   Catherine Harding,  a 84 year old female returns for follow-up on  her limb girdle dystrophy and associated gait disorder. She was last seen by me on 07/05/2013. 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 The scoliosis has led to some stiffness in her lower extremities problems to arise from a chair or from a seated position. The stiffness is worse if she remained seated for a longer period of time. She reports that just driving to town for 30 or 40 minutes would be enough to have problems. She is however still driving, her problem is to enter and leave the car. In 2007 this established patient underwent a EMG and nerve conduction study, which showed only  a mild peripheral neuropathy. Her neurologic symptoms are otherwise unchanged- she has the expected progression of gait difficulties,  she does have a loss of balance /poor sense of balance and she is walking with a walker to stabilize. She reports using a cane at home. Within the boundaries of her home she can maneuver by holding onto the wall or furniture if needed. She does not use an assistive device per se indoors. She reports sciatic nerve pain. Hip pain- treated with meloxicam and prednisone  But only found relief after sterid injections.    she has had 3 falls in the 2014 , but none in 2015- and all occurred when she was not using her cane outdoors.  REVIEW OF SYSTEMS: Full 14 system review of systems performed and notable only for those listed, all others are neg:  Constitutional: neg  Cardiovascular: neg Ear/Nose/Throat: neg  Skin: neg Eyes: neg Respiratory: neg Gastroitestinal: neg  Hematology/Lymphatic: neg  Endocrine: neg Musculoskeletal: Walking difficulty, right shoulder pain due to rotator tear Allergy/Immunology: neg Neurological: neg Psychiatric: neg Sleep : neg   ALLERGIES: Allergies  Allergen Reactions  . Anesthetics, Amide   . Celebrex [Celecoxib]   . Ciprofloxacin Swelling  . Codeine Nausea And Vomiting  . Morphine And Related Nausea  And Vomiting  . Sulfa Antibiotics     HOME MEDICATIONS: Outpatient Medications Prior to Visit  Medication Sig Dispense Refill  . Artificial Tear Solution (SYSTANE CONTACTS) SOLN Apply to eye.    . bacitracin ophthalmic ointment Place 007 application into both eyes 3 (three) times daily.    . Calcium Carbonate Antacid (TUMS PO) Take by mouth daily as needed. chewable    . Calcium Carbonate-Vit D-Min (CALTRATE PLUS PO) Take by mouth.      . denosumab (PROLIA) 60 MG/ML SOLN injection Inject 60 mg into the skin every 6 (six) months. Administer in upper arm, thigh, or abdomen    . diltiazem (CARDIZEM CD) 240 MG 24 hr capsule Take 240 mg by mouth daily.      . ergocalciferol (VITAMIN D2) 50000 UNITS capsule Take 50,000 Units by mouth 2 (two) times a week.     Marland Kitchen FLUTICASONE PROPIONATE, NASAL, NA Place into the nose. 2 sprays 1/daily    . furosemide (LASIX) 20 MG tablet Take 20 mg by mouth daily.     . lansoprazole (PREVACID) 30 MG capsule Take 30 mg by mouth daily.    Marland Kitchen loperamide (IMODIUM) 2 MG capsule Take 2 mg by mouth as needed for diarrhea or loose stools.    Marland Kitchen losartan-hydrochlorothiazide (HYZAAR) 100-12.5 MG per tablet Take 12.5 tablets by mouth daily.    . Multiple Vitamins-Minerals (PRESERVISION/LUTEIN) CAPS Take 2 capsules by mouth daily.    . Omega-3 Fatty Acids (FISH OIL) 1200 MG CAPS Take 2 capsules by mouth daily.    Vladimir Faster Glycol-Propyl Glycol 0.4-0.3 % SOLN Apply to eye.      . Probiotic Product (ALIGN PO) Take by mouth.      . sodium chloride (AYR) 0.65 % nasal spray Place 1 spray into the nose as needed for congestion.    . Triamcinolone Acetonide (NASACORT AQ NA) Place into the nose. As needed     No facility-administered medications prior to visit.     PAST MEDICAL HISTORY: Past Medical History:  Diagnosis Date  . Arthritis   . Atrophic vaginitis   . Basal cell carcinoma   . Cervical dysplasia   . Complication of anesthesia    pseudocholinesterase  deficiency-hard to wake up  . Falls   . GERD (gastroesophageal reflux disease)   . Hypertension   . IBS (irritable bowel syndrome)   . Jaw atrophy   . Limb-girdle muscular dystrophy ([place]) 07/05/2013  . Macular hole of left eye   . Muscular dystrophy ([place])   . Osteoporosis    pelvic fracture  . Pseudocholinesterase deficiency   . Scoliosis     PAST SURGICAL HISTORY: Past Surgical History:  Procedure Laterality Date  . ABDOMINAL HYSTERECTOMY     TAH BSO  . BREAST LUMPECTOMY  1971   benign  . CESAREAN SECTION     x2  . COLPOSCOPY    . EYE SURGERY  2003   left  . TOTAL ABDOMINAL HYSTERECTOMY W/ BILATERAL SALPINGOOPHORECTOMY  1981  . TRIGGER FINGER RELEASE  07/28/2012   Procedure: RELEASE TRIGGER FINGER/A-1 PULLEY;  Surgeon: Youlanda Mighty  Luisa Dago., MD;  Location: [facility];  Service: Orthopedics;  Laterality: Right;  EXCISION CYST RIGHT LONG A-1 RELEASE A-1 RIGHT LONG     FAMILY HISTORY: Family History  Problem Relation Age of Onset  . Hypertension Sister     SOCIAL HISTORY: Social History   Socioeconomic History  . Marital status: Divorced    Spouse name: Not on file  . Number of children: 3  . Years of education: 70  . Highest education level: Not on file  Occupational History  . Occupation: retired    Comment: Theatre stage manager at [employer]  . Financial resource strain: Not on file  . Food insecurity:    Worry: Not on file    Inability: Not on file  . Transportation needs:    Medical: Not on file    Non-medical: Not on file  Tobacco Use  . Smoking status: Never Smoker  . Smokeless tobacco: Never Used  Substance and Sexual Activity  . Alcohol use: No    Alcohol/week: 0.0 standard drinks    Comment: rare  . Drug use: No  . Sexual activity: Never    Birth control/protection: Surgical  Lifestyle  . Physical activity:    Days per week: Not on file    Minutes per session: Not on file  . Stress: Not on file  Relationships  .  Social connections:    Talks on phone: Not on file    Gets together: Not on file    Attends religious service: Not on file    Active member of club or organization: Not on file    Attends meetings of clubs or organizations: Not on file    Relationship status: Not on file  . Intimate partner violence:    Fear of current or ex partner: Not on file    Emotionally abused: Not on file    Physically abused: Not on file    Forced sexual activity: Not on file  Other Topics Concern  . Not on file  Social History Narrative   Patient lives at home alone and she is divorced. Patient is retired.   Right handed.   Caffeine- one cup daily.   Uses walker.   Grown children, live 7 miles away.       PHYSICAL EXAM  Vitals:   06/14/18 1356  BP: 122/73  Pulse: 86  Weight: 161 lb (73 kg)  Height: 5\' 1"  (1.549 m)   Body mass index is 30.42 kg/m.  Generalized: Well developed, in no acute distress , well-groomed Head: normocephalic and atraumatic,. Oropharynx benign  Neck: Supple,  Musculoskeletal: Scoliosis spinal stenosis Skin 1+ edema of left left lower extremity Neurological examination   Mentation: Alert oriented to time, place, history taking. Attention span and concentration appropriate. Recent and remote memory intact.  Follows all commands speech and language fluent.   Cranial nerve II-XII: Pupils were equal round reactive to light extraocular movements were full, visual field were full on confrontational test. Facial sensation and strength were normal. hearing was intact to finger rubbing bilaterally. Uvula tongue midline. head turning and shoulder shrug were normal and symmetric.Tongue protrusion into cheek strength was normal. Motor: Weak hip flexors and adductors Sensory:  light touch, pinprick, and  Vibration, diminished in feet Coordination: finger-nose-finger, , no dysmetria Reflexes: Brisk in the upper and lower extremities, plantar responses were flexor bilaterally. Gait and  Station: Rising up from seated position with push off patient ambulates with 2 single-point canes penguin  gait out  DIAGNOSTIC DATA (LABS, IMAGING, TESTING) - I reviewed patient records, labs, notes, testing and imaging myself where available.  Lab Results  Component Value Date   HGB 14.0 07/28/2012      Component Value Date/Time   NA 137 08/28/2012 1249   K 3.6 08/28/2012 1249   CL 102 08/28/2012 1249   CO2 27 08/28/2012 1249   GLUCOSE 115 (H) 08/28/2012 1249   BUN 21 08/28/2012 1249   CREATININE 0.76 08/28/2012 1249   CALCIUM 10.1 08/28/2012 1249   CALCIUM 10.1 08/28/2012 1249   PROT 6.8 08/28/2012 1249   ALBUMIN 4.3 08/28/2012 1249   AST 34 08/28/2012 1249   ALT 35 08/28/2012 1249   ALKPHOS 41 08/28/2012 1249   BILITOT 0.4 08/28/2012 1249   GFRNONAA 82 (L) 07/27/2012 1100   GFRAA >90 07/27/2012 1100     ASSESSMENT AND PLAN  84 y.o. year old female  has a past medical history of Arthritis, suspected LGM the tight muscular dystrophy, scoliosis and osteoporosis.  Long history of gait disorder  For increased gait abnormality will order PT eval and treat Continue f/u with ortho for shoulder F/U in 1 year and prn Dennie Bible, [facility], [facility], APRN  [facility] [address], [place], [place] [address] [phone]

## 2018-06-14 ENCOUNTER — Encounter: Payer: Self-pay | Admitting: Nurse Practitioner

## 2018-06-14 ENCOUNTER — Ambulatory Visit (INDEPENDENT_AMBULATORY_CARE_PROVIDER_SITE_OTHER): Payer: PPO | Admitting: Nurse Practitioner

## 2018-06-14 VITALS — BP 122/73 | HR 86 | Ht 61.0 in | Wt 161.0 lb

## 2018-06-14 DIAGNOSIS — M4120 Other idiopathic scoliosis, site unspecified: Secondary | ICD-10-CM | POA: Diagnosis not present

## 2018-06-14 DIAGNOSIS — R269 Unspecified abnormalities of gait and mobility: Secondary | ICD-10-CM

## 2018-06-14 DIAGNOSIS — Z9181 History of falling: Secondary | ICD-10-CM

## 2018-06-14 DIAGNOSIS — G7109 Other specified muscular dystrophies: Secondary | ICD-10-CM | POA: Diagnosis not present

## 2018-06-14 DIAGNOSIS — G71039 Limb girdle muscular dystrophy, unspecified: Secondary | ICD-10-CM

## 2018-06-14 DIAGNOSIS — G7100 Muscular dystrophy, unspecified: Secondary | ICD-10-CM

## 2018-06-14 NOTE — Progress Notes (Signed)
I agree with the assessment and plan as directed by NP .The patient is known to me .   Antionetta Ator, MD  

## 2018-06-14 NOTE — Patient Instructions (Signed)
For increased gait abnormality will order PT Continue f/u with ortho for shoulder F/U in 1 year and prn

## 2018-07-04 ENCOUNTER — Ambulatory Visit (HOSPITAL_COMMUNITY): Payer: PPO | Attending: Endocrinology

## 2018-07-05 DIAGNOSIS — G7100 Muscular dystrophy, unspecified: Secondary | ICD-10-CM | POA: Diagnosis not present

## 2018-07-05 DIAGNOSIS — M25511 Pain in right shoulder: Secondary | ICD-10-CM | POA: Diagnosis not present

## 2018-07-17 ENCOUNTER — Ambulatory Visit: Payer: PPO | Admitting: Physical Therapy

## 2018-07-18 ENCOUNTER — Encounter: Payer: Self-pay | Admitting: Physical Therapy

## 2018-07-18 ENCOUNTER — Ambulatory Visit: Payer: PPO | Attending: Endocrinology | Admitting: Physical Therapy

## 2018-07-18 ENCOUNTER — Other Ambulatory Visit: Payer: Self-pay

## 2018-07-18 DIAGNOSIS — M6281 Muscle weakness (generalized): Secondary | ICD-10-CM | POA: Diagnosis not present

## 2018-07-18 DIAGNOSIS — R2681 Unsteadiness on feet: Secondary | ICD-10-CM | POA: Diagnosis not present

## 2018-07-18 DIAGNOSIS — R2689 Other abnormalities of gait and mobility: Secondary | ICD-10-CM | POA: Diagnosis not present

## 2018-07-18 NOTE — Therapy (Signed)
Catherine Harding [address] [place], [place], [address] Phone: [phone]   Fax:  [phone]  Physical Therapy Evaluation  Patient Details  Name: Catherine Harding MRN: [id number] Date of Birth: 02/25/1930 Referring Provider (PT): Dennie Bible, NP   Encounter Date: 07/18/2018  PT End of Session - 07/18/18 1308    Visit Number  1    Number of Visits  17    Date for PT Re-Evaluation  09/12/18    Authorization Type  HTA    PT Start Time  1149    PT Stop Time  1230    PT Time Calculation (min)  41 min    Activity Tolerance  Patient tolerated treatment well    Behavior During Therapy  [facility] for tasks assessed/performed       Past Medical History:  Diagnosis Date  . Arthritis   . Atrophic vaginitis   . Basal cell carcinoma   . Cervical dysplasia   . Complication of anesthesia    pseudocholinesterase deficiency-hard to wake up  . Falls   . GERD (gastroesophageal reflux disease)   . Hypertension   . IBS (irritable bowel syndrome)   . Jaw atrophy   . 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 Macular hole of left eye   . Muscular dystrophy ([place])   . Osteoporosis    pelvic fracture  . Pseudocholinesterase deficiency   . Scoliosis     Past Surgical History:  Procedure Laterality Date  . ABDOMINAL HYSTERECTOMY     TAH BSO  . BREAST LUMPECTOMY  1971   benign  . CESAREAN SECTION     x2  . COLPOSCOPY    . EYE SURGERY  2003   left  . TOTAL ABDOMINAL HYSTERECTOMY W/ BILATERAL SALPINGOOPHORECTOMY  1981  . TRIGGER FINGER RELEASE  07/28/2012   Procedure: RELEASE TRIGGER FINGER/A-1 PULLEY;  Surgeon: Cammie Sickle., MD;  Location: [place];  Service: Orthopedics;  Laterality: Right;  EXCISION CYST RIGHT LONG A-1 RELEASE A-1 RIGHT LONG     There were no vitals filed for this visit.   Subjective Assessment - 07/18/18 1155    Subjective  Patient reporting diagnosis of  limb-girdle MD. Feels that over the last 3 months B LE has gotten weaker. Lives alone. Has difficulty with community ambulation. Wants Lofstrands crutches. Was doing Tai Chi - had to quit due to life circumstances. Currently using rollator - has one in car and one at home. Reports she walks at home barefooted and furntiure/wall walks. 2 months ago slipped on step stoll with resultant RTC tear.     Pertinent History  Limb-girdle MD, HTN, obesity, osteopenia    Patient Stated Goals  "I want to stay out of the wheelchair."    Currently in Pain?  No/denies    Multiple Pain Sites  No         [facility] PT Assessment - 07/18/18 1201      Assessment   Medical Diagnosis  Limb-girdle MD; abnormality of gait; risk for falls    Referring Provider (PT)  Dennie Bible, NP    Onset Date/Surgical Date  --   ~3 months increase in weakness   Next MD Visit  --   every 6 months   Prior Therapy  no      Precautions   Precautions  Fall      Restrictions   Weight Bearing Restrictions  No      Balance Screen  Has the patient fallen in the past 6 months  Yes    How many times?  1    Has the patient had a decrease in activity level because of a fear of falling?   No    Is the patient reluctant to leave their home because of a fear of falling?   No      Home Environment   Living Environment  Private residence    Living Arrangements  Alone    Type of [place] to enter    Entrance Stairs-Number of Steps  2    [place]  One level    [place] - 4 wheels;Cane - single point;Grab bars - tub/shower;Bedside commode      Prior Function   Level of Independence  Independent    Vocation  Retired    Leisure  drives      Cognition   Overall Cognitive Status  Within Functional Limits for tasks assessed      Sensation   Light Touch  Appears Intact      Functional Tests   Functional tests  --      Posture/Postural Control    Posture/Postural Control  Postural limitations    Postural Limitations  Rounded Shoulders;Forward head      ROM / Strength   AROM / PROM / Strength  Strength      Strength   Strength Assessment Site  Hip;Knee;Ankle    Right/Left Hip  Right;Left    Right Hip Flexion  4-/5    Left Hip Flexion  3+/5    Right/Left Knee  Right;Left    Right Knee Flexion  4+/5    Right Knee Extension  4+/5    Left Knee Flexion  4+/5    Left Knee Extension  4+/5    Right/Left Ankle  Right;Left    Right Ankle Dorsiflexion  4/5    Left Ankle Dorsiflexion  4/5      Transfers   Transfers  Sit to Stand;Stand to Sit    Sit to Stand  5: Supervision    Sit to Stand Details  Visual cues/gestures for precautions/safety;Visual cues for safe use of DME/AE    Sit to Stand Details (indicate cue type and reason)  does not lock rollator     Five time sit to stand comments   19.50    Stand to Sit  5: Supervision    Stand to Sit Details (indicate cue type and reason)  Visual cues for safe use of DME/AE;Visual cues/gestures for precautions/safety      Ambulation/Gait   Ambulation/Gait  Yes    Ambulation/Gait Assistance  5: Supervision;6: Modified independent (Device/Increase time)    Ambulation/Gait Assistance Details  supervision for general safety    Ambulation Distance (Feet)  100 Feet    Assistive device  Rollator    Gait Pattern  Step-through pattern;Decreased stride length;Decreased hip/knee flexion - right;Decreased hip/knee flexion - left;Trunk flexed    Ambulation Surface  Level;Indoor    Gait velocity  2.55 ft/sec      Balance   Balance Assessed  Yes      Standardized Balance Assessment   Standardized Balance Assessment  Berg Balance Test;Timed Up and Go Test;Five Times Sit to Stand    Five times sit to stand comments   19.50      Berg Balance Test   Sit to Stand  Able  to stand  independently using hands    Standing Unsupported  Able to stand safely 2 minutes    Sitting with Back Unsupported but Feet  Supported on Floor or Stool  Able to sit safely and securely 2 minutes    Stand to Sit  Controls descent by using hands    Transfers  Able to transfer safely, definite need of hands    Standing Unsupported with Eyes Closed  Able to stand 10 seconds with supervision    Standing Ubsupported with Feet Together  Needs help to attain position but able to stand for 30 seconds with feet together    From Standing, Reach Forward with Outstretched Arm  Reaches forward but needs supervision    From Standing Position, Pick up Object from Floor  Unable to pick up shoe, but reaches 2-5 cm (1-2") from shoe and balances independently    From Standing Position, Turn to Look Behind Over each Shoulder  Turn sideways only but maintains balance    Turn 360 Degrees  Needs close supervision or verbal cueing    Standing Unsupported, Alternately Place Feet on Step/Stool  Able to complete >2 steps/needs minimal assist    Standing Unsupported, One Foot in Front  Needs help to step but can hold 15 seconds    Standing on One Leg  Tries to lift leg/unable to hold 3 seconds but remains standing independently    Total Score  30    Berg comment:  30/56 - significant fall risk      Timed Up and Go Test   TUG  Normal TUG    Normal TUG (seconds)  24.59   use of arm rests and rollator               Objective measurements completed on examination: See above findings.              PT Education - 07/18/18 1308    Education Details  exam findings, fall risk, POC, justification for skilled treatment    Person(s) Educated  Patient    Methods  Explanation    Comprehension  Verbalized understanding       PT Short Term Goals - 07/18/18 1540      PT SHORT TERM GOAL #1   Title  patient to be independent with initial HEP     Time  4    Period  Weeks    Status  New    Target Date  08/15/18      PT SHORT TERM GOAL #2   Title  patient to improve BERG by >/= 4 points demonstrating improved balance    Time   4    Period  Weeks    Status  New    Target Date  08/15/18      PT SHORT TERM GOAL #3   Title  patient to improve TUG by >/= 3 seconds    Time  4    Period  Weeks    Status  New    Target Date  08/15/18      PT SHORT TERM GOAL #4   Title  patient to demonstrate gait of >/= 400' with LRAD over level indoor/outdoor surfaces including curbs/inclines with appropriate safety awareness and without LOB    Time  4    Period  Weeks    Status  New    Target Date  08/15/18        PT Long Term Goals - 07/18/18 1542  PT LONG TERM GOAL #1   Title  patient to be independent with advanced HEP    Time  8    Period  Weeks    Status  New    Target Date  09/12/18      PT LONG TERM GOAL #2   Title  patient to improve gait speed to >/= 3.32f/sec wtih LRAD demonstrating improved functional mobility    Time  8    Period  Weeks    Status  New    Target Date  09/12/18      PT LONG TERM GOAL #3   Title  patient to improve BERG by >/= 8 points demonstrating reduced fall risk    Time  8    Period  Weeks    Status  New    Target Date  09/12/18      PT LONG TERM GOAL #4   Title  patient to demonstrate gait of >/=800' over various indoor and outdoor surfaces without LOB or overt instability with LRAD    Time  8    Period  Weeks    Status  New    Target Date  09/12/18      PT LONG TERM GOAL #5   Title  patient to improve TUG by >/= 7 seconds    Time  8    Period  Weeks    Status  New    Target Date  09/12/18             Plan - 07/18/18 1309    Clinical Impression Statement  Ms. ZOetkenis a very pleasant 84y/o female [facility] regarding primary complaints of LE weakness and fear of falling. Patient with a PMH significant for limb-girdle muscular dystrophy, HTN, and osteopenia. Ambulating today with rollator, but interested in gait training with 2 SPC vs Lofstrand crutches as taking rollator into community is difficult for patient. Patient today scoring in high  fall risk for BERG, TUG, 5x sit to stand as well as demonstrating reduced gait speed of that of a safe community ambulator - see values above. Patient educated on fall risk and jsutification for skilled treatment to improve safety and independence. Patient to benefit from skilled PT today to address gait, balance, strength, DME, and general functional mobility to reduce fall risk.     History and Personal Factors relevant to plan of care:  Limb-girdle MD, HTN, obesity, osteopenia    Clinical Presentation  Evolving    Clinical Presentation due to:  Limb-girdle MD, HTN, obesity, osteopenia, lives alone, reduced support, intermittently requires assist for community navigation/driving    Clinical Decision Making  Moderate    Rehab Potential  Good    PT Frequency  2x / week    PT Duration  8 weeks    PT Treatment/Interventions  ADLs/Self Care Home Management;Therapeutic exercise;Therapeutic activities;Functional mobility training;Stair training;Gait training;DME Instruction;Balance training;Neuromuscular re-education;Patient/family education;Manual techniques;Orthotic Fit/Training;Taping    PT Next Visit Plan  initiate HEP for gentle strengthening and balance    Consulted and Agree with Plan of Care  Patient       Patient will benefit from skilled therapeutic intervention in order to improve the following deficits and impairments:  Abnormal gait, Decreased activity tolerance, Decreased balance, Decreased safety awareness, Decreased mobility, Decreased strength, Difficulty walking  Visit Diagnosis: Unsteadiness on feet  Other abnormalities of gait and mobility  Muscle weakness (generalized)     Problem List Patient Active Problem List   Diagnosis  Date Noted  . Abnormality of gait 12/02/2015  . Risk for falls 12/02/2015  . Sciatica neuralgia 06/10/2015  . Scoliosis (and kyphoscoliosis), idiopathic 06/10/2015  . 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 Cervical dysplasia   . IBS  (irritable bowel syndrome)   . Macular hole of left eye   . Hypertension   . Pseudocholinesterase deficiency   . Muscular dystrophy ([place])   . CIN (conjunctival intraepithelial neoplasia)   . Osteoporosis   . Atrophic vaginitis      Lanney Gins, PT, DPT Supplemental Physical Therapist 07/18/18 3:47 PM Pager: [phone] Office: [place] [address] [place], [place], [address] Phone: [phone]   Fax:  [phone]  Name: SHANETTE TAMARGO MRN: [id number] Date of Birth: 03/21/1930

## 2018-07-27 DIAGNOSIS — L218 Other seborrheic dermatitis: Secondary | ICD-10-CM | POA: Diagnosis not present

## 2018-07-27 DIAGNOSIS — D2371 Other benign neoplasm of skin of right lower limb, including hip: Secondary | ICD-10-CM | POA: Diagnosis not present

## 2018-07-27 DIAGNOSIS — D2262 Melanocytic nevi of left upper limb, including shoulder: Secondary | ICD-10-CM | POA: Diagnosis not present

## 2018-07-27 DIAGNOSIS — L820 Inflamed seborrheic keratosis: Secondary | ICD-10-CM | POA: Diagnosis not present

## 2018-07-27 DIAGNOSIS — D225 Melanocytic nevi of trunk: Secondary | ICD-10-CM | POA: Diagnosis not present

## 2018-07-27 DIAGNOSIS — L821 Other seborrheic keratosis: Secondary | ICD-10-CM | POA: Diagnosis not present

## 2018-07-27 DIAGNOSIS — C44311 Basal cell carcinoma of skin of nose: Secondary | ICD-10-CM | POA: Diagnosis not present

## 2018-07-27 DIAGNOSIS — L570 Actinic keratosis: Secondary | ICD-10-CM | POA: Diagnosis not present

## 2018-07-27 DIAGNOSIS — D2261 Melanocytic nevi of right upper limb, including shoulder: Secondary | ICD-10-CM | POA: Diagnosis not present

## 2018-07-27 DIAGNOSIS — Z85828 Personal history of other malignant neoplasm of skin: Secondary | ICD-10-CM | POA: Diagnosis not present

## 2018-07-27 DIAGNOSIS — C44319 Basal cell carcinoma of skin of other parts of face: Secondary | ICD-10-CM | POA: Diagnosis not present

## 2018-07-31 ENCOUNTER — Ambulatory Visit: Payer: PPO | Admitting: Physical Therapy

## 2018-07-31 ENCOUNTER — Encounter: Payer: Self-pay | Admitting: Physical Therapy

## 2018-07-31 DIAGNOSIS — R2681 Unsteadiness on feet: Secondary | ICD-10-CM | POA: Diagnosis not present

## 2018-07-31 DIAGNOSIS — R2689 Other abnormalities of gait and mobility: Secondary | ICD-10-CM

## 2018-07-31 DIAGNOSIS — M6281 Muscle weakness (generalized): Secondary | ICD-10-CM

## 2018-07-31 NOTE — Patient Instructions (Signed)
Access Code: [email or web address]  URL: [email or web address]  Date: 07/31/2018  Prepared by: Bjorn Loser   Exercises  Romberg Stance with Head Nods and head turns - 5 reps - 2 sets - 1-2x daily - 5x weekly  Standing Thoracic and Cervical Rotation standing at the sink - 5 reps - 2 sets - 1-2x daily - 5x weekly  Side Stepping back and forth with Counter Support - 5 reps - 2 sets - 1x daily - 5x weekly  March in Place - 5 reps - 2 sets - 1x daily - 5x weekly

## 2018-07-31 NOTE — Therapy (Signed)
[facility] [address] [place], [place], [address] Phone: [phone]   Fax:  [phone]  Physical Therapy Treatment  Patient Details  Name: Catherine Harding MRN: [id number] Date of Birth: Aug 02, 1930 Referring Provider (PT): Dennie Bible, NP   Encounter Date: 07/31/2018  PT End of Session - 07/31/18 1243    Visit Number  2    Number of Visits  17    Date for PT Re-Evaluation  09/12/18    Authorization Type  HTA    PT Start Time  1154    PT Stop Time  1232    PT Time Calculation (min)  38 min    Activity Tolerance  Patient tolerated treatment well    Behavior During Therapy  [facility] for tasks assessed/performed       Past Medical History:  Diagnosis Date  . Arthritis   . Atrophic vaginitis   . Basal cell carcinoma   . Cervical dysplasia   . Complication of anesthesia    pseudocholinesterase deficiency-hard to wake up  . Falls   . GERD (gastroesophageal reflux disease)   . Hypertension   . IBS (irritable bowel syndrome)   . Jaw atrophy   . 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 Macular hole of left eye   . Muscular dystrophy ([place])   . Osteoporosis    pelvic fracture  . Pseudocholinesterase deficiency   . Scoliosis     Past Surgical History:  Procedure Laterality Date  . ABDOMINAL HYSTERECTOMY     TAH BSO  . BREAST LUMPECTOMY  1971   benign  . CESAREAN SECTION     x2  . COLPOSCOPY    . EYE SURGERY  2003   left  . TOTAL ABDOMINAL HYSTERECTOMY W/ BILATERAL SALPINGOOPHORECTOMY  1981  . TRIGGER FINGER RELEASE  07/28/2012   Procedure: RELEASE TRIGGER FINGER/A-1 PULLEY;  Surgeon: Cammie Sickle., MD;  Location: [place];  Service: Orthopedics;  Laterality: Right;  EXCISION CYST RIGHT LONG A-1 RELEASE A-1 RIGHT LONG     There were no vitals filed for this visit.  Subjective Assessment - 07/31/18 1156    Subjective  Pt currently walks and does housework for  exercise.    Pertinent History  Limb-girdle MD, HTN, obesity, osteopenia    Patient Stated Goals  "I want to stay out of the wheelchair."    Currently in Pain?  No/denies                       [facility] Adult PT Treatment/Exercise - 07/31/18 0001      Transfers   Transfers  Sit to Stand;Stand to Sit    Sit to Stand  5: Supervision    Stand to Sit  5: Supervision      Ambulation/Gait   Ambulation/Gait  Yes    Ambulation/Gait Assistance  5: Supervision    Ambulation/Gait Assistance Details  supervision for general safety    Ambulation Distance (Feet)  50 Feet   x2   Assistive device  Rollator    Gait Pattern  Step-through pattern;Decreased stride length;Decreased hip/knee flexion - right;Decreased hip/knee flexion - left;Trunk flexed    Ambulation Surface  Level;Indoor          Balance Exercises - 07/31/18 1258      Balance Exercises: Standing   Standing Eyes Opened  Wide (BOA);Narrow base of support (BOS);Head turns   + nods + upper trunk rotations (standing at sink)  with 1 UE support   Sidestepping  5 reps   side stepping from side to side progressively decreasing UE support   Marching Limitations  marching in place with 2 UE support; cues for technique        PT Education - 07/31/18 1258    Education Details  HEP for balance and LE strengthening.    Person(s) Educated  Patient    Methods  Explanation;Verbal cues;Handout    Comprehension  Verbalized understanding;Returned demonstration;Verbal cues required;Need further instruction       PT Short Term Goals - 07/18/18 1540      PT SHORT TERM GOAL #1   Title  patient to be independent with initial HEP     Time  4    Period  Weeks    Status  New    Target Date  08/15/18      PT SHORT TERM GOAL #2   Title  patient to improve BERG by >/= 4 points demonstrating improved balance    Time  4    Period  Weeks    Status  New    Target Date  08/15/18      PT SHORT TERM GOAL #3   Title  patient to  improve TUG by >/= 3 seconds    Time  4    Period  Weeks    Status  New    Target Date  08/15/18      PT SHORT TERM GOAL #4   Title  patient to demonstrate gait of >/= 400' with LRAD over level indoor/outdoor surfaces including curbs/inclines with appropriate safety awareness and without LOB    Time  4    Period  Weeks    Status  New    Target Date  08/15/18        PT Long Term Goals - 07/18/18 1542      PT LONG TERM GOAL #1   Title  patient to be independent with advanced HEP    Time  8    Period  Weeks    Status  New    Target Date  09/12/18      PT LONG TERM GOAL #2   Title  patient to improve gait speed to >/= 3.70ft/sec wtih LRAD demonstrating improved functional mobility    Time  8    Period  Weeks    Status  New    Target Date  09/12/18      PT LONG TERM GOAL #3   Title  patient to improve BERG by >/= 8 points demonstrating reduced fall risk    Time  8    Period  Weeks    Status  New    Target Date  09/12/18      PT LONG TERM GOAL #4   Title  patient to demonstrate gait of >/=800' over various indoor and outdoor surfaces without LOB or overt instability with LRAD    Time  8    Period  Weeks    Status  New    Target Date  09/12/18      PT LONG TERM GOAL #5   Title  patient to improve TUG by >/= 7 seconds    Time  8    Period  Weeks    Status  New    Target Date  09/12/18            Plan - 07/31/18 1302    Clinical Impression Statement  Initiated HEP for  light strengthening and standing balance exercises.  Pt required supervision with cues for technique and 1-2 UE support (stood at sink with chair behind pt)  for standing/weight shifting activities.  Discussed with pt POC and reason for exercises in HEP; pt verbalized understanding.    Rehab Potential  Good    PT Frequency  2x / week    PT Duration  8 weeks    PT Treatment/Interventions  ADLs/Self Care Home Management;Therapeutic exercise;Therapeutic activities;Functional mobility training;Stair  training;Gait training;DME Instruction;Balance training;Neuromuscular re-education;Patient/family education;Manual techniques;Orthotic Fit/Training;Taping    PT Next Visit Plan  Review HEP for gentle strengthening and balance; gait - trial forearm crutches.    Consulted and Agree with Plan of Care  Patient       Patient will benefit from skilled therapeutic intervention in order to improve the following deficits and impairments:  Abnormal gait, Decreased activity tolerance, Decreased balance, Decreased safety awareness, Decreased mobility, Decreased strength, Difficulty walking  Visit Diagnosis: Unsteadiness on feet  Other abnormalities of gait and mobility  Muscle weakness (generalized)     Problem List Patient Active Problem List   Diagnosis Date Noted  . Abnormality of gait 12/02/2015  . Risk for falls 12/02/2015  . Sciatica neuralgia 06/10/2015  . Scoliosis (and kyphoscoliosis), idiopathic 06/10/2015  . 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 Cervical dysplasia   . IBS (irritable bowel syndrome)   . Macular hole of left eye   . Hypertension   . Pseudocholinesterase deficiency   . Muscular dystrophy ([place])   . CIN (conjunctival intraepithelial neoplasia)   . Osteoporosis   . Atrophic vaginitis    Bjorn Loser, PTA  07/31/18, 1:07 PM [facility] [address] [place], [place], [address] Phone: [phone]   Fax:  [phone]  Name: Catherine Harding MRN: [id number] Date of Birth: [birth year]

## 2018-08-02 ENCOUNTER — Ambulatory Visit: Payer: PPO | Admitting: Physical Therapy

## 2018-08-02 ENCOUNTER — Encounter: Payer: Self-pay | Admitting: Physical Therapy

## 2018-08-02 DIAGNOSIS — R2681 Unsteadiness on feet: Secondary | ICD-10-CM

## 2018-08-02 DIAGNOSIS — R2689 Other abnormalities of gait and mobility: Secondary | ICD-10-CM

## 2018-08-02 DIAGNOSIS — M6281 Muscle weakness (generalized): Secondary | ICD-10-CM

## 2018-08-02 NOTE — Therapy (Signed)
[facility] [address] [place], [place], [address] Phone: [phone]   Fax:  [phone]  Physical Therapy Treatment  Patient Details  Name: Catherine Harding MRN: [id number] Date of Birth: [birth year] Referring Provider (PT): Dennie Bible, NP   Encounter Date: 08/02/2018  PT End of Session - 08/02/18 0937    Visit Number  3    Number of Visits  17    Date for PT Re-Evaluation  09/12/18    Authorization Type  HTA    PT Start Time  0935    PT Stop Time  1015    PT Time Calculation (min)  40 min    Activity Tolerance  Patient tolerated treatment well    Behavior During Therapy  [facility] for tasks assessed/performed       Past Medical History:  Diagnosis Date  . Arthritis   . Atrophic vaginitis   . Basal cell carcinoma   . Cervical dysplasia   . Complication of anesthesia    pseudocholinesterase deficiency-hard to wake up  . Falls   . GERD (gastroesophageal reflux disease)   . Hypertension   . IBS (irritable bowel syndrome)   . Jaw atrophy   . 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 Macular hole of left eye   . Muscular dystrophy ([place])   . Osteoporosis    pelvic fracture  . Pseudocholinesterase deficiency   . Scoliosis     Past Surgical History:  Procedure Laterality Date  . ABDOMINAL HYSTERECTOMY     TAH BSO  . BREAST LUMPECTOMY  1971   benign  . CESAREAN SECTION     x2  . COLPOSCOPY    . EYE SURGERY  2003   left  . TOTAL ABDOMINAL HYSTERECTOMY W/ BILATERAL SALPINGOOPHORECTOMY  1981  . TRIGGER FINGER RELEASE  07/28/2012   Procedure: RELEASE TRIGGER FINGER/A-1 PULLEY;  Surgeon: Cammie Sickle., MD;  Location: [place];  Service: Orthopedics;  Laterality: Right;  EXCISION CYST RIGHT LONG A-1 RELEASE A-1 RIGHT LONG     There were no vitals filed for this visit.  Subjective Assessment - 08/02/18 0936    Subjective  doing well - no falls, no pain; wants to  review HEP from last session    Pertinent History  Limb-girdle MD, HTN, obesity, osteopenia    Patient Stated Goals  "I want to stay out of the wheelchair."    Currently in Pain?  No/denies    Multiple Pain Sites  No                       [facility] Adult PT Treatment/Exercise - 08/02/18 0001      Exercises   Exercises  Knee/Hip;Other Exercises    Other Exercises   scap retraction x 10 reps - cueing for form      Knee/Hip Exercises: Seated   Long Arc Quad  Both;1 set;10 reps    Sit to [employer]  2 sets;5 reps;without UE support      Knee/Hip Exercises: Supine   Bridges  Strengthening;Both;2 sets;5 reps    Straight Leg Raises  Strengthening;Both;2 sets;5 reps    Other Supine Knee/Hip Exercises  supine clam (fallouts) 2 x 5 reps          Balance Exercises - 08/02/18 1107      Balance Exercises: Standing   Standing Eyes Opened  Wide (BOA);Head turns   horizontal and vertical head turns; trunk rotation  Sidestepping  5 reps   sidestepping in place   Marching Limitations  marching in place at rollator x 8 reps each LE; cueing to reduce UE weight shift       Access Code: [email or web address]  URL: [email or web address]  Date: 08/02/2018  Prepared by: Allena Katz   Exercises  Seated Long Arc Quad - 10 reps - 2 sets - 1x daily - 7x weekly  Supine Straight Leg Raises - 5 reps - 2 sets - 1x daily - 7x weekly  Bent Knee Fallouts - 5 reps - 2 sets - 1x daily - 7x weekly  Supine Bridge - 5 reps - 2 sets - 1x daily - 7x weekly  Seated Scapular Retraction - 5-10 reps - 2 sets - 1x daily - 7x weekly     PT Short Term Goals - 07/18/18 1540      PT SHORT TERM GOAL #1   Title  patient to be independent with initial HEP     Time  4    Period  Weeks    Status  New    Target Date  08/15/18      PT SHORT TERM GOAL #2   Title  patient to improve BERG by >/= 4 points demonstrating improved balance    Time  4    Period  Weeks    Status  New    Target Date   08/15/18      PT SHORT TERM GOAL #3   Title  patient to improve TUG by >/= 3 seconds    Time  4    Period  Weeks    Status  New    Target Date  08/15/18      PT SHORT TERM GOAL #4   Title  patient to demonstrate gait of >/= 400' with LRAD over level indoor/outdoor surfaces including curbs/inclines with appropriate safety awareness and without LOB    Time  4    Period  Weeks    Status  New    Target Date  08/15/18        PT Long Term Goals - 07/18/18 1542      PT LONG TERM GOAL #1   Title  patient to be independent with advanced HEP    Time  8    Period  Weeks    Status  New    Target Date  09/12/18      PT LONG TERM GOAL #2   Title  patient to improve gait speed to >/= 3.90ft/sec wtih LRAD demonstrating improved functional mobility    Time  8    Period  Weeks    Status  New    Target Date  09/12/18      PT LONG TERM GOAL #3   Title  patient to improve BERG by >/= 8 points demonstrating reduced fall risk    Time  8    Period  Weeks    Status  New    Target Date  09/12/18      PT LONG TERM GOAL #4   Title  patient to demonstrate gait of >/=800' over various indoor and outdoor surfaces without LOB or overt instability with LRAD    Time  8    Period  Weeks    Status  New    Target Date  09/12/18      PT LONG TERM GOAL #5   Title  patient to improve TUG by >/= 7 seconds    Time  8    Period  Weeks    Status  New    Target Date  09/12/18            Plan - 08/02/18 9166    Clinical Impression Statement  Patient today requesting review of HEP today as she is unsure if she is performing correctly. Review of all activities with slight modifications made. Progression of proximal hip strengthening to patient tolerance with HEP handout given. Ample rest breaks in session to reduce fatigue and muscle soreness. Making good progress towards all goals.     Rehab Potential  Good    PT Frequency  2x / week    PT Duration  8 weeks    PT Treatment/Interventions   ADLs/Self Care Home Management;Therapeutic exercise;Therapeutic activities;Functional mobility training;Stair training;Gait training;DME Instruction;Balance training;Neuromuscular re-education;Patient/family education;Manual techniques;Orthotic Fit/Training;Taping    PT Next Visit Plan  Review HEP for gentle strengthening and balance; gait - trial forearm crutches.    PT Home Exercise Plan  [email or web address]     Consulted and Agree with Plan of Care  Patient       Patient will benefit from skilled therapeutic intervention in order to improve the following deficits and impairments:  Abnormal gait, Decreased activity tolerance, Decreased balance, Decreased safety awareness, Decreased mobility, Decreased strength, Difficulty walking  Visit Diagnosis: Unsteadiness on feet  Other abnormalities of gait and mobility  Muscle weakness (generalized)     Problem List Patient Active Problem List   Diagnosis Date Noted  . Abnormality of gait 12/02/2015  . Risk for falls 12/02/2015  . Sciatica neuralgia 06/10/2015  . Scoliosis (and kyphoscoliosis), idiopathic 06/10/2015  . 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 Cervical dysplasia   . IBS (irritable bowel syndrome)   . Macular hole of left eye   . Hypertension   . Pseudocholinesterase deficiency   . Muscular dystrophy ([place])   . CIN (conjunctival intraepithelial neoplasia)   . Osteoporosis   . Atrophic vaginitis      Lanney Gins, PT, DPT Supplemental Physical Therapist 08/02/18 11:29 AM Pager: [phone] Office: [place] [facility] [address] [place], [place], [address] Phone: [phone]   Fax:  [phone]  Name: KALYNA PAOLELLA MRN: [id number] Date of Birth: 08/11/1930

## 2018-08-03 ENCOUNTER — Encounter (HOSPITAL_COMMUNITY): Payer: Self-pay

## 2018-08-03 ENCOUNTER — Ambulatory Visit (HOSPITAL_COMMUNITY)
Admission: RE | Admit: 2018-08-03 | Discharge: 2018-08-03 | Disposition: A | Discharge status: Home / Self Care | Payer: PPO | Source: Ambulatory Visit | Attending: Endocrinology | Admitting: Endocrinology

## 2018-08-03 DIAGNOSIS — M9902 Segmental and somatic dysfunction of thoracic region: Secondary | ICD-10-CM | POA: Diagnosis not present

## 2018-08-03 DIAGNOSIS — M9901 Segmental and somatic dysfunction of cervical region: Secondary | ICD-10-CM | POA: Diagnosis not present

## 2018-08-03 DIAGNOSIS — M810 Age-related osteoporosis without current pathological fracture: Secondary | ICD-10-CM | POA: Diagnosis not present

## 2018-08-03 DIAGNOSIS — M50322 Other cervical disc degeneration at C5-C6 level: Secondary | ICD-10-CM | POA: Diagnosis not present

## 2018-08-03 DIAGNOSIS — M5134 Other intervertebral disc degeneration, thoracic region: Secondary | ICD-10-CM | POA: Diagnosis not present

## 2018-08-03 MED ORDER — DENOSUMAB 60 MG/ML ~~LOC~~ SOSY
60.0000 mg | PREFILLED_SYRINGE | Freq: Once | SUBCUTANEOUS | Status: AC
Start: 2018-08-03 — End: 2018-08-03
  Administered 2018-08-03: 60 mg via SUBCUTANEOUS

## 2018-08-03 MED ORDER — DENOSUMAB 60 MG/ML ~~LOC~~ SOSY
PREFILLED_SYRINGE | SUBCUTANEOUS | Status: AC
  Filled 2018-08-03: qty 1

## 2018-08-03 NOTE — Progress Notes (Signed)
Prolia given.  Pt has had before.  Next appointment for 02/05/19 at 1200 given to pt, instruction sheet for prolia also given to pt.  Pt ambulatory with rolling walker and friend to lobby.

## 2018-08-03 NOTE — Discharge Instructions (Signed)
Denosumab injection °What is this medicine? °DENOSUMAB (den oh sue mab) slows bone breakdown. Prolia is used to treat osteoporosis in women after menopause and in men. Xgeva is used to treat a high calcium level due to cancer and to prevent bone fractures and other bone problems caused by multiple myeloma or cancer bone metastases. Xgeva is also used to treat giant cell tumor of the bone. °This medicine may be used for other purposes; ask your health care provider or pharmacist if you have questions. °COMMON BRAND NAME(S): Prolia, XGEVA °What should I tell my health care provider before I take this medicine? °They need to know if you have any of these conditions: °-dental disease °-having surgery or tooth extraction °-infection °-kidney disease °-low levels of calcium or Vitamin D in the blood °-malnutrition °-on hemodialysis °-skin conditions or sensitivity °-thyroid or parathyroid disease °-an unusual reaction to denosumab, other medicines, foods, dyes, or preservatives °-pregnant or trying to get pregnant °-breast-feeding °How should I use this medicine? °This medicine is for injection under the skin. It is given by a health care professional in a hospital or clinic setting. °If you are getting Prolia, a special MedGuide will be given to you by the pharmacist with each prescription and refill. Be sure to read this information carefully each time. °For Prolia, talk to your pediatrician regarding the use of this medicine in children. Special care may be needed. For Xgeva, talk to your pediatrician regarding the use of this medicine in children. While this drug may be prescribed for children as young as 13 years for selected conditions, precautions do apply. °Overdosage: If you think you have taken too much of this medicine contact a poison control center or emergency room at once. °NOTE: This medicine is only for you. Do not share this medicine with others. °What if I miss a dose? °It is important not to miss your  dose. Call your doctor or health care professional if you are unable to keep an appointment. °What may interact with this medicine? °Do not take this medicine with any of the following medications: °-other medicines containing denosumab °This medicine may also interact with the following medications: °-medicines that lower your chance of fighting infection °-steroid medicines like prednisone or cortisone °This list may not describe all possible interactions. Give your health care provider a list of all the medicines, herbs, non-prescription drugs, or dietary supplements you use. Also tell them if you smoke, drink alcohol, or use illegal drugs. Some items may interact with your medicine. °What should I watch for while using this medicine? °Visit your doctor or health care professional for regular checks on your progress. Your doctor or health care professional may order blood tests and other tests to see how you are doing. °Call your doctor or health care professional for advice if you get a fever, chills or sore throat, or other symptoms of a cold or flu. Do not treat yourself. This drug may decrease your body's ability to fight infection. Try to avoid being around people who are sick. °You should make sure you get enough calcium and vitamin D while you are taking this medicine, unless your doctor tells you not to. Discuss the foods you eat and the vitamins you take with your health care professional. °See your dentist regularly. Brush and floss your teeth as directed. Before you have any dental work done, tell your dentist you are receiving this medicine. °Do not become pregnant while taking this medicine or for 5 months after stopping   it. Talk with your doctor or health care professional about your birth control options while taking this medicine. Women should inform their doctor if they wish to become pregnant or think they might be pregnant. There is a potential for serious side effects to an unborn child. Talk  to your health care professional or pharmacist for more information. What side effects may I notice from receiving this medicine? Side effects that you should report to your doctor or health care professional as soon as possible: -allergic reactions like skin rash, itching or hives, swelling of the face, lips, or tongue -bone pain -breathing problems -dizziness -jaw pain, especially after dental work -redness, blistering, peeling of the skin -signs and symptoms of infection like fever or chills; cough; sore throat; pain or trouble passing urine -signs of low calcium like fast heartbeat, muscle cramps or muscle pain; pain, tingling, numbness in the hands or feet; seizures -unusual bleeding or bruising -unusually weak or tired Side effects that usually do not require medical attention (report to your doctor or health care professional if they continue or are bothersome): -constipation -diarrhea -headache -joint pain -loss of appetite -muscle pain -runny nose -tiredness -upset stomach This list may not describe all possible side effects. Call your doctor for medical advice about side effects. You may report side effects to FDA at 1-800-FDA-1088. Where should I keep my medicine? This medicine is only given in a clinic, doctor's office, or other health care setting and will not be stored at home. NOTE: This sheet is a summary. It may not cover all possible information. If you have questions about this medicine, talk to your doctor, pharmacist, or health care provider.  2018 Elsevier/Gold Standard (2016-10-19 19:17:21)

## 2018-08-07 DIAGNOSIS — M9902 Segmental and somatic dysfunction of thoracic region: Secondary | ICD-10-CM | POA: Diagnosis not present

## 2018-08-07 DIAGNOSIS — M9901 Segmental and somatic dysfunction of cervical region: Secondary | ICD-10-CM | POA: Diagnosis not present

## 2018-08-07 DIAGNOSIS — M5134 Other intervertebral disc degeneration, thoracic region: Secondary | ICD-10-CM | POA: Diagnosis not present

## 2018-08-07 DIAGNOSIS — M50322 Other cervical disc degeneration at C5-C6 level: Secondary | ICD-10-CM | POA: Diagnosis not present

## 2018-08-08 ENCOUNTER — Encounter: Payer: Self-pay | Admitting: Physical Therapy

## 2018-08-08 ENCOUNTER — Ambulatory Visit: Payer: PPO | Admitting: Physical Therapy

## 2018-08-08 DIAGNOSIS — M6281 Muscle weakness (generalized): Secondary | ICD-10-CM

## 2018-08-08 DIAGNOSIS — R2681 Unsteadiness on feet: Secondary | ICD-10-CM

## 2018-08-08 DIAGNOSIS — R2689 Other abnormalities of gait and mobility: Secondary | ICD-10-CM

## 2018-08-08 NOTE — Therapy (Signed)
[facility] [address] [place], [place], [address] Phone: [phone]   Fax:  [phone]  Physical Therapy Treatment  Patient Details  Name: Catherine Harding MRN: [id number] Date of Birth: 07-Oct-1930 Referring Provider (PT): Dennie Bible, NP   Encounter Date: 08/08/2018  PT End of Session - 08/08/18 0935    Visit Number  4    Number of Visits  17    Date for PT Re-Evaluation  09/12/18    Authorization Type  HTA    PT Start Time  0933    PT Stop Time  1013    PT Time Calculation (min)  40 min    Activity Tolerance  Patient tolerated treatment well    Behavior During Therapy  [facility] for tasks assessed/performed       Past Medical History:  Diagnosis Date  . Arthritis   . Atrophic vaginitis   . Basal cell carcinoma   . Cervical dysplasia   . Complication of anesthesia    pseudocholinesterase deficiency-hard to wake up  . Falls   . GERD (gastroesophageal reflux disease)   . Hypertension   . IBS (irritable bowel syndrome)   . Jaw atrophy   . 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 Macular hole of left eye   . Muscular dystrophy ([place])   . Osteoporosis    pelvic fracture  . Pseudocholinesterase deficiency   . Scoliosis     Past Surgical History:  Procedure Laterality Date  . ABDOMINAL HYSTERECTOMY     TAH BSO  . BREAST LUMPECTOMY  1971   benign  . CESAREAN SECTION     x2  . COLPOSCOPY    . EYE SURGERY  2003   left  . TOTAL ABDOMINAL HYSTERECTOMY W/ BILATERAL SALPINGOOPHORECTOMY  1981  . TRIGGER FINGER RELEASE  07/28/2012   Procedure: RELEASE TRIGGER FINGER/A-1 PULLEY;  Surgeon: Cammie Sickle., MD;  Location: [place];  Service: Orthopedics;  Laterality: Right;  EXCISION CYST RIGHT LONG A-1 RELEASE A-1 RIGHT LONG     There were no vitals filed for this visit.  Subjective Assessment - 08/08/18 0935    Subjective  had a new great-grandchild this weekend;  unable to do HEP    Pertinent History  Limb-girdle MD, HTN, obesity, osteopenia    Patient Stated Goals  "I want to stay out of the wheelchair."    Currently in Pain?  No/denies    Multiple Pain Sites  No                       [facility] Adult PT Treatment/Exercise - 08/08/18 0001      Exercises   Exercises  Knee/Hip;Other Exercises    Other Exercises   scap retraction 2 x 10 reps - cueing for form      Knee/Hip Exercises: Standing   Heel Raises  Both;2 sets;10 reps   UE support   Other Standing Knee Exercises  alternating toe taps to 6" step - 1 UE support 2 x 5      Knee/Hip Exercises: Seated   Other Seated Knee/Hip Exercises  seated on green disc: marching 2 x 5, LAQ 2 x 5 - feet resting on 6" step      Knee/Hip Exercises: Sidelying   Hip ABduction  Strengthening;Both;2 sets;10 reps          Balance Exercises - 08/08/18 1449      Balance Exercises: Standing   Standing  Eyes Opened  Narrow base of support (BOS);Head turns;2 reps   60 sec horizontal; 60 sec vertical   Standing Eyes Closed  Narrow base of support (BOS);2 reps;30 secs   UE support   Tandem Stance  Eyes open;Upper extremity support 2;2 reps;30 secs          PT Short Term Goals - 07/18/18 1540      PT SHORT TERM GOAL #1   Title  patient to be independent with initial HEP     Time  4    Period  Weeks    Status  New    Target Date  08/15/18      PT SHORT TERM GOAL #2   Title  patient to improve BERG by >/= 4 points demonstrating improved balance    Time  4    Period  Weeks    Status  New    Target Date  08/15/18      PT SHORT TERM GOAL #3   Title  patient to improve TUG by >/= 3 seconds    Time  4    Period  Weeks    Status  New    Target Date  08/15/18      PT SHORT TERM GOAL #4   Title  patient to demonstrate gait of >/= 400' with LRAD over level indoor/outdoor surfaces including curbs/inclines with appropriate safety awareness and without LOB    Time  4    Period  Weeks     Status  New    Target Date  08/15/18        PT Long Term Goals - 07/18/18 1542      PT LONG TERM GOAL #1   Title  patient to be independent with advanced HEP    Time  8    Period  Weeks    Status  New    Target Date  09/12/18      PT LONG TERM GOAL #2   Title  patient to improve gait speed to >/= 3.33ft/sec wtih LRAD demonstrating improved functional mobility    Time  8    Period  Weeks    Status  New    Target Date  09/12/18      PT LONG TERM GOAL #3   Title  patient to improve BERG by >/= 8 points demonstrating reduced fall risk    Time  8    Period  Weeks    Status  New    Target Date  09/12/18      PT LONG TERM GOAL #4   Title  patient to demonstrate gait of >/=800' over various indoor and outdoor surfaces without LOB or overt instability with LRAD    Time  8    Period  Weeks    Status  New    Target Date  09/12/18      PT LONG TERM GOAL #5   Title  patient to improve TUG by >/= 7 seconds    Time  8    Period  Weeks    Status  New    Target Date  09/12/18            Plan - 08/08/18 0936    Clinical Impression Statement  Patient reporting limited HEP compliance from last session due to new birth in the family. Session today focusing on functional strength and mobility. Various LE strengthening throughout session to reduce stress on 1 muscle group. Patient continues to require ample  rest breaks. Most difficulty with seated on compliant surface for core activation and with LE strengthening. Will continue to progress strength and mobility.    Rehab Potential  Good    PT Frequency  2x / week    PT Duration  8 weeks    PT Treatment/Interventions  ADLs/Self Care Home Management;Therapeutic exercise;Therapeutic activities;Functional mobility training;Stair training;Gait training;DME Instruction;Balance training;Neuromuscular re-education;Patient/family education;Manual techniques;Orthotic Fit/Training;Taping    PT Home Exercise Plan  [email or web address]     Consulted and  Agree with Plan of Care  Patient       Patient will benefit from skilled therapeutic intervention in order to improve the following deficits and impairments:  Abnormal gait, Decreased activity tolerance, Decreased balance, Decreased safety awareness, Decreased mobility, Decreased strength, Difficulty walking  Visit Diagnosis: Unsteadiness on feet  Other abnormalities of gait and mobility  Muscle weakness (generalized)     Problem List Patient Active Problem List   Diagnosis Date Noted  . Abnormality of gait 12/02/2015  . Risk for falls 12/02/2015  . Sciatica neuralgia 06/10/2015  . Scoliosis (and kyphoscoliosis), idiopathic 06/10/2015  . 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 Cervical dysplasia   . IBS (irritable bowel syndrome)   . Macular hole of left eye   . Hypertension   . Pseudocholinesterase deficiency   . Muscular dystrophy ([place])   . CIN (conjunctival intraepithelial neoplasia)   . Osteoporosis   . Atrophic vaginitis      Lanney Gins, PT, DPT Supplemental Physical Therapist 08/08/18 2:56 PM Pager: [phone] Office: [place] [facility] [address] [place], [place], [address] Phone: [phone]   Fax:  [phone]  Name: Catherine Harding MRN: [id number] Date of Birth: 1930/09/04

## 2018-08-09 ENCOUNTER — Ambulatory Visit: Payer: PPO | Admitting: Physical Therapy

## 2018-08-09 ENCOUNTER — Encounter: Payer: Self-pay | Admitting: Physical Therapy

## 2018-08-09 DIAGNOSIS — M50322 Other cervical disc degeneration at C5-C6 level: Secondary | ICD-10-CM | POA: Diagnosis not present

## 2018-08-09 DIAGNOSIS — M9901 Segmental and somatic dysfunction of cervical region: Secondary | ICD-10-CM | POA: Diagnosis not present

## 2018-08-09 DIAGNOSIS — R2681 Unsteadiness on feet: Secondary | ICD-10-CM | POA: Diagnosis not present

## 2018-08-09 DIAGNOSIS — M9902 Segmental and somatic dysfunction of thoracic region: Secondary | ICD-10-CM | POA: Diagnosis not present

## 2018-08-09 DIAGNOSIS — M5134 Other intervertebral disc degeneration, thoracic region: Secondary | ICD-10-CM | POA: Diagnosis not present

## 2018-08-09 DIAGNOSIS — R2689 Other abnormalities of gait and mobility: Secondary | ICD-10-CM

## 2018-08-09 DIAGNOSIS — M6281 Muscle weakness (generalized): Secondary | ICD-10-CM

## 2018-08-09 NOTE — Therapy (Signed)
[facility] [address] [place], [place], [address] Phone: [phone]   Fax:  [phone]  Physical Therapy Treatment  Patient Details  Name: Catherine Harding MRN: [id number] Date of Birth: Feb 06, 1930 Referring Provider (PT): Dennie Bible, NP   Encounter Date: 08/09/2018  PT End of Session - 08/09/18 1016    Visit Number  5    Number of Visits  17    Date for PT Re-Evaluation  09/12/18    Authorization Type  HTA    PT Start Time  0933    PT Stop Time  1015    PT Time Calculation (min)  42 min    Activity Tolerance  Patient tolerated treatment well    Behavior During Therapy  [facility] for tasks assessed/performed       Past Medical History:  Diagnosis Date  . Arthritis   . Atrophic vaginitis   . Basal cell carcinoma   . Cervical dysplasia   . Complication of anesthesia    pseudocholinesterase deficiency-hard to wake up  . Falls   . GERD (gastroesophageal reflux disease)   . Hypertension   . IBS (irritable bowel syndrome)   . Jaw atrophy   . 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 Macular hole of left eye   . Muscular dystrophy ([place])   . Osteoporosis    pelvic fracture  . Pseudocholinesterase deficiency   . Scoliosis     Past Surgical History:  Procedure Laterality Date  . ABDOMINAL HYSTERECTOMY     TAH BSO  . BREAST LUMPECTOMY  1971   benign  . CESAREAN SECTION     x2  . COLPOSCOPY    . EYE SURGERY  2003   left  . TOTAL ABDOMINAL HYSTERECTOMY W/ BILATERAL SALPINGOOPHORECTOMY  1981  . TRIGGER FINGER RELEASE  07/28/2012   Procedure: RELEASE TRIGGER FINGER/A-1 PULLEY;  Surgeon: Cammie Sickle., MD;  Location: [facility];  Service: Orthopedics;  Laterality: Right;  EXCISION CYST RIGHT LONG A-1 RELEASE A-1 RIGHT LONG     There were no vitals filed for this visit.  Subjective Assessment - 08/09/18 0932    Subjective  Pt felt fine after yesterday's session, no  soreness.  Goes to orthopedic MD next week for shoulder pain.  Shoulder pain since 4 weeks ago catching herself from falling, since then more painful to use cane in R hand.    Pertinent History  Limb-girdle MD, HTN, obesity, osteopenia    Patient Stated Goals  "I want to stay out of the wheelchair."    Currently in Pain?  Yes    Pain Score  6     Pain Location  Shoulder    Pain Orientation  Right    Pain Descriptors / Indicators  Aching;Sharp    Pain Onset  More than a month ago    Pain Frequency  Intermittent    Aggravating Factors   driving    Pain Relieving Factors  rest                       [facility] Adult PT Treatment/Exercise - 08/09/18 0001      Transfers   Transfers  Sit to Stand;Stand to Sit    Sit to Stand  5: Supervision    Sit to Stand Details  Verbal cues for safe use of DME/AE   with forearm crutches   Stand to Sit  5: Supervision    Stand to  Sit Details (indicate cue type and reason)  Verbal cues for safe use of DME/AE   with forearm crutches     Ambulation/Gait   Ambulation/Gait  Yes    Ambulation/Gait Assistance  4: Min guard    Ambulation/Gait Assistance Details  instruction on 4 pt pattern with forearm crutches, pt progress to  contralateral 4 pt pattern.    Ambulation Distance (Feet)  50 Feet    Assistive device  Lofstrands    Gait Pattern  Step-through pattern;Decreased stride length;Decreased hip/knee flexion - right;Decreased hip/knee flexion - left;Trunk flexed    Ambulation Surface  Level;Indoor      Posture/Postural Control   Posture/Postural Control  Postural limitations    Postural Limitations  Rounded Shoulders;Forward head;Decreased lumbar lordosis;Increased thoracic kyphosis;Posterior pelvic tilt;Flexed trunk    Posture Comments  instruction for proper posture on solid surface progressing to compliant disc      Knee/Hip Exercises: Seated   Other Seated Knee/Hip Exercises  seated on green disc: marching 2 x 5, LAQ 2 x 5 - feet resting  on 4" step             PT Education - 08/09/18 1012    Education Details  Proper posture and weight shifting in sitting; discussed pros and cons of continuing to gait train with forearm crutches.    Person(s) Educated  Patient    Methods  Explanation;Demonstration;Verbal cues;Handout    Comprehension  Verbalized understanding;Returned demonstration       PT Short Term Goals - 07/18/18 1540      PT SHORT TERM GOAL #1   Title  patient to be independent with initial HEP     Time  4    Period  Weeks    Status  New    Target Date  08/15/18      PT SHORT TERM GOAL #2   Title  patient to improve BERG by >/= 4 points demonstrating improved balance    Time  4    Period  Weeks    Status  New    Target Date  08/15/18      PT SHORT TERM GOAL #3   Title  patient to improve TUG by >/= 3 seconds    Time  4    Period  Weeks    Status  New    Target Date  08/15/18      PT SHORT TERM GOAL #4   Title  patient to demonstrate gait of >/= 400' with LRAD over level indoor/outdoor surfaces including curbs/inclines with appropriate safety awareness and without LOB    Time  4    Period  Weeks    Status  New    Target Date  08/15/18        PT Long Term Goals - 07/18/18 1542      PT LONG TERM GOAL #1   Title  patient to be independent with advanced HEP    Time  8    Period  Weeks    Status  New    Target Date  09/12/18      PT LONG TERM GOAL #2   Title  patient to improve gait speed to >/= 3.23ft/sec wtih LRAD demonstrating improved functional mobility    Time  8    Period  Weeks    Status  New    Target Date  09/12/18      PT LONG TERM GOAL #3   Title  patient to improve BERG by >/=  8 points demonstrating reduced fall risk    Time  8    Period  Weeks    Status  New    Target Date  09/12/18      PT LONG TERM GOAL #4   Title  patient to demonstrate gait of >/=800' over various indoor and outdoor surfaces without LOB or overt instability with LRAD    Time  8    Period   Weeks    Status  New    Target Date  09/12/18      PT LONG TERM GOAL #5   Title  patient to improve TUG by >/= 7 seconds    Time  8    Period  Weeks    Status  New    Target Date  09/12/18            Plan - 08/09/18 1334    Clinical Impression Statement  Trialled forearm crutches per pt request this session.  Pt performed at min guard level with min cues for 4 pt gait pattern progressing to contralateral UE/LE movment.  Pt did report more strain felt in right shoulder at this time and training with crutches was discontinued for session. Postural training and LE/core strengthening were performed with instruction; pt demonstrating growing understanding during session.                                                                                    Rehab Potential  Good    PT Frequency  2x / week    PT Duration  8 weeks    PT Treatment/Interventions  ADLs/Self Care Home Management;Therapeutic exercise;Therapeutic activities;Functional mobility training;Stair training;Gait training;DME Instruction;Balance training;Neuromuscular re-education;Patient/family education;Manual techniques;Orthotic Fit/Training;Taping    PT Next Visit Plan  Review HEP for gentle strengthening and balance; gait - trial forearm crutches.    PT Home Exercise Plan  [email or web address]     Consulted and Agree with Plan of Care  Patient       Patient will benefit from skilled therapeutic intervention in order to improve the following deficits and impairments:  Abnormal gait, Decreased activity tolerance, Decreased balance, Decreased safety awareness, Decreased mobility, Decreased strength, Difficulty walking  Visit Diagnosis: Unsteadiness on feet  Other abnormalities of gait and mobility  Muscle weakness (generalized)     Problem List Patient Active Problem List   Diagnosis Date Noted  . Abnormality of gait 12/02/2015  . Risk for falls 12/02/2015  . Sciatica neuralgia 06/10/2015  . Scoliosis (and  kyphoscoliosis), idiopathic 06/10/2015  . 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 Cervical dysplasia   . IBS (irritable bowel syndrome)   . Macular hole of left eye   . Hypertension   . Pseudocholinesterase deficiency   . Muscular dystrophy ([place])   . CIN (conjunctival intraepithelial neoplasia)   . Osteoporosis   . Atrophic vaginitis     Bjorn Loser 08/09/2018, 1:42 PM  [facility] [address] [place], [place], [address] Phone: [phone]   Fax:  [phone]  Name: Catherine Harding MRN: [id number] Date of Birth: Sep 14, 1930

## 2018-08-14 ENCOUNTER — Encounter: Payer: Self-pay | Admitting: Physical Therapy

## 2018-08-14 ENCOUNTER — Ambulatory Visit: Payer: PPO | Attending: Endocrinology | Admitting: Physical Therapy

## 2018-08-14 DIAGNOSIS — R2681 Unsteadiness on feet: Secondary | ICD-10-CM | POA: Diagnosis not present

## 2018-08-14 DIAGNOSIS — M25511 Pain in right shoulder: Secondary | ICD-10-CM | POA: Diagnosis not present

## 2018-08-14 DIAGNOSIS — R2689 Other abnormalities of gait and mobility: Secondary | ICD-10-CM | POA: Insufficient documentation

## 2018-08-14 DIAGNOSIS — R293 Abnormal posture: Secondary | ICD-10-CM | POA: Insufficient documentation

## 2018-08-14 DIAGNOSIS — M6281 Muscle weakness (generalized): Secondary | ICD-10-CM | POA: Insufficient documentation

## 2018-08-14 NOTE — Patient Instructions (Signed)
Access Code: [email or web address]  URL: [email or web address]  Date: 07/31/2018  Prepared by: Bjorn Loser  Performed during therapy today 08/14/18 with min cues for technique and intermittent UE support  Exercises  Romberg Stance with Head Nods and head turns - 5 reps - 2 sets - 1-2x daily - 5x weekly  Standing Thoracic and Cervical Rotation standing at the sink - 5 reps - 2 sets - 1-2x daily - 5x weekly  Side Stepping back and forth with Counter Support - 5 reps - 2 sets - 1x daily - 5x weekly  March in Place - 5 reps - 2 sets - 1x daily - 5x weekly

## 2018-08-14 NOTE — Therapy (Signed)
[facility] [address] [place], [place], [address] Phone: [phone]   Fax:  [phone]  Physical Therapy Treatment  Patient Details  Name: Catherine Harding MRN: [id number] Date of Birth: 1930/02/07 Referring Provider (PT): Dennie Bible, NP   Encounter Date: 08/14/2018  PT End of Session - 08/14/18 1203    Visit Number  6    Number of Visits  17    Date for PT Re-Evaluation  09/12/18    Authorization Type  HTA    PT Start Time  1105    PT Stop Time  1155    PT Time Calculation (min)  50 min    Activity Tolerance  Patient tolerated treatment well    Behavior During Therapy  [facility] for tasks assessed/performed       Past Medical History:  Diagnosis Date  . Arthritis   . Atrophic vaginitis   . Basal cell carcinoma   . Cervical dysplasia   . Complication of anesthesia    pseudocholinesterase deficiency-hard to wake up  . Falls   . GERD (gastroesophageal reflux disease)   . Hypertension   . IBS (irritable bowel syndrome)   . Jaw atrophy   . 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 Macular hole of left eye   . Muscular dystrophy ([place])   . Osteoporosis    pelvic fracture  . Pseudocholinesterase deficiency   . Scoliosis     Past Surgical History:  Procedure Laterality Date  . ABDOMINAL HYSTERECTOMY     TAH BSO  . BREAST LUMPECTOMY  1971   benign  . CESAREAN SECTION     x2  . COLPOSCOPY    . EYE SURGERY  2003   left  . TOTAL ABDOMINAL HYSTERECTOMY W/ BILATERAL SALPINGOOPHORECTOMY  1981  . TRIGGER FINGER RELEASE  07/28/2012   Procedure: RELEASE TRIGGER FINGER/A-1 PULLEY;  Surgeon: Cammie Sickle., MD;  Location: [facility];  Service: Orthopedics;  Laterality: Right;  EXCISION CYST RIGHT LONG A-1 RELEASE A-1 RIGHT LONG     There were no vitals filed for this visit.  Subjective Assessment - 08/14/18 1107    Subjective  Pt is trying to do exercises MD gave her for  her shoulder, " they are a little hard to follow."  Goes to orthopedic this week for shoulder.    Pertinent History  Limb-girdle MD, HTN, obesity, osteopenia    Patient Stated Goals  "I want to stay out of the wheelchair."    Currently in Pain?  No/denies    Pain Score  6     Pain Location  Shoulder    Pain Orientation  Right    Pain Descriptors / Indicators  Aching;Sharp    Pain Onset  More than a month ago    Pain Frequency  Intermittent         [facility] PT Assessment - 08/14/18 0001      Berg Balance Test   Sit to Stand  Able to stand without using hands and stabilize independently    Standing Unsupported  Able to stand safely 2 minutes    Sitting with Back Unsupported but Feet Supported on Floor or Stool  Able to sit safely and securely 2 minutes    Stand to Sit  Sits safely with minimal use of hands    Transfers  Able to transfer safely, definite need of hands    Standing Unsupported with Eyes Closed  Able to stand 10 seconds  safely    Standing Ubsupported with Feet Together  Needs help to attain position but able to stand for 30 seconds with feet together    From Standing, Reach Forward with Outstretched Arm  Can reach forward >5 cm safely (2")    From Standing Position, Pick up Object from Floor  Able to pick up shoe, needs supervision    From Standing Position, Turn to Look Behind Over each Shoulder  Looks behind from both sides and weight shifts well    Turn 360 Degrees  Able to turn 360 degrees safely but slowly    Standing Unsupported, Alternately Place Feet on Step/Stool  Able to complete >2 steps/needs minimal assist    Standing Unsupported, One Foot in Front  Able to take small step independently and hold 30 seconds    Standing on One Leg  Tries to lift leg/unable to hold 3 seconds but remains standing independently    Total Score  39    Berg comment:  37-45 significant (>80%) 52-55 lower (> 25%)      Timed Up and Go Test   TUG  Normal TUG    Normal TUG (seconds)  12.38          ccess Code: [email or web address]  URL: [email or web address]  Date: 07/31/2018  Prepared by: Bjorn Loser  Performed during therapy today 08/14/18 with min cues for technique and intermittent UE support  Exercises  Romberg Stance with Head Nods and head turns - 5 reps - 2 sets - 1-2x daily - 5x weekly  Standing Thoracic and Cervical Rotation standing at the sink - 5 reps - 2 sets - 1-2x daily - 5x weekly  Side Stepping back and forth with Counter Support - 5 reps - 2 sets - 1x daily - 5x weekly  March in Place - 5 reps - 2 sets - 1x daily - 5x weekly                  PT Education - 08/14/18 1122    Education Details  Reviewed current HEP ; discussed goals checked and POC.   Person(s) Educated  Patient    Methods  Explanation;Demonstration;Verbal cues;Handout    Comprehension  Verbalized understanding;Returned demonstration;Verbal cues required;Need further instruction       PT Short Term Goals - 08/14/18 1204      PT SHORT TERM GOAL #1   Title  patient to be independent with initial HEP     Baseline  08/14/18 Met.    Time  4    Period  Weeks    Status  Achieved      PT SHORT TERM GOAL #2   Title  patient to improve BERG by >/= 4 points demonstrating improved balance    Baseline  08/14/18,  39/56, Met.    Time  4    Period  Weeks    Status  Achieved      PT SHORT TERM GOAL #3   Title  patient to improve TUG by >/= 3 seconds    Baseline  08/14/18, 12.38 seconds with rollator, Met.     Time  4    Period  Weeks    Status  Achieved      PT SHORT TERM GOAL #4   Title  patient to demonstrate gait of >/= 400' with LRAD over level indoor/outdoor surfaces including curbs/inclines with appropriate safety awareness and without LOB    Time  4    Period  Weeks    Status  On-going        PT Long Term Goals - 07/18/18 1542      PT LONG TERM GOAL #1   Title  patient to be independent with advanced HEP    Time  8    Period  Weeks    Status  New     Target Date  09/12/18      PT LONG TERM GOAL #2   Title  patient to improve gait speed to >/= 3.8f/sec wtih LRAD demonstrating improved functional mobility    Time  8    Period  Weeks    Status  New    Target Date  09/12/18      PT LONG TERM GOAL #3   Title  patient to improve BERG by >/= 8 points demonstrating reduced fall risk    Time  8    Period  Weeks    Status  New    Target Date  09/12/18      PT LONG TERM GOAL #4   Title  patient to demonstrate gait of >/=800' over various indoor and outdoor surfaces without LOB or overt instability with LRAD    Time  8    Period  Weeks    Status  New    Target Date  09/12/18      PT LONG TERM GOAL #5   Title  patient to improve TUG by >/= 7 seconds    Time  8    Period  Weeks    Status  New    Target Date  09/12/18            Plan - 08/14/18 1206    Clinical Impression Statement  Pt met STGs #1-3 demonstrating improvement with balance, strength, and confidence with mobility.  STG #4 was not tested due to time.    Rehab Potential  Good    PT Frequency  2x / week    PT Duration  8 weeks    PT Treatment/Interventions  ADLs/Self Care Home Management;Therapeutic exercise;Therapeutic activities;Functional mobility training;Stair training;Gait training;DME Instruction;Balance training;Neuromuscular re-education;Patient/family education;Manual techniques;Orthotic Fit/Training;Taping    PT Next Visit Plan  Please check STG#4.  Gait training with SPC.  Discuss with pt options for follow up therapy for R shoulder / orthopedic recommendations.  Review strength HEP with code BDanielsand [id number](given10/21/2019 )    Consulted and Agree with Plan of Care  Patient       Patient will benefit from skilled therapeutic intervention in order to improve the following deficits and impairments:  Abnormal gait, Decreased activity tolerance, Decreased balance, Decreased safety awareness, Decreased mobility,  Decreased strength, Difficulty walking  Visit Diagnosis: Unsteadiness on feet  Other abnormalities of gait and mobility  Muscle weakness (generalized)     Problem List Patient Active Problem List   Diagnosis Date Noted  . Abnormality of gait 12/02/2015  . Risk for falls 12/02/2015  . Sciatica neuralgia 06/10/2015  . Scoliosis (and kyphoscoliosis), idiopathic 06/10/2015  . [place] 07/05/2013  . Cervical dysplasia   . IBS (irritable bowel syndrome)   . Macular hole of left eye   . Hypertension   . Pseudocholinesterase deficiency   . [place]cular dystrophy ([place]   . CIN (conjunctival intraepithelial neoplasia)   . Osteoporosis   . Atrophic vaginitis     KBjorn Loser PTA  08/14/18, 12:16 PM [id number]  [address] Phone: [phone]   Fax:  [phone]  Name: Catherine Harding MRN: [id number] Date of Birth: Oct 18, 1929

## 2018-08-16 ENCOUNTER — Ambulatory Visit: Payer: PPO | Admitting: Physical Therapy

## 2018-08-16 DIAGNOSIS — M50322 Other cervical disc degeneration at C5-C6 level: Secondary | ICD-10-CM | POA: Diagnosis not present

## 2018-08-16 DIAGNOSIS — M9901 Segmental and somatic dysfunction of cervical region: Secondary | ICD-10-CM | POA: Diagnosis not present

## 2018-08-16 DIAGNOSIS — M5134 Other intervertebral disc degeneration, thoracic region: Secondary | ICD-10-CM | POA: Diagnosis not present

## 2018-08-16 DIAGNOSIS — M9902 Segmental and somatic dysfunction of thoracic region: Secondary | ICD-10-CM | POA: Diagnosis not present

## 2018-08-21 ENCOUNTER — Ambulatory Visit: Payer: PPO | Admitting: Rehabilitation

## 2018-08-21 ENCOUNTER — Encounter: Payer: Self-pay | Admitting: Rehabilitation

## 2018-08-21 DIAGNOSIS — R2689 Other abnormalities of gait and mobility: Secondary | ICD-10-CM

## 2018-08-21 DIAGNOSIS — G7100 Muscular dystrophy, unspecified: Secondary | ICD-10-CM | POA: Diagnosis not present

## 2018-08-21 DIAGNOSIS — R2681 Unsteadiness on feet: Secondary | ICD-10-CM

## 2018-08-21 DIAGNOSIS — M6281 Muscle weakness (generalized): Secondary | ICD-10-CM

## 2018-08-21 DIAGNOSIS — M25511 Pain in right shoulder: Secondary | ICD-10-CM | POA: Diagnosis not present

## 2018-08-21 NOTE — Therapy (Signed)
[facility] [address] [place], [place], [address] Phone: [phone]   Fax:  [phone]  Physical Therapy Treatment  Patient Details  Name: Catherine Harding MRN: [id number] Date of Birth: Feb 22, 1930 Referring Provider (PT): Dennie Bible, NP   Encounter Date: 08/21/2018  PT End of Session - 08/21/18 1157    Visit Number  7    Number of Visits  17    Date for PT Re-Evaluation  09/12/18    Authorization Type  HTA    PT Start Time  1018    PT Stop Time  1102    PT Time Calculation (min)  44 min    Activity Tolerance  Patient tolerated treatment well    Behavior During Therapy  [facility] for tasks assessed/performed       Past Medical History:  Diagnosis Date  . Arthritis   . Atrophic vaginitis   . Basal cell carcinoma   . Cervical dysplasia   . Complication of anesthesia    pseudocholinesterase deficiency-hard to wake up  . Falls   . GERD (gastroesophageal reflux disease)   . Hypertension   . IBS (irritable bowel syndrome)   . Jaw atrophy   . 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 Macular hole of left eye   . Muscular dystrophy ([place])   . Osteoporosis    pelvic fracture  . Pseudocholinesterase deficiency   . Scoliosis     Past Surgical History:  Procedure Laterality Date  . ABDOMINAL HYSTERECTOMY     TAH BSO  . BREAST LUMPECTOMY  1971   benign  . CESAREAN SECTION     x2  . COLPOSCOPY    . EYE SURGERY  2003   left  . TOTAL ABDOMINAL HYSTERECTOMY W/ BILATERAL SALPINGOOPHORECTOMY  1981  . TRIGGER FINGER RELEASE  07/28/2012   Procedure: RELEASE TRIGGER FINGER/A-1 PULLEY;  Surgeon: Cammie Sickle., MD;  Location: [facility];  Service: Orthopedics;  Laterality: Right;  EXCISION CYST RIGHT LONG A-1 RELEASE A-1 RIGHT LONG     There were no vitals filed for this visit.  Subjective Assessment - 08/21/18 1127    Subjective  Pt presents having just visited ortho MD  who sent pt with therapy prescription stating R rotator cuff tear (unsure of grade) and to begin treating.      Pertinent History  Limb-girdle MD, HTN, obesity, osteopenia    Patient Stated Goals  "I want to stay out of the wheelchair."    Currently in Pain?  Yes    Pain Score  5    only hurts when moving   Pain Location  Shoulder    Pain Orientation  Right    Pain Descriptors / Indicators  Aching    Pain Onset  More than a month ago    Pain Frequency  Intermittent    Aggravating Factors   driving, moving arm     Pain Relieving Factors  rest, lying on heating pad                       [facility] Adult PT Treatment/Exercise - 08/21/18 1020      Ambulation/Gait   Ambulation/Gait  Yes    Ambulation/Gait Assistance  5: Supervision;4: Min guard    Ambulation/Gait Assistance Details  Pt ambulatory x 115' with rollator at S to mod I level.  Note that pt has very trendelenburg gait pattern with hip weakness noted on the L.  During second bout of gait and following hip strengthening therex, provided cues for improved L hip activation during stance and note marked improvement in quality of gait with slight decrease in gait speed.      Ambulation Distance (Feet)  115 Feet   x 2 reps   Assistive device  4-wheeled walker    Gait Pattern  Step-through pattern;Decreased stride length;Decreased hip/knee flexion - right;Decreased hip/knee flexion - left;Trunk flexed    Ambulation Surface  Level;Indoor      Self-Care   Self-Care  Other Self-Care Comments    Other Self-Care Comments   Provided education that PT will plan to go over her schedule and get her in for more formal R shoulder assessment/evaluation and then also a few visits on primary PT that can more appropriately evaluate and treat shoulder deficits.  Pt verbalized understanding.  PT did provide two gentle AAROM exercises with wooden dowel.  See pt instructions and below.  Educated on use of regular RW vs rollator in community (as  long as not walking long distances to need seat) in order to provide ease of transfer as she reports increased pain and difficulty with loading/unloading from car.  Demonstrated improved technique to load RW vs rollator.   Pt verbalized understanding.  Also educated on use of ice for R shoulder, esp after doing exercises or having been more active around house.  Pt verbalized understanding.       Neuro Re-ed    Neuro Re-ed Details   At counter top: tandem gait x 3 reps with light LUE support, backwards walking x 3 reps with light LUE support.  PT did provide light RUE support intermittently with cues for improved weight shift and wider steps with backwards walking.       Exercises   Exercises  Shoulder;Knee/Hip      Knee/Hip Exercises: Standing   Hip Abduction  Both;1 set;10 reps    Abduction Limitations  Cues for improved glute med activation when standing on LLE      Knee/Hip Exercises: Sidelying   Clams  Pt performed x 5 reps with ease of movement and no difficulty noted, therefore added red band and performed x 10 reps.  Pt reports she is doing this at home, therefore provided red band to use at home.  Pt verbalized understanding.       Shoulder Exercises: Supine   Other Supine Exercises  AAROM with use of wooden dowel, performed B shoulder flex x 10 reps (note painful arc at 30-50 deg of motion in both directions) however pt reports less pain when told to make motion more passive than active.  Also performed R shoulder ER with use of dowel x 10 reps with cues for maintaining R elbow position.  Also reviewed pendulum exercise that she was given at ortho MD however she was unsure of how to perform in standing and allowing body to initiate movement rather than active movement.            Hold cane with both hands. Raise arms overhead, keep elbows straight. _8__ reps per set, _1-2__ sets per day, _5__ days per week   Copyright  VHI. All rights reserved.   SELF ASSISTED WITH OBJECT:  Shoulder External Rotation - Supine    Hold cane with both hands, keep elbow bent and next to body. Rotate arm away from body to the right side. _10__ reps per set, _1-2_ sets per day, __5_ days per week Add towel to keep elbow at side  if needed.   Copyright  VHI. All rights reserved.       PT Education - 08/21/18 1156    Education Details  Added AAROM for R shoulder pain    Person(s) Educated  Patient    Methods  Explanation    Comprehension  Verbalized understanding       PT Short Term Goals - 08/14/18 1204      PT SHORT TERM GOAL #1   Title  patient to be independent with initial HEP     Baseline  08/14/18 Met.    Time  4    Period  Weeks    Status  Achieved      PT SHORT TERM GOAL #2   Title  patient to improve BERG by >/= 4 points demonstrating improved balance    Baseline  08/14/18,  39/56, Met.    Time  4    Period  Weeks    Status  Achieved      PT SHORT TERM GOAL #3   Title  patient to improve TUG by >/= 3 seconds    Baseline  08/14/18, 12.38 seconds with rollator, Met.     Time  4    Period  Weeks    Status  Achieved      PT SHORT TERM GOAL #4   Title  patient to demonstrate gait of >/= 400' with LRAD over level indoor/outdoor surfaces including curbs/inclines with appropriate safety awareness and without LOB    Time  4    Period  Weeks    Status  On-going        PT Long Term Goals - 07/18/18 1542      PT LONG TERM GOAL #1   Title  patient to be independent with advanced HEP    Time  8    Period  Weeks    Status  New    Target Date  09/12/18      PT LONG TERM GOAL #2   Title  patient to improve gait speed to >/= 3.48f/sec wtih LRAD demonstrating improved functional mobility    Time  8    Period  Weeks    Status  New    Target Date  09/12/18      PT LONG TERM GOAL #3   Title  patient to improve BERG by >/= 8 points demonstrating reduced fall risk    Time  8    Period  Weeks    Status  New    Target Date  09/12/18      PT LONG TERM  GOAL #4   Title  patient to demonstrate gait of >/=800' over various indoor and outdoor surfaces without LOB or overt instability with LRAD    Time  8    Period  Weeks    Status  New    Target Date  09/12/18      PT LONG TERM GOAL #5   Title  patient to improve TUG by >/= 7 seconds    Time  8    Period  Weeks    Status  New    Target Date  09/12/18            Plan - 08/21/18 1157    Clinical Impression Statement  Skilled session reviewed new prescription for R shoulder evaluation and treatment. PT to work on getting this scheduled to primary PTs schedule and will add to current POC.  Also reviewed using RW when in community (unless  needing seat) as this is easier for her to load/unload from car and cause less stress on R shoulder.  Continue to address balance deficits during session which seem to stem from L hip weakness.      Rehab Potential  Good    PT Frequency  2x / week    PT Duration  8 weeks    PT Treatment/Interventions  ADLs/Self Care Home Management;Therapeutic exercise;Therapeutic activities;Functional mobility training;Stair training;Gait training;DME Instruction;Balance training;Neuromuscular re-education;Patient/family education;Manual techniques;Orthotic Fit/Training;Taping    PT Next Visit Plan  Please check STG#4.  Gait training with SPC vs loft strand crutches as R shoulder allows, balance with L hip strengthening     PT [place] and [id number] (given10/21/2019 )    Consulted and Agree with Plan of Care  Patient       Patient will benefit from skilled therapeutic intervention in order to improve the following deficits and impairments:  Abnormal gait, Decreased activity tolerance, Decreased balance, Decreased safety awareness, Decreased mobility, Decreased strength, Difficulty walking  Visit Diagnosis: Unsteadiness on feet  Other abnormalities of gait and mobility  Muscle weakness (generalized)     Problem List Patient Active Problem  List   Diagnosis Date Noted  . Abnormality of gait 12/02/2015  . Risk for falls 12/02/2015  . Sciatica neuralgia 06/10/2015  . Scoliosis (and kyphoscoliosis), idiopathic 06/10/2015  . 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 Cervical dysplasia   . IBS (irritable bowel syndrome)   . Macular hole of left eye   . Hypertension   . Pseudocholinesterase deficiency   . Muscular dystrophy ([place])   . CIN (conjunctival intraepithelial neoplasia)   . Osteoporosis   . Atrophic vaginitis    Cameron Sprang, PT, MPT [facility] [address] [place], [place], [address] Phone: [phone]   Fax:  [phone] 08/21/18, 12:01 PM  Name: KAICEE SCARPINO MRN: [id number] Date of Birth: 1930-10-11

## 2018-08-21 NOTE — Patient Instructions (Signed)
SELF ASSISTED WITH OBJECT: Shoulder Flexion - Supine    Hold cane with both hands. Raise arms overhead, keep elbows straight. _8__ reps per set, _1-2__ sets per day, _5__ days per week   Copyright  VHI. All rights reserved.   SELF ASSISTED WITH OBJECT: Shoulder External Rotation - Supine    Hold cane with both hands, keep elbow bent and next to body. Rotate arm away from body to the right side. _10__ reps per set, _1-2_ sets per day, __5_ days per week Add towel to keep elbow at side if needed.   Copyright  VHI. All rights reserved.

## 2018-08-22 ENCOUNTER — Ambulatory Visit: Payer: PPO | Admitting: Rehabilitation

## 2018-08-24 ENCOUNTER — Encounter: Payer: Self-pay | Admitting: Rehabilitation

## 2018-08-24 ENCOUNTER — Ambulatory Visit: Payer: PPO | Admitting: Rehabilitation

## 2018-08-24 DIAGNOSIS — R2681 Unsteadiness on feet: Secondary | ICD-10-CM

## 2018-08-24 DIAGNOSIS — R2689 Other abnormalities of gait and mobility: Secondary | ICD-10-CM

## 2018-08-24 DIAGNOSIS — M6281 Muscle weakness (generalized): Secondary | ICD-10-CM

## 2018-08-24 NOTE — Therapy (Signed)
[facility] [address] [place], [place], [address] Phone: [phone]   Fax:  [phone]  Physical Therapy Treatment  Patient Details  Name: Catherine Harding MRN: [id number] Date of Birth: 06-May-1930 Referring Provider (PT): Dennie Bible, NP   Encounter Date: 08/24/2018  PT End of Session - 08/24/18 0938    Visit Number  8    Number of Visits  17    Date for PT Re-Evaluation  09/12/18    Authorization Type  HTA    PT Start Time  0933    PT Stop Time  1016    PT Time Calculation (min)  43 min    Activity Tolerance  Patient tolerated treatment well    Behavior During Therapy  [facility] for tasks assessed/performed       Past Medical History:  Diagnosis Date  . Arthritis   . Atrophic vaginitis   . Basal cell carcinoma   . Cervical dysplasia   . Complication of anesthesia    pseudocholinesterase deficiency-hard to wake up  . Falls   . GERD (gastroesophageal reflux disease)   . Hypertension   . IBS (irritable bowel syndrome)   . Jaw atrophy   . 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 Macular hole of left eye   . Muscular dystrophy ([place])   . Osteoporosis    pelvic fracture  . Pseudocholinesterase deficiency   . Scoliosis     Past Surgical History:  Procedure Laterality Date  . ABDOMINAL HYSTERECTOMY     TAH BSO  . BREAST LUMPECTOMY  1971   benign  . CESAREAN SECTION     x2  . COLPOSCOPY    . EYE SURGERY  2003   left  . TOTAL ABDOMINAL HYSTERECTOMY W/ BILATERAL SALPINGOOPHORECTOMY  1981  . TRIGGER FINGER RELEASE  07/28/2012   Procedure: RELEASE TRIGGER FINGER/A-1 PULLEY;  Surgeon: Cammie Sickle., MD;  Location: [facility];  Service: Orthopedics;  Laterality: Right;  EXCISION CYST RIGHT LONG A-1 RELEASE A-1 RIGHT LONG     There were no vitals filed for this visit.  Subjective Assessment - 08/24/18 0936    Subjective  Pt reports shoulder is somewhat better  today.   Was able to dry hair a little longer today.     Pertinent History  Limb-girdle MD, HTN, obesity, osteopenia    Patient Stated Goals  "I want to stay out of the wheelchair."    Currently in Pain?  Yes    Pain Score  4     Pain Location  Shoulder    Pain Orientation  Right    Pain Descriptors / Indicators  Aching    Pain Type  Acute pain    Pain Onset  More than a month ago    Pain Frequency  Intermittent    Aggravating Factors   driving, moving arm     Pain Relieving Factors  rest, lying on heating pad                       [facility] Adult PT Treatment/Exercise - 08/24/18 0952      Ambulation/Gait   Ambulation/Gait  Yes    Ambulation/Gait Assistance  5: Supervision;6: Modified independent (Device/Increase time)    Ambulation/Gait Assistance Details  Performed gait in between balance tasks to carryover improved LLE activation and improved heel strike.  Pt does well with cuing, however if gets distracted by conversation she tends to revert back  to Trendelenburg gait and B foot flat.      Ambulation Distance (Feet)  230 Feet   x 2 reps    Assistive device  4-wheeled walker    Gait Pattern  Step-through pattern;Decreased stride length;Decreased hip/knee flexion - right;Decreased hip/knee flexion - left;Trunk flexed    Ambulation Surface  Level;Indoor      Self-Care   Self-Care  Other Self-Care Comments    Other Self-Care Comments   Educated that next PT session would be evaluation for R shoulder rotator cuff tear.  Pt verbalized understanding.       Neuro Re-ed    Neuro Re-ed Details   Using outside of // bars for LUE support (PT providing intermittent R HHA) standing on small rocker board vertically biased maintaining balance x 20 secs with feet apart EO progressing to EC x 2 reps of 20 secs with feet apart.  Standing on foam balance beam with feet apart maintaining balance x 20 secs x 2 reps progressing to stepping forward x 10 rpes and backwards x 10 reps.  Tapping  R LE to 4" step x 10 reps with tactlie and verbal cues for improved glute med activation progressing to RLE cone tap x 10 reps on each side>cone tap out to the side for more flex/abd moment x 10 reps on each side.  Ended session with sit<>stand for more midline posture.  Note that she tends to increase R lateral weight shift, esp when not using UE support, therefore provided cues and light facilitation for increased L lateral weight shift during sit<>stand.             PT Education - 08/24/18 [phone]    Education Details  Education that next session would be shoulder evaluation.     Person(s) Educated  Patient    Methods  Explanation    Comprehension  Verbalized understanding       PT Short Term Goals - 08/14/18 1204      PT SHORT TERM GOAL #1   Title  patient to be independent with initial HEP     Baseline  08/14/18 Met.    Time  4    Period  Weeks    Status  Achieved      PT SHORT TERM GOAL #2   Title  patient to improve BERG by >/= 4 points demonstrating improved balance    Baseline  08/14/18,  39/56, Met.    Time  4    Period  Weeks    Status  Achieved      PT SHORT TERM GOAL #3   Title  patient to improve TUG by >/= 3 seconds    Baseline  08/14/18, 12.38 seconds with rollator, Met.     Time  4    Period  Weeks    Status  Achieved      PT SHORT TERM GOAL #4   Title  patient to demonstrate gait of >/= 400' with LRAD over level indoor/outdoor surfaces including curbs/inclines with appropriate safety awareness and without LOB    Time  4    Period  Weeks    Status  On-going        PT Long Term Goals - 07/18/18 1542      PT LONG TERM GOAL #1   Title  patient to be independent with advanced HEP    Time  8    Period  Weeks    Status  New    Target Date  09/12/18  PT LONG TERM GOAL #2   Title  patient to improve gait speed to >/= 3.70f/sec wtih LRAD demonstrating improved functional mobility    Time  8    Period  Weeks    Status  New    Target Date  09/12/18       PT LONG TERM GOAL #3   Title  patient to improve BERG by >/= 8 points demonstrating reduced fall risk    Time  8    Period  Weeks    Status  New    Target Date  09/12/18      PT LONG TERM GOAL #4   Title  patient to demonstrate gait of >/=800' over various indoor and outdoor surfaces without LOB or overt instability with LRAD    Time  8    Period  Weeks    Status  New    Target Date  09/12/18      PT LONG TERM GOAL #5   Title  patient to improve TUG by >/= 7 seconds    Time  8    Period  Weeks    Status  New    Target Date  09/12/18            Plan - 08/24/18 1206    Clinical Impression Statement  Skilled session focused on high level balance with elicitation of ankle, hip and stepping strategy on both solid and compliant surfaces.  Pt mostly limited due to L hip weakness, but is able to improve activation with cued and she increases attention to L LE activation.     Rehab Potential  Good    PT Frequency  2x / week    PT Duration  8 weeks    PT Treatment/Interventions  ADLs/Self Care Home Management;Therapeutic exercise;Therapeutic activities;Functional mobility training;Stair training;Gait training;DME Instruction;Balance training;Neuromuscular re-education;Patient/family education;Manual techniques;Orthotic Fit/Training;Taping    PT Next Visit Plan  Please check STG#4.  Gait training with SPC vs loft strand crutches as R shoulder allows, balance with L hip strengthening     PT H[place] [id number](given10/21/2019 )    Consulted and Agree with Plan of Care  Patient       Patient will benefit from skilled therapeutic intervention in order to improve the following deficits and impairments:  Abnormal gait, Decreased activity tolerance, Decreased balance, Decreased safety awareness, Decreased mobility, Decreased strength, Difficulty walking  Visit Diagnosis: Unsteadiness on feet  Muscle weakness (generalized)  Other abnormalities of gait and  mobility     Problem List Patient Active Problem List   Diagnosis Date Noted  . Abnormality of gait 12/02/2015  . Risk for falls 12/02/2015  . Sciatica neuralgia 06/10/2015  . Scoliosis (and kyphoscoliosis), idiopathic 06/10/2015  . Limb-girdle muscular dystrophy ([place] 07/05/2013  . Cervical dysplasia   . IBS (irritable bowel syndrome)   . Macular hole of left eye   . Hypertension   . Pseudocholinesterase deficiency   . [place]cular [place]rophy ([place]   . CIN (conjunctival intraepithelial neoplasia)   . Osteoporosis   . Atrophic vaginitis     ECameron Sprang PT, MPT [facility] [address]Phone: [phone]  Fax:  [phone]/14/19, 12:09 PM  Name: BMARABELLE CUSHMANMRN: [id number] of Birth: [birth year]

## 2018-08-28 ENCOUNTER — Encounter

## 2018-08-29 ENCOUNTER — Encounter: Payer: Self-pay | Admitting: Physical Therapy

## 2018-08-29 ENCOUNTER — Ambulatory Visit: Payer: PPO | Admitting: Physical Therapy

## 2018-08-29 DIAGNOSIS — Z23 Encounter for immunization: Secondary | ICD-10-CM | POA: Diagnosis not present

## 2018-08-29 DIAGNOSIS — M6281 Muscle weakness (generalized): Secondary | ICD-10-CM

## 2018-08-29 DIAGNOSIS — M9901 Segmental and somatic dysfunction of cervical region: Secondary | ICD-10-CM | POA: Diagnosis not present

## 2018-08-29 DIAGNOSIS — M25511 Pain in right shoulder: Secondary | ICD-10-CM

## 2018-08-29 DIAGNOSIS — M50322 Other cervical disc degeneration at C5-C6 level: Secondary | ICD-10-CM | POA: Diagnosis not present

## 2018-08-29 DIAGNOSIS — R2681 Unsteadiness on feet: Secondary | ICD-10-CM | POA: Diagnosis not present

## 2018-08-29 DIAGNOSIS — R293 Abnormal posture: Secondary | ICD-10-CM

## 2018-08-29 DIAGNOSIS — M5134 Other intervertebral disc degeneration, thoracic region: Secondary | ICD-10-CM | POA: Diagnosis not present

## 2018-08-29 DIAGNOSIS — R2689 Other abnormalities of gait and mobility: Secondary | ICD-10-CM

## 2018-08-29 DIAGNOSIS — M9902 Segmental and somatic dysfunction of thoracic region: Secondary | ICD-10-CM | POA: Diagnosis not present

## 2018-08-29 NOTE — Patient Instructions (Signed)
Flexion (Assistive)   Clasp hands together and raise arms above head, keeping elbows as straight as possible. Can be done sitting or lying. Repeat __10__ times. Do __2__ sessions per day.  External Rotation (Eccentric), Active-Assist - Supine (Cane)   Lie on back, affected arm out from side, elbow at 90, forearm forward. Use cane to assist in lifting forearm of affected arm to neutral. Slowly lower for 3-5 seconds. _10__ reps per set, _2__ sets per day Have towel rolled under elbow  Cane Exercise: Abduction   Hold cane with right hand over end, palm-up, with other hand palm-down. Move arm out from side and up by pushing with other arm. Hold __3__ seconds. Repeat __10__ times. Do __2__ sessions per day.  Strengthening: Isometric Flexion  Using wall for resistance, press right fist into ball using light pressure. Hold __5__ seconds. Repeat _10___ times per set.   SHOULDER: Abduction (Isometric)  Use wall as resistance. Press arm against pillow. Keep elbow straight. Hold _5__ seconds. _10__ reps per set  Extension (Isometric)  Place left bent elbow and back of arm against wall. Press elbow against wall. Hold __10__ seconds. Repeat __5__ times.   Internal Rotation (Isometric)  Place palm of right fist against door frame, with elbow bent. Press fist against door frame. Hold __5__ seconds. Repeat _10___ times.  External Rotation (Isometric)  Place back of left fist against door frame, with elbow bent. Press fist against door frame. Hold __5__ seconds. Repeat __10__ times.

## 2018-08-29 NOTE — Therapy (Signed)
[facility] [address] [place], [place], [address] Phone: [phone]   Fax:  [phone]  Physical Therapy Treatment  Patient Details  Name: Catherine Harding MRN: [id number] Date of Birth: Jun 26, 1930 Referring Provider (PT): Dennie Bible, NP   Encounter Date: 08/29/2018  PT End of Session - 08/29/18 1020    Visit Number  8    Number of Visits  17    Date for PT Re-Evaluation  09/12/18    Authorization Type  HTA    PT Start Time  1018    PT Stop Time  1059    PT Time Calculation (min)  41 min    Activity Tolerance  Patient tolerated treatment well    Behavior During Therapy  [facility] for tasks assessed/performed       Past Medical History:  Diagnosis Date  . Arthritis   . Atrophic vaginitis   . Basal cell carcinoma   . Cervical dysplasia   . Complication of anesthesia    pseudocholinesterase deficiency-hard to wake up  . Falls   . GERD (gastroesophageal reflux disease)   . Hypertension   . IBS (irritable bowel syndrome)   . Jaw atrophy   . 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 Macular hole of left eye   . Muscular dystrophy ([place])   . Osteoporosis    pelvic fracture  . Pseudocholinesterase deficiency   . Scoliosis     Past Surgical History:  Procedure Laterality Date  . ABDOMINAL HYSTERECTOMY     TAH BSO  . BREAST LUMPECTOMY  1971   benign  . CESAREAN SECTION     x2  . COLPOSCOPY    . EYE SURGERY  2003   left  . TOTAL ABDOMINAL HYSTERECTOMY W/ BILATERAL SALPINGOOPHORECTOMY  1981  . TRIGGER FINGER RELEASE  07/28/2012   Procedure: RELEASE TRIGGER FINGER/A-1 PULLEY;  Surgeon: Cammie Sickle., MD;  Location: [facility];  Service: Orthopedics;  Laterality: Right;  EXCISION CYST RIGHT LONG A-1 RELEASE A-1 RIGHT LONG     There were no vitals filed for this visit.  Subjective Assessment - 08/29/18 1018    Subjective  Had an cortisone injection last week -  feeling some relief with this.     Pertinent History  Limb-girdle MD, HTN, obesity, osteopenia    Patient Stated Goals  "I want to stay out of the wheelchair."    Currently in Pain?  No/denies    Pain Score  0-No pain         [facility] PT Assessment - 08/29/18 0001      ROM / Strength   AROM / PROM / Strength  AROM;PROM;Strength      AROM   AROM Assessment Site  Shoulder    Right/Left Shoulder  Right    Right Shoulder Flexion  58 Degrees    Right Shoulder ABduction  70 Degrees    Right Shoulder Internal Rotation  --   FIR to ~L3   Right Shoulder External Rotation  --   FER to ~ C6     PROM   Overall PROM Comments  pain at end ranges    PROM Assessment Site  Shoulder    Right/Left Shoulder  Right    Right Shoulder Flexion  160 Degrees    Right Shoulder ABduction  156 Degrees    Right Shoulder Internal Rotation  --   full   Right Shoulder External Rotation  --   full  [facility] Adult PT Treatment/Exercise - 08/29/18 0001      Exercises   Exercises  Shoulder      Shoulder Exercises: Supine   External Rotation  AAROM;Right;10 reps    External Rotation Limitations  cueing for form; elbow elevated on towel    Flexion  AAROM;Right;10 reps    Flexion Limitations  hands clasped together      Shoulder Exercises: Seated   Abduction  AAROM;Right;10 reps    ABduction Limitations  linmited motion - painful       Shoulder Exercises: Isometric Strengthening   Flexion  5X5"    Flexion Limitations  50% effort - cueing for form    Extension  5X5"    Extension Limitations  50% effort - cueing for form    External Rotation  5X5"    External Rotation Limitations  50% effort - cueing for form    Internal Rotation  5X5"    Internal Rotation Limitations  50% effort - cueing for form    ABduction  5X5"    ABduction Limitations  50% effort - cueing for form             PT Education - 08/29/18 1213    Education Details  shoulder exam findings and HEP  for shoulder AAROM and gentle muscle activation    Person(s) Educated  Patient    Methods  Explanation;Demonstration;Handout    Comprehension  Verbalized understanding       PT Short Term Goals - 08/14/18 1204      PT SHORT TERM GOAL #1   Title  patient to be independent with initial HEP     Baseline  08/14/18 Met.    Time  4    Period  Weeks    Status  Achieved      PT SHORT TERM GOAL #2   Title  patient to improve BERG by >/= 4 points demonstrating improved balance    Baseline  08/14/18,  39/56, Met.    Time  4    Period  Weeks    Status  Achieved      PT SHORT TERM GOAL #3   Title  patient to improve TUG by >/= 3 seconds    Baseline  08/14/18, 12.38 seconds with rollator, Met.     Time  4    Period  Weeks    Status  Achieved      PT SHORT TERM GOAL #4   Title  patient to demonstrate gait of >/= 400' with LRAD over level indoor/outdoor surfaces including curbs/inclines with appropriate safety awareness and without LOB    Time  4    Period  Weeks    Status  On-going        PT Long Term Goals - 08/29/18 1219      PT LONG TERM GOAL #1   Title  patient to be independent with advanced HEP    Time  8    Period  Weeks    Status  New      PT LONG TERM GOAL #2   Title  patient to improve gait speed to >/= 3.33f/sec wtih LRAD demonstrating improved functional mobility    Time  8    Period  Weeks    Status  New      PT LONG TERM GOAL #3   Title  patient to improve BERG by >/= 8 points demonstrating reduced fall risk    Time  8    Period  Weeks    Status  New      PT LONG TERM GOAL #4   Title  patient to demonstrate gait of >/=800' over various indoor and outdoor surfaces without LOB or overt instability with LRAD    Time  8    Period  Weeks    Status  New      PT LONG TERM GOAL #5   Title  patient to improve TUG by >/= 7 seconds    Time  8    Period  Weeks    Status  New      Additional Long Term Goals   Additional Long Term Goals  Yes      PT LONG TERM  GOAL #6   Title  patient to demonstrate improved AROM at L shoulder flexion and abduction by >/= 10 degrees needed for improved function    Status  New    Target Date  09/12/18      PT LONG TERM GOAL #7   Title  patient to report pain reduction by >/= 50% at R shoulder for greater than 2 weeks    Status  New    Target Date  09/12/18      PT LONG TERM GOAL #8   Title  patient to demonstrate improved function at R shoulder (putting on coat, reaching, light lifting) feeded for ADLs and home tasks    Status  New    Target Date  09/12/18            Plan - 08/29/18 1221    Clinical Impression Statement  Patient with new referral due to R shoulder RTC pain/tear. Patient today with limited AROM and strength at R shoulder, as expected. Some improvements in pain since cortisone injection by MD last week. Full PROM with pain only at end ranges. Patient today given HEP for gentle AAROM and isometrics to maintain mobility and begin light strengthening. Does require consistent verbal cueing for form and function at R shoulder with limited carryover - will need to review HEP at next visit. Goals updated to reflect current functional status/limitations at R shoulder.     Rehab Potential  Good    PT Frequency  2x / week    PT Duration  8 weeks    PT Treatment/Interventions  ADLs/Self Care Home Management;Therapeutic exercise;Therapeutic activities;Functional mobility training;Stair training;Gait training;DME Instruction;Balance training;Neuromuscular re-education;Patient/family education;Manual techniques;Orthotic Fit/Training;Taping;Cryotherapy;Electrical Stimulation;Moist Heat;Iontophoresis 74m/ml Dexamethasone;Passive range of motion;Dry needling    PT Next Visit Plan  Please check STG#4.  Gait training with SPC vs loft strand crutches as R shoulder allows, balance with L hip strengthening; HEP for shoulder - review and update.      PT Home Exercise Plan  B[facility] LLCand [id number](given10/21/2019 )     Consulted and Agree with Plan of Care  Patient       Patient will benefit from skilled therapeutic intervention in order to improve the following deficits and impairments:  Abnormal gait, Decreased activity tolerance, Decreased balance, Decreased safety awareness, Decreased mobility, Decreased strength, Difficulty walking, Decreased range of motion, Pain  Visit Diagnosis: Unsteadiness on feet  Muscle weakness (generalized)  Other abnormalities of gait and mobility  Acute pain of right shoulder  Abnormal posture     Problem List Patient Active Problem List   Diagnosis Date Noted  . Abnormality of gait 12/02/2015  . Risk for falls 12/02/2015  . Sciatica neuralgia 06/10/2015  . Scoliosis (and kyphoscoliosis), idiopathic 06/10/2015  . [place]le [place]rophy ([place] 07/05/2013  .  Cervical dysplasia   . IBS (irritable bowel syndrome)   . Macular hole of left eye   . Hypertension   . Pseudocholinesterase deficiency   . Muscular dystrophy ([place])   . CIN (conjunctival intraepithelial neoplasia)   . Osteoporosis   . Atrophic vaginitis     Lanney Gins, PT, DPT Supplemental Physical Therapist 08/29/18 12:26 PM Pager: [phone] Office: [place] [facility] [address] [place], [place], [address] Phone: [phone]   Fax:  [phone]  Name: Catherine Harding MRN: [id number] Date of Birth: 25-Nov-1929

## 2018-08-31 ENCOUNTER — Ambulatory Visit: Payer: PPO | Admitting: Physical Therapy

## 2018-08-31 DIAGNOSIS — R2681 Unsteadiness on feet: Secondary | ICD-10-CM | POA: Diagnosis not present

## 2018-08-31 DIAGNOSIS — M6281 Muscle weakness (generalized): Secondary | ICD-10-CM

## 2018-08-31 NOTE — Therapy (Signed)
[facility] [address] [place], [place], [address] Phone: [phone]   Fax:  [phone]  Physical Therapy Treatment  Patient Details  Name: Catherine Harding MRN: [id number] Date of Birth: 17-Nov-1929 Referring Provider (PT): Dennie Bible, NP   Encounter Date: 08/31/2018  PT End of Session - 08/31/18 1253    Visit Number  9    Number of Visits  17    Date for PT Re-Evaluation  09/12/18    Authorization Type  HTA    PT Start Time  1015    PT Stop Time  1100    PT Time Calculation (min)  45 min    Equipment Utilized During Treatment  Gait belt    Activity Tolerance  Patient tolerated treatment well    Behavior During Therapy  [facility] for tasks assessed/performed       Past Medical History:  Diagnosis Date  . Arthritis   . Atrophic vaginitis   . Basal cell carcinoma   . Cervical dysplasia   . Complication of anesthesia    pseudocholinesterase deficiency-hard to wake up  . Falls   . GERD (gastroesophageal reflux disease)   . Hypertension   . IBS (irritable bowel syndrome)   . Jaw atrophy   . 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 Macular hole of left eye   . Muscular dystrophy ([place])   . Osteoporosis    pelvic fracture  . Pseudocholinesterase deficiency   . Scoliosis     Past Surgical History:  Procedure Laterality Date  . ABDOMINAL HYSTERECTOMY     TAH BSO  . BREAST LUMPECTOMY  1971   benign  . CESAREAN SECTION     x2  . COLPOSCOPY    . EYE SURGERY  2003   left  . TOTAL ABDOMINAL HYSTERECTOMY W/ BILATERAL SALPINGOOPHORECTOMY  1981  . TRIGGER FINGER RELEASE  07/28/2012   Procedure: RELEASE TRIGGER FINGER/A-1 PULLEY;  Surgeon: Cammie Sickle., MD;  Location: [facility];  Service: Orthopedics;  Laterality: Right;  EXCISION CYST RIGHT LONG A-1 RELEASE A-1 RIGHT LONG     There were no vitals filed for this visit.  Subjective Assessment - 08/31/18 1020    Subjective  Pt states that the cold weather makes shoulder hurt but it is doing a little better. She is now able to raise her hair dryer to her hair now. She has not been able to do isometric shoulder exercises on HEP due to LGMD exacerbation but she states the other ones are going okay.     Pertinent History  Limb-girdle MD, HTN, obesity, osteopenia    Patient Stated Goals  "I want to stay out of the wheelchair."    Currently in Pain?  Yes    Pain Score  6     Pain Location  Shoulder    Pain Orientation  Right    Pain Descriptors / Indicators  Aching    Pain Frequency  Constant    Aggravating Factors   reaching up     Pain Relieving Factors  lying on heating pad               [facility] Adult PT Treatment/Exercise - 08/31/18 1242      Ambulation/Gait   Ambulation/Gait  Yes    Ambulation/Gait Assistance  5: Supervision    Ambulation Distance (Feet)  400 Feet    Assistive device  Rollator    Gait Pattern  Step-through pattern;Decreased stride length;Decreased hip/knee  flexion - right;Decreased hip/knee flexion - left;Trunk flexed    Ambulation Surface  Unlevel;Outdoor;Paved      High Level Balance   High Level Balance Comments  At counter: Foward marching, backward walking, side stepping with UE support on counter. verbal cues to increase step length and keep toes pointed forward with side stepping. Negotiating over objects forward and sideways with UE support, tandem walking with UE support x3 laps. In corner: feet together on pillows no UE support. x30 sec 2 reps.       Exercises   Exercises  Knee/Hip    Other Exercises   3 way hip at counter. Verbal cues to keep toes pointed forward with hip abduction and to avoid forward and lateral lean; mini squats x10 reps with verbal cues for proper exercise form.                 PT Short Term Goals - 08/31/18 1302      PT SHORT TERM GOAL #1   Title  patient to be independent with initial HEP     Baseline  08/14/18 Met.    Time  4     Period  Weeks    Status  Achieved      PT SHORT TERM GOAL #2   Title  patient to improve BERG by >/= 4 points demonstrating improved balance    Baseline  08/14/18,  39/56, Met.    Time  4    Period  Weeks    Status  Achieved      PT SHORT TERM GOAL #3   Title  patient to improve TUG by >/= 3 seconds    Baseline  08/14/18, 12.38 seconds with rollator, Met.     Time  4    Period  Weeks    Status  Achieved      PT SHORT TERM GOAL #4   Title  patient to demonstrate gait of >/= 400' with LRAD over level indoor/outdoor surfaces including curbs/inclines with appropriate safety awareness and without LOB    Baseline  08/31/18: met today with rollator.     Time  4    Period  Weeks    Status  Achieved        PT Long Term Goals - 08/31/18 1530      PT LONG TERM GOAL #1   Title  patient to be independent with advanced HEP    Time  8    Period  Weeks    Status  New    Target Date  09/12/18      PT LONG TERM GOAL #2   Title  patient to improve gait speed to >/= 3.83f/sec wtih LRAD demonstrating improved functional mobility    Time  8    Period  Weeks    Status  New      PT LONG TERM GOAL #3   Title  patient to improve BERG by >/= 8 points demonstrating reduced fall risk    Time  8    Period  Weeks    Status  New      PT LONG TERM GOAL #4   Title  patient to demonstrate gait of >/=800' over various indoor and outdoor surfaces without LOB or overt instability with LRAD    Time  8    Period  Weeks    Status  New      PT LONG TERM GOAL #5   Title  patient to improve TUG by >/= 7  seconds    Time  8    Period  Weeks    Status  New      PT LONG TERM GOAL #6   Title  patient to demonstrate improved AROM at L shoulder flexion and abduction by >/= 10 degrees needed for improved function    Status  New      PT LONG TERM GOAL #7   Title  patient to report pain reduction by >/= 50% at R shoulder for greater than 2 weeks    Status  New      PT LONG TERM GOAL #8   Title   patient to demonstrate improved function at R shoulder (putting on coat, reaching, light lifting) feeded for ADLs and home tasks    Status  New            Plan - 08/31/18 1254    Clinical Impression Statement  Today's session focused on assessing remaining STG for outdoor gait with pt meeting as well as LE balance and strengthing emphasizing hip muscles. Pt able to tolerate advanced balance/strength exercises without complication requiring UE suppport and min guard. Pt would benefit from further PT to continue progressing toward established goals.     Rehab Potential  Good    PT Frequency  2x / week    PT Duration  8 weeks    PT Treatment/Interventions  ADLs/Self Care Home Management;Therapeutic exercise;Therapeutic activities;Functional mobility training;Stair training;Gait training;DME Instruction;Balance training;Neuromuscular re-education;Patient/family education;Manual techniques;Orthotic Fit/Training;Taping;Cryotherapy;Electrical Stimulation;Moist Heat;Iontophoresis 52m/ml Dexamethasone;Passive range of motion;Dry needling    PT Next Visit Plan  Gait training with SPC vs loft strand crutches as R shoulder allows, balance with L hip strengthening; HEP for shoulder - review and update.      PT Home Exercise Plan  [facility] [id number](given10/21/2019 )    Consulted and Agree with Plan of Care  Patient       Patient will benefit from skilled therapeutic intervention in order to improve the following deficits and impairments:  Abnormal gait, Decreased activity tolerance, Decreased balance, Decreased safety awareness, Decreased mobility, Decreased strength, Difficulty walking, Decreased range of motion, Pain  Visit Diagnosis: Unsteadiness on feet  Muscle weakness (generalized)     Problem List Patient Active Problem List   Diagnosis Date Noted  . Abnormality of gait 12/02/2015  . Risk for falls 12/02/2015  . Sciatica neuralgia 06/10/2015  . Scoliosis (and kyphoscoliosis),  idiopathic 06/10/2015  . [place] 07/05[place]2013  . Cervical dysplasia   . IBS (irritable bowel syndrome)   . Macular hole of left eye   . Hypertension   . Pseudocholinesterase deficiency   . [place]cular [place]   . CIN (conjunctival intraepithelial neoplasia)   . Osteoporosis   . Atrophic vaginitis     BCecile Sheerer SPTA 08/31/2018, 3:30 PM  [id number] [address]Phone: [phone]  Fax:  [phone] Name: BNOELANI HARBACHMRN: [id number] of Birth: 104/23/1931

## 2018-09-04 ENCOUNTER — Ambulatory Visit: Payer: PPO | Admitting: Physical Therapy

## 2018-09-04 ENCOUNTER — Encounter: Payer: Self-pay | Admitting: Physical Therapy

## 2018-09-04 DIAGNOSIS — R2681 Unsteadiness on feet: Secondary | ICD-10-CM | POA: Diagnosis not present

## 2018-09-04 DIAGNOSIS — R2689 Other abnormalities of gait and mobility: Secondary | ICD-10-CM

## 2018-09-04 DIAGNOSIS — M6281 Muscle weakness (generalized): Secondary | ICD-10-CM

## 2018-09-04 NOTE — Therapy (Signed)
[facility] [address] [place], [place], [address] Phone: [phone]   Fax:  [phone]  Physical Therapy Treatment  Patient Details  Name: Catherine Harding MRN: [id number] Date of Birth: 1930-07-23 Referring Provider (PT): Dennie Bible, NP   Encounter Date: 09/04/2018  PT End of Session - 09/04/18 1301    Visit Number  10    Number of Visits  17    Date for PT Re-Evaluation  09/12/18    Authorization Type  HTA    PT Start Time  1015    PT Stop Time  1105    PT Time Calculation (min)  50 min    Equipment Utilized During Treatment  Gait belt    Activity Tolerance  Patient tolerated treatment well    Behavior During Therapy  [facility] for tasks assessed/performed       Past Medical History:  Diagnosis Date  . Arthritis   . Atrophic vaginitis   . Basal cell carcinoma   . Cervical dysplasia   . Complication of anesthesia    pseudocholinesterase deficiency-hard to wake up  . Falls   . GERD (gastroesophageal reflux disease)   . Hypertension   . IBS (irritable bowel syndrome)   . Jaw atrophy   . 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 Macular hole of left eye   . Muscular dystrophy ([place])   . Osteoporosis    pelvic fracture  . Pseudocholinesterase deficiency   . Scoliosis     Past Surgical History:  Procedure Laterality Date  . ABDOMINAL HYSTERECTOMY     TAH BSO  . BREAST LUMPECTOMY  1971   benign  . CESAREAN SECTION     x2  . COLPOSCOPY    . EYE SURGERY  2003   left  . TOTAL ABDOMINAL HYSTERECTOMY W/ BILATERAL SALPINGOOPHORECTOMY  1981  . TRIGGER FINGER RELEASE  07/28/2012   Procedure: RELEASE TRIGGER FINGER/A-1 PULLEY;  Surgeon: Cammie Sickle., MD;  Location: [facility];  Service: Orthopedics;  Laterality: Right;  EXCISION CYST RIGHT LONG A-1 RELEASE A-1 RIGHT LONG     There were no vitals filed for this visit.  Subjective Assessment - 09/04/18 1019    Subjective  Pt has some questions about shoulder exercises. Pt continues to have trouble with carry things with 2 hands and walking.    Pertinent History  Limb-girdle MD, HTN, obesity, osteopenia    Patient Stated Goals  "I want to stay out of the wheelchair."    Currently in Pain?  Yes    Pain Score  4     Pain Location  Shoulder    Pain Orientation  Right    Pain Descriptors / Indicators  Aching    Pain Type  Acute pain    Pain Onset  More than a month ago    Pain Frequency  Constant      Performed during session with min to mod cues for mechanics and posture.   Flexion (Assistive)   Clasp hands together and raise arms above head, keeping elbows as straight as possible. Can be done sitting or lying. Repeat __10__ times. Do __2__ sessions per day.  External Rotation (Eccentric), Active-Assist - Supine (Cane)   Lie on back, affected arm out from side, elbow at 90, forearm forward. Use cane to assist in lifting forearm of affected arm to neutral. Slowly lower for 3-5 seconds. _10__ reps per set, _2__ sets per day Have towel rolled under elbow  Cane Exercise: Abduction   Hold cane with right hand over end, palm-up, with other hand palm-down. Move arm out from side and up by pushing with other arm. Hold __3__ seconds. Repeat __10__ times. Do __2__ sessions per day.  Strengthening: Isometric Flexion  Using wall for resistance, press right fist into ball using light pressure. Hold __5__ seconds. Repeat _10___ times per set.   SHOULDER: Abduction (Isometric)  Use wall as resistance. Press arm against pillow. Keep elbow straight. Hold _5__ seconds. _10__ reps per set  Extension (Isometric)  Place left bent elbow and back of arm against wall. Press elbow against wall. Hold __10__ seconds. Repeat __5__ times.   Internal Rotation (Isometric)  Place palm of right fist against door frame, with elbow bent. Press fist against door frame. Hold __5__ seconds. Repeat _10___  times.  External Rotation (Isometric)  Place back of left fist against door frame, with elbow bent. Press fist against door frame. Hold __5__ seconds. Repeat __10__ times.                           PT Education - 09/04/18 1259    Education Details  Reviewed/ performed HEP for shoulder AAROM and gentle muscle activation.  Discussed safety for household gait and carrying heavy objects, recommend pt use rollator seat to carry objects that require 2 hand support.    Person(s) Educated  Patient    Methods  Explanation;Demonstration;Verbal cues;Handout    Comprehension  Verbalized understanding;Returned demonstration;Verbal cues required;Tactile cues required;Need further instruction       PT Short Term Goals - 08/31/18 1302      PT SHORT TERM GOAL #1   Title  patient to be independent with initial HEP     Baseline  08/14/18 Met.    Time  4    Period  Weeks    Status  Achieved      PT SHORT TERM GOAL #2   Title  patient to improve BERG by >/= 4 points demonstrating improved balance    Baseline  08/14/18,  39/56, Met.    Time  4    Period  Weeks    Status  Achieved      PT SHORT TERM GOAL #3   Title  patient to improve TUG by >/= 3 seconds    Baseline  08/14/18, 12.38 seconds with rollator, Met.     Time  4    Period  Weeks    Status  Achieved      PT SHORT TERM GOAL #4   Title  patient to demonstrate gait of >/= 400' with LRAD over level indoor/outdoor surfaces including curbs/inclines with appropriate safety awareness and without LOB    Baseline  08/31/18: met today with rollator.     Time  4    Period  Weeks    Status  Achieved        PT Long Term Goals - 08/31/18 1530      PT LONG TERM GOAL #1   Title  patient to be independent with advanced HEP    Time  8    Period  Weeks    Status  New    Target Date  09/12/18      PT LONG TERM GOAL #2   Title  patient to improve gait speed to >/= 3.52f/sec wtih LRAD demonstrating improved functional  mobility    Time  8    Period  Weeks  Status  New      PT LONG TERM GOAL #3   Title  patient to improve BERG by >/= 8 points demonstrating reduced fall risk    Time  8    Period  Weeks    Status  New      PT LONG TERM GOAL #4   Title  patient to demonstrate gait of >/=800' over various indoor and outdoor surfaces without LOB or overt instability with LRAD    Time  8    Period  Weeks    Status  New      PT LONG TERM GOAL #5   Title  patient to improve TUG by >/= 7 seconds    Time  8    Period  Weeks    Status  New      PT LONG TERM GOAL #6   Title  patient to demonstrate improved AROM at L shoulder flexion and abduction by >/= 10 degrees needed for improved function    Status  New      PT LONG TERM GOAL #7   Title  patient to report pain reduction by >/= 50% at R shoulder for greater than 2 weeks    Status  New      PT LONG TERM GOAL #8   Title  patient to demonstrate improved function at R shoulder (putting on coat, reaching, light lifting) feeded for ADLs and home tasks    Status  New            Plan - 09/04/18 1302    Clinical Impression Statement  Skilled session focused on reviewing and performing HEP for right shoulder AAROM and gentle muscle activation; pt able to perform all isometric exercises without  pain. Pt reported some "grabbing" type pain with AAROM with flexon and ABduction; instructed pt to perform AAROM in pain free range, progressing slowly; pt able to perform with less grabbing sensation.                                                                            Rehab Potential  Good    PT Frequency  2x / week    PT Duration  8 weeks    PT Treatment/Interventions  ADLs/Self Care Home Management;Therapeutic exercise;Therapeutic activities;Functional mobility training;Stair training;Gait training;DME Instruction;Balance training;Neuromuscular re-education;Patient/family education;Manual techniques;Orthotic Fit/Training;Taping;Cryotherapy;Electrical  Stimulation;Moist Heat;Iontophoresis 43m/ml Dexamethasone;Passive range of motion;Dry needling    PT Next Visit Plan  , balance with L hip strengthening; HEP for shoulder (continue to review pt questions) - review and update.      PT Home Exercise Plan  [facility] Systemand [id number](given10/21/2019 )    Consulted and Agree with Plan of Care  Patient       Patient will benefit from skilled therapeutic intervention in order to improve the following deficits and impairments:  Decreased activity tolerance, Decreased balance, Decreased safety awareness, Decreased mobility, Decreased strength, Difficulty walking, Decreased range of motion, Pain, Abnormal gait  Visit Diagnosis: Unsteadiness on feet  Muscle weakness (generalized)  Other abnormalities of gait and mobility     Problem List Patient Active Problem List   Diagnosis Date Noted  . Abnormality of gait 12/02/2015  . Risk for falls 12/02/2015  .  Sciatica neuralgia 06/10/2015  . Scoliosis (and kyphoscoliosis), idiopathic 06/10/2015  . 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 Cervical dysplasia   . IBS (irritable bowel syndrome)   . Macular hole of left eye   . Hypertension   . Pseudocholinesterase deficiency   . Muscular dystrophy ([place])   . CIN (conjunctival intraepithelial neoplasia)   . Osteoporosis   . Atrophic vaginitis     Bjorn Loser, PTA  09/04/18, 1:16 PM [facility] [address] [place], [place], [address] Phone: [phone]   Fax:  [phone]  Name: Catherine Harding MRN: [id number] Date of Birth: [birth year]

## 2018-09-04 NOTE — Patient Instructions (Signed)
Flexion (Assistive)   Clasp hands together and raise arms above head, keeping elbows as straight as possible. Can be done sitting or lying. Repeat __10__ times. Do __2__ sessions per day.  External Rotation (Eccentric), Active-Assist - Supine (Cane)   Lie on back, affected arm out from side, elbow at 90, forearm forward. Use cane to assist in lifting forearm of affected arm to neutral. Slowly lower for 3-5 seconds. _10__ reps per set, _2__ sets per day Have towel rolled under elbow  Cane Exercise: Abduction   Hold cane with right hand over end, palm-up, with other hand palm-down. Move arm out from side and up by pushing with other arm. Hold __3__ seconds. Repeat __10__ times. Do __2__ sessions per day.  Strengthening: Isometric Flexion  Using wall for resistance, press right fist into ball using light pressure. Hold __5__ seconds. Repeat _10___ times per set.   SHOULDER: Abduction (Isometric)  Use wall as resistance. Press arm against pillow. Keep elbow straight. Hold _5__ seconds. _10__ reps per set  Extension (Isometric)  Place left bent elbow and back of arm against wall. Press elbow against wall. Hold __10__ seconds. Repeat __5__ times.   Internal Rotation (Isometric)  Place palm of right fist against door frame, with elbow bent. Press fist against door frame. Hold __5__ seconds. Repeat _10___ times.  External Rotation (Isometric)  Place back of left fist against door frame, with elbow bent. Press fist against door frame. Hold __5__ seconds. Repeat __10__ times.

## 2018-09-06 ENCOUNTER — Ambulatory Visit: Payer: PPO | Admitting: Physical Therapy

## 2018-09-06 ENCOUNTER — Encounter: Payer: Self-pay | Admitting: Physical Therapy

## 2018-09-06 DIAGNOSIS — R293 Abnormal posture: Secondary | ICD-10-CM

## 2018-09-06 DIAGNOSIS — R2681 Unsteadiness on feet: Secondary | ICD-10-CM | POA: Diagnosis not present

## 2018-09-06 DIAGNOSIS — M6281 Muscle weakness (generalized): Secondary | ICD-10-CM

## 2018-09-06 DIAGNOSIS — M25511 Pain in right shoulder: Secondary | ICD-10-CM

## 2018-09-06 DIAGNOSIS — R2689 Other abnormalities of gait and mobility: Secondary | ICD-10-CM

## 2018-09-06 NOTE — Therapy (Signed)
[facility] [address] [place], [place], [address] Phone: [phone]   Fax:  [phone]  Physical Therapy Treatment  Patient Details  Name: Catherine Harding MRN: [id number] Date of Birth: 06-05-1930 Referring Provider (PT): Dennie Bible, NP   Encounter Date: 09/06/2018  PT End of Session - 09/06/18 1012    Visit Number  11    Number of Visits  17    Date for PT Re-Evaluation  09/12/18    Authorization Type  HTA    PT Start Time  1009    PT Stop Time  1056    PT Time Calculation (min)  47 min    Activity Tolerance  Patient tolerated treatment well    Behavior During Therapy  [facility] for tasks assessed/performed       Past Medical History:  Diagnosis Date  . Arthritis   . Atrophic vaginitis   . Basal cell carcinoma   . Cervical dysplasia   . Complication of anesthesia    pseudocholinesterase deficiency-hard to wake up  . Falls   . GERD (gastroesophageal reflux disease)   . Hypertension   . IBS (irritable bowel syndrome)   . Jaw atrophy   . 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 Macular hole of left eye   . Muscular dystrophy ([place])   . Osteoporosis    pelvic fracture  . Pseudocholinesterase deficiency   . Scoliosis     Past Surgical History:  Procedure Laterality Date  . ABDOMINAL HYSTERECTOMY     TAH BSO  . BREAST LUMPECTOMY  1971   benign  . CESAREAN SECTION     x2  . COLPOSCOPY    . EYE SURGERY  2003   left  . TOTAL ABDOMINAL HYSTERECTOMY W/ BILATERAL SALPINGOOPHORECTOMY  1981  . TRIGGER FINGER RELEASE  07/28/2012   Procedure: RELEASE TRIGGER FINGER/A-1 PULLEY;  Surgeon: Cammie Sickle., MD;  Location: [facility];  Service: Orthopedics;  Laterality: Right;  EXCISION CYST RIGHT LONG A-1 RELEASE A-1 RIGHT LONG     There were no vitals filed for this visit.  Subjective Assessment - 09/06/18 1010    Subjective  doing well - greater ease with shoulder  HEP; does have to use heating pad for pain relief. Feels a "big difference" in shoulder - just "not there yet"    Pertinent History  Limb-girdle MD, HTN, obesity, osteopenia    Patient Stated Goals  "I want to stay out of the wheelchair."    Currently in Pain?  No/denies    Pain Score  0-No pain                       [facility] Adult PT Treatment/Exercise - 09/06/18 1016      Self-Care   Self-Care  Other Self-Care Comments    Other Self-Care Comments   education/discussion on energy conservation as patient feels as iff she has weak moments - discussed increasing rest breaks and breaking up activity/chores to prevent excessive fatigue      Shoulder Exercises: Isometric Strengthening   Flexion  5X5"    Flexion Limitations  50% effort - cueing for form    Extension  5X5"    Extension Limitations  50% effort - cueing for form    External Rotation  5X5"    External Rotation Limitations  50% effort - cueing for form    Internal Rotation  5X5"    Internal Rotation Limitations  50% effort - cueing for form    ABduction  5X5"    ABduction Limitations  50% effort - cueing for form          Balance Exercises - 09/06/18 1035      Balance Exercises: Standing   Tandem Stance  Eyes open;Intermittent upper extremity support;2 reps;30 secs    Standing, One Foot on a Step  Eyes open;6 inch;4 reps;30 secs   L LE static stance  - R LE green ball rolls   Rockerboard  Anterior/posterior;Head turns;EO;30 seconds;5 reps   1 UE support   Other Standing Exercises  fwd/retro/side stepping in // bars focusing on step length and control of movement to prevent shuffle steps and reduced reliance on UE support          PT Short Term Goals - 08/31/18 1302      PT SHORT TERM GOAL #1   Title  patient to be independent with initial HEP     Baseline  08/14/18 Met.    Time  4    Period  Weeks    Status  Achieved      PT SHORT TERM GOAL #2   Title  patient to improve BERG by >/= 4 points  demonstrating improved balance    Baseline  08/14/18,  39/56, Met.    Time  4    Period  Weeks    Status  Achieved      PT SHORT TERM GOAL #3   Title  patient to improve TUG by >/= 3 seconds    Baseline  08/14/18, 12.38 seconds with rollator, Met.     Time  4    Period  Weeks    Status  Achieved      PT SHORT TERM GOAL #4   Title  patient to demonstrate gait of >/= 400' with LRAD over level indoor/outdoor surfaces including curbs/inclines with appropriate safety awareness and without LOB    Baseline  08/31/18: met today with rollator.     Time  4    Period  Weeks    Status  Achieved        PT Long Term Goals - 08/31/18 1530      PT LONG TERM GOAL #1   Title  patient to be independent with advanced HEP    Time  8    Period  Weeks    Status  New    Target Date  09/12/18      PT LONG TERM GOAL #2   Title  patient to improve gait speed to >/= 3.13f/sec wtih LRAD demonstrating improved functional mobility    Time  8    Period  Weeks    Status  New      PT LONG TERM GOAL #3   Title  patient to improve BERG by >/= 8 points demonstrating reduced fall risk    Time  8    Period  Weeks    Status  New      PT LONG TERM GOAL #4   Title  patient to demonstrate gait of >/=800' over various indoor and outdoor surfaces without LOB or overt instability with LRAD    Time  8    Period  Weeks    Status  New      PT LONG TERM GOAL #5   Title  patient to improve TUG by >/= 7 seconds    Time  8    Period  Weeks    Status  New      PT LONG TERM GOAL #6   Title  patient to demonstrate improved AROM at L shoulder flexion and abduction by >/= 10 degrees needed for improved function    Status  New      PT LONG TERM GOAL #7   Title  patient to report pain reduction by >/= 50% at R shoulder for greater than 2 weeks    Status  New      PT LONG TERM GOAL #8   Title  patient to demonstrate improved function at R shoulder (putting on coat, reaching, light lifting) feeded for ADLs and home  tasks    Status  New            Plan - 09/06/18 1013    Clinical Impression Statement  Session today again reviewing shoulder isometrics; requires cueing for form, posturing and correct muscle activation. Remainderof session focusing on gait to improve step legnth adn to reduce shuffle steps to prevent falls. Patient requiring education on energy conservation techniques to reduce excessive fatigue and to promote increased activity throughout day.     Rehab Potential  Good    PT Frequency  2x / week    PT Duration  8 weeks    PT Treatment/Interventions  ADLs/Self Care Home Management;Therapeutic exercise;Therapeutic activities;Functional mobility training;Stair training;Gait training;DME Instruction;Balance training;Neuromuscular re-education;Patient/family education;Manual techniques;Orthotic Fit/Training;Taping;Cryotherapy;Electrical Stimulation;Moist Heat;Iontophoresis 33m/ml Dexamethasone;Passive range of motion;Dry needling    PT Next Visit Plan   balance with L hip strengthening; HEP for shoulder (continue to review pt questions) - review and update.      PT Home Exercise Plan  [facility] Hospital[facility] [id number](given10/21/2019 )    Consulted and Agree with Plan of Care  Patient       Patient will benefit from skilled therapeutic intervention in order to improve the following deficits and impairments:  Decreased activity tolerance, Decreased balance, Decreased safety awareness, Decreased mobility, Decreased strength, Difficulty walking, Decreased range of motion, Pain, Abnormal gait  Visit Diagnosis: Unsteadiness on feet  Muscle weakness (generalized)  Other abnormalities of gait and mobility  Acute pain of right shoulder  Abnormal posture     Problem List Patient Active Problem List   Diagnosis Date Noted  . Abnormality of gait 12/02/2015  . Risk for falls 12/02/2015  . Sciatica neuralgia 06/10/2015  . Scoliosis (and kyphoscoliosis), idiopathic 06/10/2015  . Limb-girdle  muscular dystrophy ([place] 07/05/2013  . Cervical dysplasia   . IBS (irritable bowel syndrome)   . Macular hole of left eye   . Hypertension   . Pseudocholinesterase deficiency   . [place]cular dystrophy ([place]   . CIN (conjunctival intraepithelial neoplasia)   . Osteoporosis   . Atrophic vaginitis      SLanney Gins PT, DPT Supplemental Physical Therapist 09/06/18 12:07 PM Pager: [phone]: 3[id number] [place] [address]Phone: [phone]  Fax:  [phone] Name: BKATOYA AMATOMRN: [id number] of Birth: 1July 19, 1931

## 2018-09-12 ENCOUNTER — Encounter: Payer: Self-pay | Admitting: Rehabilitation

## 2018-09-12 ENCOUNTER — Ambulatory Visit: Payer: PPO | Attending: Endocrinology | Admitting: Rehabilitation

## 2018-09-12 DIAGNOSIS — M25511 Pain in right shoulder: Secondary | ICD-10-CM | POA: Diagnosis not present

## 2018-09-12 DIAGNOSIS — R2689 Other abnormalities of gait and mobility: Secondary | ICD-10-CM | POA: Diagnosis not present

## 2018-09-12 DIAGNOSIS — R293 Abnormal posture: Secondary | ICD-10-CM | POA: Insufficient documentation

## 2018-09-12 DIAGNOSIS — R2681 Unsteadiness on feet: Secondary | ICD-10-CM | POA: Diagnosis not present

## 2018-09-12 DIAGNOSIS — M6281 Muscle weakness (generalized): Secondary | ICD-10-CM | POA: Insufficient documentation

## 2018-09-12 NOTE — Therapy (Signed)
[facility] [address] [place], [place], [address] Phone: [phone]   Fax:  [phone]  Physical Therapy Treatment  Patient Details  Name: Catherine Harding MRN: [id number] Date of Birth: 1930-01-10 Referring Provider (PT): Dennie Bible, NP   Encounter Date: 09/12/2018  PT End of Session - 09/12/18 1224    Visit Number  12    Number of Visits  17    Date for PT Re-Evaluation  09/12/18    Authorization Type  HTA    PT Start Time  1020    PT Stop Time  1103    PT Time Calculation (min)  43 min    Equipment Utilized During Treatment  Gait belt    Activity Tolerance  Patient tolerated treatment well    Behavior During Therapy  WFL for tasks assessed/performed       Past Medical History:  Diagnosis Date  . Arthritis   . Atrophic vaginitis   . Basal cell carcinoma   . Cervical dysplasia   . Complication of anesthesia    pseudocholinesterase deficiency-hard to wake up  . Falls   . GERD (gastroesophageal reflux disease)   . Hypertension   . IBS (irritable bowel syndrome)   . Jaw atrophy   . 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 Macular hole of left eye   . Muscular dystrophy ([place])   . Osteoporosis    pelvic fracture  . Pseudocholinesterase deficiency   . Scoliosis     Past Surgical History:  Procedure Laterality Date  . ABDOMINAL HYSTERECTOMY     TAH BSO  . BREAST LUMPECTOMY  1971   benign  . CESAREAN SECTION     x2  . COLPOSCOPY    . EYE SURGERY  2003   left  . TOTAL ABDOMINAL HYSTERECTOMY W/ BILATERAL SALPINGOOPHORECTOMY  1981  . TRIGGER FINGER RELEASE  07/28/2012   Procedure: RELEASE TRIGGER FINGER/A-1 PULLEY;  Surgeon: Cammie Sickle., MD;  Location: [facility];  Service: Orthopedics;  Laterality: Right;  EXCISION CYST RIGHT LONG A-1 RELEASE A-1 RIGHT LONG     There were no vitals filed for this visit.  Subjective Assessment - 09/12/18 1022     Subjective  Shoulder is feeling a lot better. Daughter was able to help with exercises. States she feels as though she has gotten better but she is not ready to stop therapy.    Pertinent History  Limb-girdle MD, HTN, obesity, osteopenia    Patient Stated Goals  "I want to stay out of the wheelchair."    Currently in Pain?  Yes    Pain Score  2     Pain Location  Shoulder    Pain Orientation  Right    Pain Descriptors / Indicators  Aching    Pain Type  Acute pain    Pain Onset  More than a month ago    Pain Frequency  Constant    Aggravating Factors   arm movement          [facility] PT Assessment - 09/12/18 1032      Berg Balance Test   Sit to Stand  Able to stand  independently using hands    Standing Unsupported  Able to stand safely 2 minutes    Sitting with Back Unsupported but Feet Supported on Floor or Stool  Able to sit safely and securely 2 minutes    Stand to Sit  Sits safely with minimal use of  hands    Transfers  Able to transfer safely, minor use of hands    Standing Unsupported with Eyes Closed  Able to stand 10 seconds safely    Standing Ubsupported with Feet Together  Able to place feet together independently and stand for 1 minute with supervision    From Standing, Reach Forward with Outstretched Arm  Can reach forward >12 cm safely (5")    From Standing Position, Pick up Object from [place] to pick up shoe, needs supervision    From Standing Position, Turn to Look Behind Over each Shoulder  Looks behind from both sides and weight shifts well    Turn 360 Degrees  Needs close supervision or verbal cueing    Standing Unsupported, Alternately Place Feet on Step/Stool  Needs assistance to keep from falling or unable to try    Standing Unsupported, One Foot in [place] to take small step independently and hold 30 seconds    Standing on One Leg  Tries to lift leg/unable to hold 3 seconds but remains standing independently    Total Score  40      Timed Up and Go Test    TUG  Normal TUG    Normal TUG (seconds)  17.47 with rollator       Gait speed: 2.36 ft/sec with rollator           [facility] Adult PT Treatment/Exercise - 09/12/18 0001      Self-Care   Self-Care  Other Self-Care Comments    Other Self-Care Comments   Reviewed and performed isometric shoulder flexion and abduction exercises on pt HEP. verbal cues and instruction for proper technique and to try using a hand towel instead of pillow against wall.              PT Education - 09/12/18 1225    Education Details  Reviewed and performed isometric shouler exercises on HEP     Person(s) Educated  Patient    Methods  Explanation;Demonstration    Comprehension  Verbalized understanding;Need further instruction       PT Short Term Goals - 08/31/18 1302      PT SHORT TERM GOAL #1   Title  patient to be independent with initial HEP     Baseline  08/14/18 Met.    Time  4    Period  Weeks    Status  Achieved      PT SHORT TERM GOAL #2   Title  patient to improve BERG by >/= 4 points demonstrating improved balance    Baseline  08/14/18,  39/56, Met.    Time  4    Period  Weeks    Status  Achieved      PT SHORT TERM GOAL #3   Title  patient to improve TUG by >/= 3 seconds    Baseline  08/14/18, 12.38 seconds with rollator, Met.     Time  4    Period  Weeks    Status  Achieved      PT SHORT TERM GOAL #4   Title  patient to demonstrate gait of >/= 400' with LRAD over level indoor/outdoor surfaces including curbs/inclines with appropriate safety awareness and without LOB    Baseline  08/31/18: met today with rollator.     Time  4    Period  Weeks    Status  Achieved        PT Long Term Goals - 09/12/18 1024  PT LONG TERM GOAL #1   Title  patient to be independent with advanced HEP    Time  8    Period  Weeks    Status  New      PT LONG TERM GOAL #2   Title  patient to improve gait speed to >/= 3.27f/sec wtih LRAD demonstrating improved functional mobility     Baseline  09/12/18: 2.36 ft/sec    Time  8    Period  Weeks    Status  Not Met      PT LONG TERM GOAL #3   Title  patient to improve BERG by >/= 8 points demonstrating reduced fall risk    Baseline  09/12/18: 40/56    Time  8    Period  Weeks    Status  Achieved      PT LONG TERM GOAL #4   Title  patient to demonstrate gait of >/=800' over various indoor and outdoor surfaces without LOB or overt instability with LRAD    Time  8    Period  Weeks    Status  New      PT LONG TERM GOAL #5   Title  patient to improve TUG by >/= 7 seconds    Baseline  09/12/18: 17.47 sec    Time  8    Period  Weeks    Status  Achieved      PT LONG TERM GOAL #6   Title  patient to demonstrate improved AROM at L shoulder flexion and abduction by >/= 10 degrees needed for improved function    Status  New      PT LONG TERM GOAL #7   Title  patient to report pain reduction by >/= 50% at R shoulder for greater than 2 weeks    Status  New      PT LONG TERM GOAL #8   Title  patient to demonstrate improved function at R shoulder (putting on coat, reaching, light lifting) feeded for ADLs and home tasks    Status  New            Plan - 09/12/18 1228    Clinical Impression Statement  Today's session focused on assessing LTGs with pt meeting 2/3 checked today for TUG and BERG and not meeting 1/3 checked today for gait speed. Updated and performed isometric shoulder exerices on HEP to ensure pt understanding. Pt has shown progress but would benefit from further PT to continue to address balance and functional mobility deficits. Talked with pt about extending therpy 2/wk for 4 more wks with pt in agreement.     Rehab Potential  Good    PT Frequency  2x / week    PT Duration  8 weeks    PT Treatment/Interventions  ADLs/Self Care Home Management;Therapeutic exercise;Therapeutic activities;Functional mobility training;Stair training;Gait training;DME Instruction;Balance training;Neuromuscular  re-education;Patient/family education;Manual techniques;Orthotic Fit/Training;Taping;Cryotherapy;Electrical Stimulation;Moist Heat;Iontophoresis 484mml Dexamethasone;Passive range of motion;Dry needling    PT Next Visit Plan  Recert for 4 more weeks (revise TUG and BERG goals) Appointments made 2/wk for 4 more weeks. Continue checking LTGs. balance with L hip strengthening; HEP for shoulder (continue to review pt questions) - review and update.      PT Home Exercise Plan  [facility] Hospital[facility] [id number]/21/2019 )    Consulted and Agree with Plan of Care  Patient       Patient will benefit from skilled therapeutic intervention in order to improve the following deficits and impairments:  Decreased  activity tolerance, Decreased balance, Decreased safety awareness, Decreased mobility, Decreased strength, Difficulty walking, Decreased range of motion, Pain, Abnormal gait  Visit Diagnosis: Unsteadiness on feet  Muscle weakness (generalized)  Other abnormalities of gait and mobility  Acute pain of right shoulder     Problem List Patient Active Problem List   Diagnosis Date Noted  . Abnormality of gait 12/02/2015  . Risk for falls 12/02/2015  . Sciatica neuralgia 06/10/2015  . Scoliosis (and kyphoscoliosis), idiopathic 06/10/2015  . 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 Cervical dysplasia   . IBS (irritable bowel syndrome)   . Macular hole of left eye   . Hypertension   . Pseudocholinesterase deficiency   . Muscular dystrophy ([place])   . CIN (conjunctival intraepithelial neoplasia)   . Osteoporosis   . Atrophic vaginitis     Cecile Sheerer, SPTA 09/12/2018, 12:40 PM  [facility] [address] [place], [place], [address] Phone: [phone]   Fax:  [phone]  Name: CHANIKA BYLAND MRN: [id number] Date of Birth: 03-21-1930

## 2018-09-12 NOTE — Patient Instructions (Signed)
Flexion (Assistive)   Clasp hands together and raise arms above head, keeping elbows as straight as possible. Can be done sitting or lying. Repeat __10__ times. Do __2__ sessions per day.  External Rotation (Eccentric), Active-Assist - Supine (Cane)   Lie on back, affected arm out from side, elbow at 90, forearm forward. Use cane to assist in lifting forearm of affected arm to neutral. Slowly lower for 3-5 seconds. _10__ reps per set, _2__ sets per day Have towel rolled under elbow  Cane Exercise: Abduction   Hold cane with right hand over end, palm-up, with other hand palm-down. Move arm out from side and up by pushing with other arm. Hold __3__ seconds. Repeat __10__ times. Do __2__ sessions per day.  Strengthening: Isometric Flexion  Using wall for resistance, press right fist into ball using light pressure. Hold __5__ seconds. Repeat _10___ times per set.   SHOULDER: Abduction (Isometric)  Use wall as resistance. Press arm against pillow. Keep elbow straight. Hold _5__ seconds. _10__ reps per set  Extension (Isometric)  Place left bent elbow and back of arm against wall. Press elbow against wall. Hold __10__ seconds. Repeat __5__ times.   Internal Rotation (Isometric)  Place palm of right fist against door frame, with elbow bent. Press fist against door frame. Hold __5__ seconds. Repeat _10___ times.  External Rotation (Isometric)  Place back of left fist against door frame, with elbow bent. Press fist against door frame. Hold __5__ seconds. Repeat __10__ times.

## 2018-09-13 ENCOUNTER — Telehealth: Payer: Self-pay | Admitting: *Deleted

## 2018-09-13 DIAGNOSIS — Z85828 Personal history of other malignant neoplasm of skin: Secondary | ICD-10-CM | POA: Diagnosis not present

## 2018-09-13 DIAGNOSIS — C44311 Basal cell carcinoma of skin of nose: Secondary | ICD-10-CM | POA: Diagnosis not present

## 2018-09-13 NOTE — Telephone Encounter (Signed)
Anderson Malta from [facility] called, stating pt had fall this am at home, hit her head on counter of kitchen.  Is there for appt with them for MOHS procedure.  She is bruised, had bloody noise.  Asking [place] about seeing pt [place] slots.  I told her I did not have availability for something like this.  Recommended ED for evaluation.  She is not on blood thinner, but being older and hitting head recommended eval at ED. We see her for MD, and she is receiving PT at this time as well.  PCP was called initially and then referred to [place].  FYI

## 2018-09-14 ENCOUNTER — Ambulatory Visit: Payer: PPO | Admitting: Rehabilitation

## 2018-09-14 DIAGNOSIS — R2681 Unsteadiness on feet: Secondary | ICD-10-CM

## 2018-09-14 DIAGNOSIS — R2689 Other abnormalities of gait and mobility: Secondary | ICD-10-CM

## 2018-09-14 DIAGNOSIS — M25511 Pain in right shoulder: Secondary | ICD-10-CM

## 2018-09-14 DIAGNOSIS — M6281 Muscle weakness (generalized): Secondary | ICD-10-CM

## 2018-09-14 NOTE — Therapy (Signed)
[facility] [address] [place], [place], [address] Phone: [phone]   Fax:  [phone]  Physical Therapy Treatment  Patient Details  Name: Catherine Harding MRN: [id number] Date of Birth: 1930-08-15 Referring Provider (PT): Dennie Bible, NP   Encounter Date: 09/14/2018  PT End of Session - 09/14/18 1223    Visit Number  13    Number of Visits  25   per updated POC   Date for PT Re-Evaluation  11/13/18    Authorization Type  HTA    PT Start Time  1018    PT Stop Time  1100    PT Time Calculation (min)  42 min    Equipment Utilized During Treatment  Gait belt    Activity Tolerance  Patient tolerated treatment well    Behavior During Therapy  [facility] for tasks assessed/performed       Past Medical History:  Diagnosis Date  . Arthritis   . Atrophic vaginitis   . Basal cell carcinoma   . Cervical dysplasia   . Complication of anesthesia    pseudocholinesterase deficiency-hard to wake up  . Falls   . GERD (gastroesophageal reflux disease)   . Hypertension   . IBS (irritable bowel syndrome)   . Jaw atrophy   . 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 Macular hole of left eye   . Muscular dystrophy ([place])   . Osteoporosis    pelvic fracture  . Pseudocholinesterase deficiency   . Scoliosis     Past Surgical History:  Procedure Laterality Date  . ABDOMINAL HYSTERECTOMY     TAH BSO  . BREAST LUMPECTOMY  1971   benign  . CESAREAN SECTION     x2  . COLPOSCOPY    . EYE SURGERY  2003   left  . TOTAL ABDOMINAL HYSTERECTOMY W/ BILATERAL SALPINGOOPHORECTOMY  1981  . TRIGGER FINGER RELEASE  07/28/2012   Procedure: RELEASE TRIGGER FINGER/A-1 PULLEY;  Surgeon: Cammie Sickle., MD;  Location: [facility];  Service: Orthopedics;  Laterality: Right;  EXCISION CYST RIGHT LONG A-1 RELEASE A-1 RIGHT LONG     There were no vitals filed for this visit.  Subjective Assessment -  09/14/18 1200    Subjective  Fell yesterday morning while in the kitchen while trying to get a bowl, she hit her head on the kitchen cabinet, her nose bled pretty badly, and her shoulder was hurt again. She is not sure what happened to make her fall. Stated her rollator was there but she was not able to hold on to it because of the tight space in the kitchen. Denies any dizziness or lightheadedness.      Pertinent History  Limb-girdle MD, HTN, obesity, osteopenia    Patient Stated Goals  "I want to stay out of the wheelchair."    Currently in Pain?  Yes    Pain Score  8     Pain Location  Shoulder    Pain Orientation  Right    Pain Descriptors / Indicators  Tightness;Spasm;Aching    Pain Type  Acute pain    Pain Onset  Yesterday    Pain Frequency  Constant    Aggravating Factors   movement                       [facility] Adult PT Treatment/Exercise - 09/14/18 1209      Ambulation/Gait   Ambulation/Gait  Yes  Ambulation/Gait Assistance  5: Supervision    Ambulation/Gait Assistance Details  No LOB noted. Verbal cues for proper distance from RW    Ambulation Distance (Feet)  915 Feet   85f with rollator outdoor and indoor; 1172fwith RW indoor   Assistive device  Rolling walker;Rollator    Gait Pattern  Step-through pattern;Decreased stride length;Decreased hip/knee flexion - right;Decreased hip/knee flexion - left;Trunk flexed    Ambulation Surface  Unlevel;Outdoor;Paved;Indoor;Level      Self-Care   Self-Care  Other Self-Care Comments    Other Self-Care Comments   Pt observed PT perform proper negotiation in small spaces in ADL kitchen with RW. Educated aboutiportance of having either RW or rollator at all times to ensure safety and use of RW in tighter spaces for better negotiation.       Therapeutic Activites    Therapeutic Activities  Other Therapeutic Activities    Other Therapeutic Activities  Pt observed PT perform proper negotiation in small spaces in kitchen  with RW      Manual Therapy   Manual Therapy  Soft tissue mobilization;Myofascial release    Manual therapy comments  To R upper trap approx. 5 min. to reduce pain.     Soft tissue mobilization  STM to R upper trap    Myofascial Release  Myofascial release to R upper trap    Other Manual Therapy  --             PT Education - 09/14/18 1220    Education Details  Recommened to go to Urgent Care to have imaging done on shoulder and applying heating pd to right shoulder/upper trap to reduce muscular tighness and soreness. Proper RW height. See self care comments.     Person(s) Educated  Patient    Methods  Explanation;Demonstration;Verbal cues    Comprehension  Verbalized understanding       PT Short Term Goals - 08/31/18 1302      PT SHORT TERM GOAL #1   Title  patient to be independent with initial HEP     Baseline  08/14/18 Met.    Time  4    Period  Weeks    Status  Achieved      PT SHORT TERM GOAL #2   Title  patient to improve BERG by >/= 4 points demonstrating improved balance    Baseline  08/14/18,  39/56, Met.    Time  4    Period  Weeks    Status  Achieved      PT SHORT TERM GOAL #3   Title  patient to improve TUG by >/= 3 seconds    Baseline  08/14/18, 12.38 seconds with rollator, Met.     Time  4    Period  Weeks    Status  Achieved      PT SHORT TERM GOAL #4   Title  patient to demonstrate gait of >/= 400' with LRAD over level indoor/outdoor surfaces including curbs/inclines with appropriate safety awareness and without LOB    Baseline  08/31/18: met today with rollator.     Time  4    Period  Weeks    Status  Achieved        PT Long Term Goals - 09/14/18 1239      PT LONG TERM GOAL #1   Title  patient to be independent with advanced HEP (Updated STG Date: 10/14/2018)    Time  4    Period  Weeks  Status  On-going    Target Date  10/14/18      PT LONG TERM GOAL #2   Title  patient to improve gait speed to >/= 3.0 ft/sec wtih LRAD demonstrating  improved functional mobility    Baseline  09/12/18: 2.36 ft/sec    Time  8    Period  Weeks    Status  Revised      PT LONG TERM GOAL #3   Title  patient to improve BERG to 44/56 demonstrating reduced fall risk    Baseline  09/12/18: 40/56    Time  8    Period  Weeks    Status  Achieved      PT LONG TERM GOAL #4   Title  patient to demonstrate gait of >/=800' over various indoor and outdoor surfaces without LOB or overt instability with LRAD    Baseline  09/14/18: met today with rollator    Time  8    Period  Weeks    Status  Achieved      PT LONG TERM GOAL #5   Title  Pt will improve TUG to </=14 secs with rollator in order to indicate decreased fall risk.     Baseline  09/12/18: 17.47 sec    Time  8    Period  Weeks    Status  Revised      PT LONG TERM GOAL #6   Title  patient to demonstrate improved AROM at L shoulder flexion and abduction by >/= 10 degrees needed for improved function    Baseline  09/14/18: flexion: 79 degrees; abduction: 3 degrees. Pt unable to meet due to recent fall and injury to right shoulder.    Status  On-going      PT LONG TERM GOAL #7   Title  patient to report pain reduction by >/= 50% at R shoulder for greater than 2 weeks    Baseline  09/14/18: Current pain 8/10 today due to recent fall and injury of shoulder, however pt reports improved pain prior to incident.     Status  On-going      PT LONG TERM GOAL #8   Title  patient to demonstrate improved function at R shoulder (putting on coat, reaching, light lifting) feeded for ADLs and home tasks    Baseline  09/14/18: not met today due to current fall and injury of shoulder, however pt reports improved function prior to incident.    Status  On-going            Plan - 09/14/18 1225    Clinical Impression Statement  Today's treatment focused on assessing remaining LTGs with pt meeting 1/4 checked today for outdoor gait with rollator and not meeting 3/4 for improved R shoulder pain, R shoulder  function, and AROM due to recent fall and injury yesterday. However pt reports improved pain and function prior to incident. Pt would benefit from furth PT to continue progressing toward goals.     Rehab Potential  Good    PT Frequency  2x / week    PT Duration  4 weeks    PT Treatment/Interventions  ADLs/Self Care Home Management;Therapeutic exercise;Therapeutic activities;Functional mobility training;Stair training;Gait training;DME Instruction;Balance training;Neuromuscular re-education;Patient/family education;Manual techniques;Orthotic Fit/Training;Taping;Cryotherapy;Electrical Stimulation;Moist Heat;Iontophoresis 28m/ml Dexamethasone;Passive range of motion;Dry needling    PT Next Visit Plan   did she end up going to urgent care? re-assess shoulder, Appointments made 2/wk for 4 more weeks. Continue checking remaining for HEP. LTGs. balance with L hip strengthening;  HEP for shoulder (continue to review pt questions) - review and update.      PT Home Exercise Plan  [facility] and [id number] (given10/21/2019 )    Consulted and Agree with Plan of Care  Patient       Patient will benefit from skilled therapeutic intervention in order to improve the following deficits and impairments:  Decreased activity tolerance, Decreased balance, Decreased safety awareness, Decreased mobility, Decreased strength, Difficulty walking, Decreased range of motion, Pain, Abnormal gait  Visit Diagnosis: Unsteadiness on feet  Muscle weakness (generalized)  Other abnormalities of gait and mobility  Acute pain of right shoulder     Problem List Patient Active Problem List   Diagnosis Date Noted  . Abnormality of gait 12/02/2015  . Risk for falls 12/02/2015  . Sciatica neuralgia 06/10/2015  . Scoliosis (and kyphoscoliosis), idiopathic 06/10/2015  . 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 Cervical dysplasia   . IBS (irritable bowel syndrome)   . Macular hole of left eye   . Hypertension   .  Pseudocholinesterase deficiency   . Muscular dystrophy ([place])   . CIN (conjunctival intraepithelial neoplasia)   . Osteoporosis   . Atrophic vaginitis     Catherine Harding 09/14/2018, 2:41 PM  [facility] [address] [place], [place], [address] Phone: [phone]   Fax:  [phone]  Name: Catherine Harding MRN: [id number] Date of Birth: October 16, 1929

## 2018-09-18 ENCOUNTER — Other Ambulatory Visit: Payer: Self-pay | Admitting: *Deleted

## 2018-09-18 DIAGNOSIS — R269 Unspecified abnormalities of gait and mobility: Secondary | ICD-10-CM

## 2018-09-18 DIAGNOSIS — G7109 Other specified muscular dystrophies: Secondary | ICD-10-CM

## 2018-09-18 DIAGNOSIS — G71031 Autosomal dominant limb girdle muscular dystrophy: Secondary | ICD-10-CM

## 2018-09-18 NOTE — Patient Outreach (Addendum)
Catherine Harding) Care Management  09/18/2018  Catherine Harding 1930/04/05 [id number]   TELEPHONE SCREENING Referral date: 09/15/18 Referral source:Self referral to [employer] Referral reason:Resources for home fall alert systems Insurance:[employer] Plan 1  Successful initial call for telephone screening and to assist with home fall alert systems as patient has   Subjective: Catherine Harding states she has gait disorder secondary to  limb girdle muscular dystrophy.  She says she also has scoliosis which has led to stiffness in her lower extremities problems with arising from a chair or from a seated position. She reports that her gait has gotten worse and she fell recently and is asking for help with home fall alert systems. She says she is on a limited income and cannot afford a monthly fee. She says she fell in her kitchen, hit her head, which led to two black eye,  and exacerbated a previous torn right shoulder rotator cuff injury. She says the only thing she can think of that contributed to the fall was related to shoes she had not worn recently. She says she has chronic  lower extremity swelling and usually wears tennis shoes. However the morning of the fall, her edema was so improved she was able to wear casual flats. She says she did not lose consciousness when she fell. She says she is currently going to outpatient physical therapy twice weekly for balance therapy.  The only other health issue she reports is HTN that she says is well controlled with her current medications. She denies the offer of a [facility] health practitioner.  Plan: Referral to [place] [employer] social worker to provide options for an affordable home fall alert system.  Barrington Ellison RN,CCM,CDE [place] Management Coordinator Office Phone [phone] Office Fax [phone]

## 2018-09-18 NOTE — Patient Outreach (Signed)
[facility]) Care Management  09/18/2018  SAMYIAH HALVORSEN 05-27-1930 [id number]   Referral received to this covering social worker while assigned social worker is on pal to provide patient with information for an affordable med alert system in her home due to multiple falls. Per patient, she  has a ADT security system which comes with a hand fob  to be used for emergencies, however per patient she has a hard time remembering  to keep it with her. This Education officer, museum discussed alternative med alert programs but encouraged patient to consider using her hand fob through her existing security system to save on additional costs. Patient states that she will consider this but just wanted to know her options. Patient verbalized having no additional social work Data processing manager needs. This Education officer, museum to sign off at this time.   Sheralyn Boatman [facility] Care Management [phone]

## 2018-09-20 DIAGNOSIS — M25511 Pain in right shoulder: Secondary | ICD-10-CM | POA: Diagnosis not present

## 2018-09-21 ENCOUNTER — Ambulatory Visit: Payer: PPO | Admitting: Rehabilitation

## 2018-09-21 ENCOUNTER — Encounter: Payer: Self-pay | Admitting: Rehabilitation

## 2018-09-21 DIAGNOSIS — M6281 Muscle weakness (generalized): Secondary | ICD-10-CM

## 2018-09-21 DIAGNOSIS — R2681 Unsteadiness on feet: Secondary | ICD-10-CM

## 2018-09-21 DIAGNOSIS — R2689 Other abnormalities of gait and mobility: Secondary | ICD-10-CM

## 2018-09-21 DIAGNOSIS — M25511 Pain in right shoulder: Secondary | ICD-10-CM

## 2018-09-21 DIAGNOSIS — R293 Abnormal posture: Secondary | ICD-10-CM

## 2018-09-21 NOTE — Therapy (Signed)
[facility] [address] [place], [place], [address] Phone: [phone]   Fax:  [phone]  Physical Therapy Treatment  Patient Details  Name: Catherine Harding MRN: [id number] Date of Birth: Jul 11, 1930 Referring Provider (PT): Dennie Bible, NP   Encounter Date: 09/21/2018  PT End of Session - 09/21/18 1556    Visit Number  14    Number of Visits  25   per updated POC   Date for PT Re-Evaluation  11/13/18    Authorization Type  HTA    PT Start Time  1330   pt late to session   PT Stop Time  1403    PT Time Calculation (min)  33 min    Equipment Utilized During Treatment  Gait belt    Activity Tolerance  Patient tolerated treatment well    Behavior During Therapy  [facility] for tasks assessed/performed       Past Medical History:  Diagnosis Date  . Arthritis   . Atrophic vaginitis   . Basal cell carcinoma   . Cervical dysplasia   . Complication of anesthesia    pseudocholinesterase deficiency-hard to wake up  . Falls   . GERD (gastroesophageal reflux disease)   . Hypertension   . IBS (irritable bowel syndrome)   . Jaw atrophy   . 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 Macular hole of left eye   . Muscular dystrophy ([place])   . Osteoporosis    pelvic fracture  . Pseudocholinesterase deficiency   . Scoliosis     Past Surgical History:  Procedure Laterality Date  . ABDOMINAL HYSTERECTOMY     TAH BSO  . BREAST LUMPECTOMY  1971   benign  . CESAREAN SECTION     x2  . COLPOSCOPY    . EYE SURGERY  2003   left  . TOTAL ABDOMINAL HYSTERECTOMY W/ BILATERAL SALPINGOOPHORECTOMY  1981  . TRIGGER FINGER RELEASE  07/28/2012   Procedure: RELEASE TRIGGER FINGER/A-1 PULLEY;  Surgeon: Cammie Sickle., MD;  Location: [facility];  Service: Orthopedics;  Laterality: Right;  EXCISION CYST RIGHT LONG A-1 RELEASE A-1 RIGHT LONG     There were no vitals filed for this  visit.  Subjective Assessment - 09/21/18 1332    Subjective  Pt reports getting injection in shoulder and now with no pain today.  Stitches removed from nose. Has not been doing exercises due to soreness.     Pertinent History  Limb-girdle MD, HTN, obesity, osteopenia    Patient Stated Goals  "I want to stay out of the wheelchair."    Currently in Pain?  No/denies               Access Code: [email or web address]  URL: [email or web address]  Date: 09/21/2018  Prepared by: Cameron Sprang   Exercises  Romberg Stance with Head Nods - 5 reps - 2 sets - 1-2x daily - 5x weekly  Standing Thoracic and Cervical Rotation with Reach at [place] - 5 reps - 2 sets - 1-2x daily - 5x weekly  March in Place - 5 reps - 2 sets - 1x daily - 5x weekly  Side Stepping with Resistance at Thighs - 4 reps - 1 sets - 1-2x daily - 7x weekly   Access Code: [email or web address]  URL: [email or web address]  Date: 09/21/2018  Prepared by: Cameron Sprang   Exercises  Seated Long Arc Quad - 10 reps - 2 sets - 1x daily - 7x weekly  Supine Straight  Leg Raises - 5 reps - 2 sets - 1x daily - 7x weekly  Bent Knee Fallouts - 5 reps - 2 sets - 1x daily - 7x weekly  Supine Bridge - 5 reps - 2 sets - 1x daily - 7x weekly  Seated Scapular Retraction - 5-10 reps - 2 sets - 1x daily - 7x weekly     TE:  Also performed R upper trap stretch x 2 reps of 30 secs.  Provided light soft tissue mobilization during second stretch to reduce muscle tension.                   PT Education - 09/21/18 1556    Education Details  purpose of dry needling and having done to R upper trap tomorrow    Person(s) Educated  Patient    Methods  Explanation    Comprehension  Verbalized understanding       PT Short Term Goals - 08/31/18 1302      PT SHORT TERM GOAL #1   Title  patient to be independent with initial HEP     Baseline  08/14/18 Met.    Time  4    Period  Weeks    Status  Achieved      PT SHORT TERM GOAL  #2   Title  patient to improve BERG by >/= 4 points demonstrating improved balance    Baseline  08/14/18,  39/56, Met.    Time  4    Period  Weeks    Status  Achieved      PT SHORT TERM GOAL #3   Title  patient to improve TUG by >/= 3 seconds    Baseline  08/14/18, 12.38 seconds with rollator, Met.     Time  4    Period  Weeks    Status  Achieved      PT SHORT TERM GOAL #4   Title  patient to demonstrate gait of >/= 400' with LRAD over level indoor/outdoor surfaces including curbs/inclines with appropriate safety awareness and without LOB    Baseline  08/31/18: met today with rollator.     Time  4    Period  Weeks    Status  Achieved        PT Long Term Goals - 09/14/18 1239      PT LONG TERM GOAL #1   Title  patient to be independent with advanced HEP (Updated STG Date: 10/14/2018)    Time  4    Period  Weeks    Status  On-going    Target Date  10/14/18      PT LONG TERM GOAL #2   Title  patient to improve gait speed to >/= 3.0 ft/sec wtih LRAD demonstrating improved functional mobility    Baseline  09/12/18: 2.36 ft/sec    Time  8    Period  Weeks    Status  Revised      PT LONG TERM GOAL #3   Title  patient to improve BERG to 44/56 demonstrating reduced fall risk    Baseline  09/12/18: 40/56    Time  8    Period  Weeks    Status  Achieved      PT LONG TERM GOAL #4   Title  patient to demonstrate gait of >/=800' over various indoor and outdoor surfaces without LOB or overt instability with LRAD    Baseline  09/14/18: met today with rollator    Time  8  Period  Weeks    Status  Achieved      PT LONG TERM GOAL #5   Title  Pt will improve TUG to </=14 secs with rollator in order to indicate decreased fall risk.     Baseline  09/12/18: 17.47 sec    Time  8    Period  Weeks    Status  Revised      PT LONG TERM GOAL #6   Title  patient to demonstrate improved AROM at L shoulder flexion and abduction by >/= 10 degrees needed for improved function    Baseline   09/14/18: flexion: 79 degrees; abduction: 3 degrees. Pt unable to meet due to recent fall and injury to right shoulder.    Status  On-going      PT LONG TERM GOAL #7   Title  patient to report pain reduction by >/= 50% at R shoulder for greater than 2 weeks    Baseline  09/14/18: Current pain 8/10 today due to recent fall and injury of shoulder, however pt reports improved pain prior to incident.     Status  On-going      PT LONG TERM GOAL #8   Title  patient to demonstrate improved function at R shoulder (putting on coat, reaching, light lifting) feeded for ADLs and home tasks    Baseline  09/14/18: not met today due to current fall and injury of shoulder, however pt reports improved function prior to incident.    Status  On-going            Plan - 09/21/18 1557    Clinical Impression Statement  Skilled session focused on assessment of HEP.  Pt continues to have balance deficits, but did demo improvement in L hip stability today.  Added red theraband to side stepping at counter to increase challenge.  Discussed dry needling and having this done at tomorrows session to R upper trap to relieve some pain/tightness.     Rehab Potential  Good    PT Frequency  2x / week    PT Duration  4 weeks    PT Treatment/Interventions  ADLs/Self Care Home Management;Therapeutic exercise;Therapeutic activities;Functional mobility training;Stair training;Gait training;DME Instruction;Balance training;Neuromuscular re-education;Patient/family education;Manual techniques;Orthotic Fit/Training;Taping;Cryotherapy;Electrical Stimulation;Moist Heat;Iontophoresis 55m/ml Dexamethasone;Passive range of motion;Dry needling    PT Next Visit Plan  Dry needling to R upper trap, balance with L hip strengthening; HEP for shoulder (continue to review pt questions) - review and update.      PT Home Exercise Plan  [id number](given10/21/2019 )    Consulted and Agree with Plan of Care  Patient       Patient will  benefit from skilled therapeutic intervention in order to improve the following deficits and impairments:  Decreased activity tolerance, Decreased balance, Decreased safety awareness, Decreased mobility, Decreased strength, Difficulty walking, Decreased range of motion, Pain, Abnormal gait  Visit Diagnosis: Unsteadiness on feet  Muscle weakness (generalized)  Other abnormalities of gait and mobility  Acute pain of right shoulder  Abnormal posture     Problem List Patient Active Problem List   Diagnosis Date Noted  . Abnormality of gait 12/02/2015  . Risk for falls 12/02/2015  . Sciatica neuralgia 06/10/2015  . Scoliosis (and kyphoscoliosis), idiopathic 06/10/2015  . Limb-girdle muscular dystrophy ([place] 07/05/2013  . Cervical dysplasia   . IBS (irritable bowel syndrome)   . Macular hole of left eye   . Hypertension   . Pseudocholinesterase deficiency   . [place]   .  CIN (conjunctival intraepithelial neoplasia)   . Osteoporosis   . Atrophic vaginitis     Cameron Sprang, PT, MPT [facility] [address] [place], [place], [address] Phone: [phone]   Fax:  [phone] 09/21/18, 4:02 PM  Name: Catherine Harding MRN: [id number] Date of Birth: 1929/11/08

## 2018-09-21 NOTE — Patient Instructions (Addendum)
Access Code: [email or web address]  URL: [email or web address]  Date: 09/21/2018  Prepared by: Cameron Sprang   Exercises  Romberg Stance with Head Nods - 5 reps - 2 sets - 1-2x daily - 5x weekly  Standing Thoracic and Cervical Rotation with Reach at [place] - 5 reps - 2 sets - 1-2x daily - 5x weekly  March in Place - 5 reps - 2 sets - 1x daily - 5x weekly  Side Stepping with Resistance at Thighs - 4 reps - 1 sets - 1-2x daily - 7x weekly   Access Code: [email or web address]  URL: [email or web address]  Date: 09/21/2018  Prepared by: Cameron Sprang   Exercises  Seated Long Arc Quad - 10 reps - 2 sets - 1x daily - 7x weekly  Supine Straight Leg Raises - 5 reps - 2 sets - 1x daily - 7x weekly  Bent Knee Fallouts - 5 reps - 2 sets - 1x daily - 7x weekly  Supine Bridge - 5 reps - 2 sets - 1x daily - 7x weekly  Seated Scapular Retraction - 5-10 reps - 2 sets - 1x daily - 7x weekly

## 2018-09-22 ENCOUNTER — Encounter: Payer: Self-pay | Admitting: Physical Therapy

## 2018-09-22 ENCOUNTER — Ambulatory Visit: Payer: PPO | Admitting: Physical Therapy

## 2018-09-22 DIAGNOSIS — R2681 Unsteadiness on feet: Secondary | ICD-10-CM | POA: Diagnosis not present

## 2018-09-22 DIAGNOSIS — R293 Abnormal posture: Secondary | ICD-10-CM

## 2018-09-22 DIAGNOSIS — M6281 Muscle weakness (generalized): Secondary | ICD-10-CM

## 2018-09-22 DIAGNOSIS — M25511 Pain in right shoulder: Secondary | ICD-10-CM

## 2018-09-22 DIAGNOSIS — R2689 Other abnormalities of gait and mobility: Secondary | ICD-10-CM

## 2018-09-22 NOTE — Therapy (Signed)
[facility] [address] [place], [place], [address] Phone: [phone]   Fax:  [phone]  Physical Therapy Treatment  Patient Details  Name: Catherine Harding MRN: [id number] Date of Birth: [birth year] Referring Provider (PT): Dennie Bible, NP   Encounter Date: 09/22/2018  PT End of Session - 09/22/18 1318    Visit Number  15    Number of Visits  25   per updated POC   Date for PT Re-Evaluation  11/13/18    Authorization Type  HTA    PT Start Time  1150    PT Stop Time  1230    PT Time Calculation (min)  40 min    Equipment Utilized During Treatment  Gait belt    Activity Tolerance  Patient tolerated treatment well    Behavior During Therapy  [facility] for tasks assessed/performed       Past Medical History:  Diagnosis Date  . Arthritis   . Atrophic vaginitis   . Basal cell carcinoma   . Cervical dysplasia   . Complication of anesthesia    pseudocholinesterase deficiency-hard to wake up  . Falls   . GERD (gastroesophageal reflux disease)   . Hypertension   . IBS (irritable bowel syndrome)   . Jaw atrophy   . 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 Macular hole of left eye   . Muscular dystrophy ([place])   . Osteoporosis    pelvic fracture  . Pseudocholinesterase deficiency   . Scoliosis     Past Surgical History:  Procedure Laterality Date  . ABDOMINAL HYSTERECTOMY     TAH BSO  . BREAST LUMPECTOMY  1971   benign  . CESAREAN SECTION     x2  . COLPOSCOPY    . EYE SURGERY  2003   left  . TOTAL ABDOMINAL HYSTERECTOMY W/ BILATERAL SALPINGOOPHORECTOMY  1981  . TRIGGER FINGER RELEASE  07/28/2012   Procedure: RELEASE TRIGGER FINGER/A-1 PULLEY;  Surgeon: Cammie Sickle., MD;  Location: [facility];  Service: Orthopedics;  Laterality: Right;  EXCISION CYST RIGHT LONG A-1 RELEASE A-1 RIGHT LONG     There were no vitals filed for this visit.  Subjective Assessment -  09/22/18 1300    Subjective  reports pain as her primary complaint - has been using heat at home    Pertinent History  Limb-girdle MD, HTN, obesity, osteopenia    Patient Stated Goals  "I want to stay out of the wheelchair."    Currently in Pain?  Yes    Pain Score  4     Pain Location  Neck   and R shoulder mm   Pain Orientation  Right    Pain Descriptors / Indicators  Aching;Discomfort;Throbbing    Pain Type  Acute pain                       [facility] Adult PT Treatment/Exercise - 09/22/18 0001      Bed Mobility   Bed Mobility  Sit to Supine;Supine to Sit;Rolling Left    Rolling Left  Supervision/Verbal cueing    Supine to Sit  Moderate Assistance - Patient 50-74%    Sit to Supine  Contact Guard/Touching assist      Manual Therapy   Manual Therapy  Soft tissue mobilization;Myofascial release    Manual therapy comments  patient in L side-lying    Soft tissue mobilization  STM to R shoulder girdle - palpable trigger  points throughout    Myofascial Release  manual trigger point release to thoracic paraspinals, R infra, R UT, R teres group               PT Short Term Goals - 08/31/18 1302      PT SHORT TERM GOAL #1   Title  patient to be independent with initial HEP     Baseline  08/14/18 Met.    Time  4    Period  Weeks    Status  Achieved      PT SHORT TERM GOAL #2   Title  patient to improve BERG by >/= 4 points demonstrating improved balance    Baseline  08/14/18,  39/56, Met.    Time  4    Period  Weeks    Status  Achieved      PT SHORT TERM GOAL #3   Title  patient to improve TUG by >/= 3 seconds    Baseline  08/14/18, 12.38 seconds with rollator, Met.     Time  4    Period  Weeks    Status  Achieved      PT SHORT TERM GOAL #4   Title  patient to demonstrate gait of >/= 400' with LRAD over level indoor/outdoor surfaces including curbs/inclines with appropriate safety awareness and without LOB    Baseline  08/31/18: met today with rollator.      Time  4    Period  Weeks    Status  Achieved        PT Long Term Goals - 09/14/18 1239      PT LONG TERM GOAL #1   Title  patient to be independent with advanced HEP (Updated STG Date: 10/14/2018)    Time  4    Period  Weeks    Status  On-going    Target Date  10/14/18      PT LONG TERM GOAL #2   Title  patient to improve gait speed to >/= 3.0 ft/sec wtih LRAD demonstrating improved functional mobility    Baseline  09/12/18: 2.36 ft/sec    Time  8    Period  Weeks    Status  Revised      PT LONG TERM GOAL #3   Title  patient to improve BERG to 44/56 demonstrating reduced fall risk    Baseline  09/12/18: 40/56    Time  8    Period  Weeks    Status  Achieved      PT LONG TERM GOAL #4   Title  patient to demonstrate gait of >/=800' over various indoor and outdoor surfaces without LOB or overt instability with LRAD    Baseline  09/14/18: met today with rollator    Time  8    Period  Weeks    Status  Achieved      PT LONG TERM GOAL #5   Title  Pt will improve TUG to </=14 secs with rollator in order to indicate decreased fall risk.     Baseline  09/12/18: 17.47 sec    Time  8    Period  Weeks    Status  Revised      PT LONG TERM GOAL #6   Title  patient to demonstrate improved AROM at L shoulder flexion and abduction by >/= 10 degrees needed for improved function    Baseline  09/14/18: flexion: 79 degrees; abduction: 3 degrees. Pt unable to meet due to recent fall and  injury to right shoulder.    Status  On-going      PT LONG TERM GOAL #7   Title  patient to report pain reduction by >/= 50% at R shoulder for greater than 2 weeks    Baseline  09/14/18: Current pain 8/10 today due to recent fall and injury of shoulder, however pt reports improved pain prior to incident.     Status  On-going      PT LONG TERM GOAL #8   Title  patient to demonstrate improved function at R shoulder (putting on coat, reaching, light lifting) feeded for ADLs and home tasks    Baseline  09/14/18:  not met today due to current fall and injury of shoulder, however pt reports improved function prior to incident.    Status  On-going            Plan - 09/22/18 1318    Clinical Impression Statement  Heavy manual focus today due to reported R shoulder/back pain. Noted trigger points throughout shoulder and back musculature that both reproduce and refer pain. Paitent educated on lack of evidence for TDN for patients with muscular dystrophy, therefore did not perform TDN treatment today. WIll continue to progress as patient tolerates.     Rehab Potential  Good    PT Frequency  2x / week    PT Duration  4 weeks    PT Treatment/Interventions  ADLs/Self Care Home Management;Therapeutic exercise;Therapeutic activities;Functional mobility training;Stair training;Gait training;DME Instruction;Balance training;Neuromuscular re-education;Patient/family education;Manual techniques;Orthotic Fit/Training;Taping;Cryotherapy;Electrical Stimulation;Moist Heat;Iontophoresis 41m/ml Dexamethasone;Passive range of motion;Dry needling    PT Next Visit Plan  balance with L hip strengthening; HEP for shoulder (continue to review pt questions) - review and update.      PT Home Exercise Plan  [facility] [id number](given10/21/2019 )    Consulted and Agree with Plan of Care  Patient       Patient will benefit from skilled therapeutic intervention in order to improve the following deficits and impairments:  Decreased activity tolerance, Decreased balance, Decreased safety awareness, Decreased mobility, Decreased strength, Difficulty walking, Decreased range of motion, Pain, Abnormal gait  Visit Diagnosis: Unsteadiness on feet  Muscle weakness (generalized)  Other abnormalities of gait and mobility  Acute pain of right shoulder  Abnormal posture     Problem List Patient Active Problem List   Diagnosis Date Noted  . Abnormality of gait 12/02/2015  . Risk for falls 12/02/2015  . Sciatica neuralgia  06/10/2015  . Scoliosis (and kyphoscoliosis), idiopathic 06/10/2015  . [place] 07/05[place]2013  . Cervical dysplasia   . IBS (irritable bowel syndrome)   . Macular hole of left eye   . Hypertension   . Pseudocholinesterase deficiency   . [place]cular [place]   . CIN (conjunctival intraepithelial neoplasia)   . Osteoporosis   . Atrophic vaginitis      SLanney Gins PT, DPT Supplemental Physical Therapist 09/22/18 1:21 PM Pager: [phone]: [address] [address]Phone: [phone]  Fax:  [phone] Name: Catherine DEBRUYNMRN: [id number] of Birth: 107/22/1931

## 2018-09-26 ENCOUNTER — Ambulatory Visit: Payer: PPO | Admitting: Physical Therapy

## 2018-09-27 ENCOUNTER — Ambulatory Visit: Payer: PPO | Admitting: Physical Therapy

## 2018-09-29 ENCOUNTER — Ambulatory Visit: Payer: PPO | Admitting: Rehabilitation

## 2018-09-29 ENCOUNTER — Encounter: Payer: Self-pay | Admitting: Rehabilitation

## 2018-09-29 DIAGNOSIS — M6281 Muscle weakness (generalized): Secondary | ICD-10-CM

## 2018-09-29 DIAGNOSIS — R2681 Unsteadiness on feet: Secondary | ICD-10-CM

## 2018-09-29 DIAGNOSIS — R2689 Other abnormalities of gait and mobility: Secondary | ICD-10-CM

## 2018-09-29 NOTE — Therapy (Signed)
[facility] [address] [place], [place], [address] Phone: [phone]   Fax:  [phone]  Physical Therapy Treatment  Patient Details  Name: Catherine Harding MRN: [id number] Date of Birth: [birth year] Referring Provider (PT): Dennie Bible, NP   Encounter Date: 09/29/2018  PT End of Session - 09/29/18 1700    Visit Number  16    Number of Visits  25   per updated POC   Date for PT Re-Evaluation  11/13/18    Authorization Type  HTA    PT Start Time  0932    PT Stop Time  1015    PT Time Calculation (min)  43 min    Equipment Utilized During Treatment  Gait belt    Activity Tolerance  Patient tolerated treatment well    Behavior During Therapy  [facility] for tasks assessed/performed       Past Medical History:  Diagnosis Date  . Arthritis   . Atrophic vaginitis   . Basal cell carcinoma   . Cervical dysplasia   . Complication of anesthesia    pseudocholinesterase deficiency-hard to wake up  . Falls   . GERD (gastroesophageal reflux disease)   . Hypertension   . IBS (irritable bowel syndrome)   . Jaw atrophy   . 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 Macular hole of left eye   . Muscular dystrophy ([place])   . Osteoporosis    pelvic fracture  . Pseudocholinesterase deficiency   . Scoliosis     Past Surgical History:  Procedure Laterality Date  . ABDOMINAL HYSTERECTOMY     TAH BSO  . BREAST LUMPECTOMY  1971   benign  . CESAREAN SECTION     x2  . COLPOSCOPY    . EYE SURGERY  2003   left  . TOTAL ABDOMINAL HYSTERECTOMY W/ BILATERAL SALPINGOOPHORECTOMY  1981  . TRIGGER FINGER RELEASE  07/28/2012   Procedure: RELEASE TRIGGER FINGER/A-1 PULLEY;  Surgeon: Cammie Sickle., MD;  Location: [facility];  Service: Orthopedics;  Laterality: Right;  EXCISION CYST RIGHT LONG A-1 RELEASE A-1 RIGHT LONG     There were no vitals filed for this visit.  Subjective Assessment -  09/29/18 0936    Subjective  Pain in shoulder is better.  Is sleeping better. Reports manual therapy really helped.     Pertinent History  Limb-girdle MD, HTN, obesity, osteopenia    Patient Stated Goals  "I want to stay out of the wheelchair."    Currently in Pain?  Yes    Pain Score  4     Pain Location  Shoulder    Pain Orientation  Right    Pain Descriptors / Indicators  Aching;Discomfort    Pain Type  Acute pain    Pain Onset  In the past 7 days    Pain Frequency  Constant    Aggravating Factors   movement    Pain Relieving Factors  lying on heating pad                       [facility] Adult PT Treatment/Exercise - 09/29/18 0001      Self-Care   Self-Care  Other Self-Care Comments    Other Self-Care Comments   Spent time during session to problem solve safety with getting rollator in and out of car.  Pt reports she is unable to be independent with task due to inability to lift rollator.  Again,  discussed using RW during these times as her endurance is fairly good.  Pt reports that RW makes her look "disabled."  PT demonstrated during session how to lock rollator to prevent it from rolling away, holding onto car with RUE and liting and sliding into back of car with LUE.  Had pt attempt to do this, however she seemed very anxious and did not like doing in this manner.  Attempted to go out to her car, however daughter in law had gone to run errands.  Also recommend she call family (wants to go to see daughter's program at school) to help her once she is there.  Pt verbalized understanding.       Neuro Re-ed    Neuro Re-ed Details   Corner balance on compliant surface: feet apart EO maintaining balance x 20 secs x 2 reps, feet together EO with head turns x 10 reps side/side and up/down, feet together EC x 2 sets of 20 secs. Note that she tends to have anterior LOB.  Therefore progressed to standing on ramp facing decline on maintaining balance with feet apart EO x 20 secs, feet  staggered x 20 secs each direction.  Then on blue mat on ramp in same orientation with feet apart EO x 2 sets of 20 secs, feet staggered x 20 secs each direction and finally performing posterior weight shift at hip x 10 reps (forward movement of arms on walker).  Pt with increased anxiety with tasks on ramp during session.              PT Education - 09/29/18 0937    Education Details  see self care     Person(s) Educated  Patient    Methods  Explanation    Comprehension  Verbalized understanding       PT Short Term Goals - 08/31/18 1302      PT SHORT TERM GOAL #1   Title  patient to be independent with initial HEP     Baseline  08/14/18 Met.    Time  4    Period  Weeks    Status  Achieved      PT SHORT TERM GOAL #2   Title  patient to improve BERG by >/= 4 points demonstrating improved balance    Baseline  08/14/18,  39/56, Met.    Time  4    Period  Weeks    Status  Achieved      PT SHORT TERM GOAL #3   Title  patient to improve TUG by >/= 3 seconds    Baseline  08/14/18, 12.38 seconds with rollator, Met.     Time  4    Period  Weeks    Status  Achieved      PT SHORT TERM GOAL #4   Title  patient to demonstrate gait of >/= 400' with LRAD over level indoor/outdoor surfaces including curbs/inclines with appropriate safety awareness and without LOB    Baseline  08/31/18: met today with rollator.     Time  4    Period  Weeks    Status  Achieved        PT Long Term Goals - 09/14/18 1239      PT LONG TERM GOAL #1   Title  patient to be independent with advanced HEP (Updated STG Date: 10/14/2018)    Time  4    Period  Weeks    Status  On-going    Target Date  10/14/18  PT LONG TERM GOAL #2   Title  patient to improve gait speed to >/= 3.0 ft/sec wtih LRAD demonstrating improved functional mobility    Baseline  09/12/18: 2.36 ft/sec    Time  8    Period  Weeks    Status  Revised      PT LONG TERM GOAL #3   Title  patient to improve BERG to 44/56  demonstrating reduced fall risk    Baseline  09/12/18: 40/56    Time  8    Period  Weeks    Status  Achieved      PT LONG TERM GOAL #4   Title  patient to demonstrate gait of >/=800' over various indoor and outdoor surfaces without LOB or overt instability with LRAD    Baseline  09/14/18: met today with rollator    Time  8    Period  Weeks    Status  Achieved      PT LONG TERM GOAL #5   Title  Pt will improve TUG to </=14 secs with rollator in order to indicate decreased fall risk.     Baseline  09/12/18: 17.47 sec    Time  8    Period  Weeks    Status  Revised      PT LONG TERM GOAL #6   Title  patient to demonstrate improved AROM at L shoulder flexion and abduction by >/= 10 degrees needed for improved function    Baseline  09/14/18: flexion: 79 degrees; abduction: 3 degrees. Pt unable to meet due to recent fall and injury to right shoulder.    Status  On-going      PT LONG TERM GOAL #7   Title  patient to report pain reduction by >/= 50% at R shoulder for greater than 2 weeks    Baseline  09/14/18: Current pain 8/10 today due to recent fall and injury of shoulder, however pt reports improved pain prior to incident.     Status  On-going      PT LONG TERM GOAL #8   Title  patient to demonstrate improved function at R shoulder (putting on coat, reaching, light lifting) feeded for ADLs and home tasks    Baseline  09/14/18: not met today due to current fall and injury of shoulder, however pt reports improved function prior to incident.    Status  On-going            Plan - 09/29/18 1300    Clinical Impression Statement  Pt reporting shoulder is much better and would like to work on balance.  Continue to address balance on compliant surfaces and with eyes closed as this is very challenging.  Pt with difficulty initiating hip strategy and also noted to lose balance anteriorly, therefore addressed during session.      Rehab Potential  Good    PT Frequency  2x / week    PT Duration   4 weeks    PT Treatment/Interventions  ADLs/Self Care Home Management;Therapeutic exercise;Therapeutic activities;Functional mobility training;Stair training;Gait training;DME Instruction;Balance training;Neuromuscular re-education;Patient/family education;Manual techniques;Orthotic Fit/Training;Taping;Cryotherapy;Electrical Stimulation;Moist Heat;Iontophoresis 68m/ml Dexamethasone;Passive range of motion;Dry needling    PT Next Visit Plan  hip and stepping strategy, balance with L hip strengthening; HEP for shoulder (continue to review pt questions) - review and update.      PT H[place] [id number](given10/21/2019 )    Consulted and Agree with Plan of Care  Patient       Patient  will benefit from skilled therapeutic intervention in order to improve the following deficits and impairments:  Decreased activity tolerance, Decreased balance, Decreased safety awareness, Decreased mobility, Decreased strength, Difficulty walking, Decreased range of motion, Pain, Abnormal gait  Visit Diagnosis: Unsteadiness on feet  Muscle weakness (generalized)  Other abnormalities of gait and mobility     Problem List Patient Active Problem List   Diagnosis Date Noted  . Abnormality of gait 12/02/2015  . Risk for falls 12/02/2015  . Sciatica neuralgia 06/10/2015  . Scoliosis (and kyphoscoliosis), idiopathic 06/10/2015  . 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 Cervical dysplasia   . IBS (irritable bowel syndrome)   . Macular hole of left eye   . Hypertension   . Pseudocholinesterase deficiency   . Muscular dystrophy ([place])   . CIN (conjunctival intraepithelial neoplasia)   . Osteoporosis   . Atrophic vaginitis     Cameron Sprang, PT, MPT [facility] [address] [place], [place], [address] Phone: [phone]   Fax:  [phone] 09/29/18, 1:02 PM  Name: NAHDIA DOUCET MRN: [id number] Date of Birth: 11/17/1929

## 2018-10-02 ENCOUNTER — Encounter: Payer: Self-pay | Admitting: Rehabilitation

## 2018-10-02 ENCOUNTER — Ambulatory Visit: Payer: PPO | Admitting: Rehabilitation

## 2018-10-02 DIAGNOSIS — R2681 Unsteadiness on feet: Secondary | ICD-10-CM

## 2018-10-02 DIAGNOSIS — R2689 Other abnormalities of gait and mobility: Secondary | ICD-10-CM

## 2018-10-02 DIAGNOSIS — R293 Abnormal posture: Secondary | ICD-10-CM

## 2018-10-02 DIAGNOSIS — M6281 Muscle weakness (generalized): Secondary | ICD-10-CM

## 2018-10-02 NOTE — Therapy (Signed)
[facility] [address] [place], [place], [address] Phone: [phone]   Fax:  [phone]  Physical Therapy Treatment  Patient Details  Name: Catherine Harding MRN: [id number] Date of Birth: 1929-10-28 Referring Provider (PT): Dennie Bible, NP   Encounter Date: 10/02/2018  PT End of Session - 10/02/18 1111    Visit Number  17    Number of Visits  25   per updated POC   Date for PT Re-Evaluation  11/13/18    Authorization Type  HTA    PT Start Time  1108   pt late to session   PT Stop Time  1146    PT Time Calculation (min)  38 min    Equipment Utilized During Treatment  Gait belt    Activity Tolerance  Patient tolerated treatment well    Behavior During Therapy  [facility] for tasks assessed/performed       Past Medical History:  Diagnosis Date  . Arthritis   . Atrophic vaginitis   . Basal cell carcinoma   . Cervical dysplasia   . Complication of anesthesia    pseudocholinesterase deficiency-hard to wake up  . Falls   . GERD (gastroesophageal reflux disease)   . Hypertension   . IBS (irritable bowel syndrome)   . Jaw atrophy   . 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 Macular hole of left eye   . Muscular dystrophy ([place])   . Osteoporosis    pelvic fracture  . Pseudocholinesterase deficiency   . Scoliosis     Past Surgical History:  Procedure Laterality Date  . ABDOMINAL HYSTERECTOMY     TAH BSO  . BREAST LUMPECTOMY  1971   benign  . CESAREAN SECTION     x2  . COLPOSCOPY    . EYE SURGERY  2003   left  . TOTAL ABDOMINAL HYSTERECTOMY W/ BILATERAL SALPINGOOPHORECTOMY  1981  . TRIGGER FINGER RELEASE  07/28/2012   Procedure: RELEASE TRIGGER FINGER/A-1 PULLEY;  Surgeon: Cammie Sickle., MD;  Location: [facility];  Service: Orthopedics;  Laterality: Right;  EXCISION CYST RIGHT LONG A-1 RELEASE A-1 RIGHT LONG     There were no vitals filed for this  visit.  Subjective Assessment - 10/02/18 1110    Subjective  Pt reports legs are weak today.  Feels like its the weather.     Pertinent History  Limb-girdle MD, HTN, obesity, osteopenia    Patient Stated Goals  "I want to stay out of the wheelchair."    Currently in Pain?  Yes    Pain Score  6     Pain Location  Shoulder    Pain Orientation  Right    Pain Descriptors / Indicators  Aching;Discomfort    Pain Type  Acute pain    Pain Onset  1 to 4 weeks ago    Pain Frequency  Constant    Aggravating Factors   movement    Pain Relieving Factors  lying on heating pad                        [facility] Adult PT Treatment/Exercise - 10/02/18 1127      Self-Care   Self-Care  Other Self-Care Comments    Other Self-Care Comments   Continue to educate on safe negotiation in home.  She reports she is using single or double SPCs to get from house to garage through [place].  Had pt perform gait  with single SPC during session.  She initially was very unsteady and taking very short steps with cane too far ahead of her.  Educated on increasing step length bilaterally and correct placement with pt able to ambulate with improved safety, but still have concern with her doing this by herself at home.  Continue to recommend she use walker when able.  Also discussed that her POC would be ending next week (1/4).  Pt verbalized that she would need to find the motivation to continue with her exercises and fitness.  Recommended [employer] vs [employer].  Pt reports she is familiar with [facility] and would be willing to do again.  High recommended using their personal trainers to get set up on equipment and proper use and getting on a gym program.  Pt verbalized understanding.        Neuro Re-ed    Neuro Re-ed Details   Standing at counter top for LUE support and intermittent R HHA from PT: tandem walking x 4 laps, marching forwards x 4 laps, backwards walking x 4 laps, alternating  cone taps x 10 reps each leg, forward/retro stepping over smaller orange barrier x 10 reps each side with emphasis on improved L hip control.  Pt with increased SOB during tasks, however feel that a lot of this is due to being anxious with balance challenges.  Progressed to standing on ramp facing incline while standing on therapy mat.  Maintaining balance with feet apart x 20 secs EO, retro stepping alternating LEs x 10 reps, marching in place x 10 reps each, staggered stance maintaining balance x 20 secs with EO and 20 secs with EC each direction.  Pt extremely fearful of falling during EC tasks.                 PT Short Term Goals - 08/31/18 1302      PT SHORT TERM GOAL #1   Title  patient to be independent with initial HEP     Baseline  08/14/18 Met.    Time  4    Period  Weeks    Status  Achieved      PT SHORT TERM GOAL #2   Title  patient to improve BERG by >/= 4 points demonstrating improved balance    Baseline  08/14/18,  39/56, Met.    Time  4    Period  Weeks    Status  Achieved      PT SHORT TERM GOAL #3   Title  patient to improve TUG by >/= 3 seconds    Baseline  08/14/18, 12.38 seconds with rollator, Met.     Time  4    Period  Weeks    Status  Achieved      PT SHORT TERM GOAL #4   Title  patient to demonstrate gait of >/= 400' with LRAD over level indoor/outdoor surfaces including curbs/inclines with appropriate safety awareness and without LOB    Baseline  08/31/18: met today with rollator.     Time  4    Period  Weeks    Status  Achieved        PT Long Term Goals - 09/14/18 1239      PT LONG TERM GOAL #1   Title  patient to be independent with advanced HEP (Updated STG Date: 10/14/2018)    Time  4    Period  Weeks    Status  On-going    Target Date  10/14/18      PT LONG TERM GOAL #2   Title  patient to improve gait speed to >/= 3.0 ft/sec wtih LRAD demonstrating improved functional mobility    Baseline  09/12/18: 2.36 ft/sec    Time  8    Period   Weeks    Status  Revised      PT LONG TERM GOAL #3   Title  patient to improve BERG to 44/56 demonstrating reduced fall risk    Baseline  09/12/18: 40/56    Time  8    Period  Weeks    Status  Achieved      PT LONG TERM GOAL #4   Title  patient to demonstrate gait of >/=800' over various indoor and outdoor surfaces without LOB or overt instability with LRAD    Baseline  09/14/18: met today with rollator    Time  8    Period  Weeks    Status  Achieved      PT LONG TERM GOAL #5   Title  Pt will improve TUG to </=14 secs with rollator in order to indicate decreased fall risk.     Baseline  09/12/18: 17.47 sec    Time  8    Period  Weeks    Status  Revised      PT LONG TERM GOAL #6   Title  patient to demonstrate improved AROM at L shoulder flexion and abduction by >/= 10 degrees needed for improved function    Baseline  09/14/18: flexion: 79 degrees; abduction: 3 degrees. Pt unable to meet due to recent fall and injury to right shoulder.    Status  On-going      PT LONG TERM GOAL #7   Title  patient to report pain reduction by >/= 50% at R shoulder for greater than 2 weeks    Baseline  09/14/18: Current pain 8/10 today due to recent fall and injury of shoulder, however pt reports improved pain prior to incident.     Status  On-going      PT LONG TERM GOAL #8   Title  patient to demonstrate improved function at R shoulder (putting on coat, reaching, light lifting) feeded for ADLs and home tasks    Baseline  09/14/18: not met today due to current fall and injury of shoulder, however pt reports improved function prior to incident.    Status  On-going            Plan - 10/02/18 1257    Clinical Impression Statement  Skilled session continues to focus on high level balance, compliant and inclined sufaces, exercises with challenge in SLS for L hip stability.  Pt very fearful when placed in challenging tasks.  Continue to educate on safe use of AD at home/community.  Will plan to DC  next week to allow pt to get into community fitness program.     Rehab Potential  Good    PT Frequency  2x / week    PT Duration  4 weeks    PT Treatment/Interventions  ADLs/Self Care Home Management;Therapeutic exercise;Therapeutic activities;Functional mobility training;Stair training;Gait training;DME Instruction;Balance training;Neuromuscular re-education;Patient/family education;Manual techniques;Orthotic Fit/Training;Taping;Cryotherapy;Electrical Stimulation;Moist Heat;Iontophoresis 73m/ml Dexamethasone;Passive range of motion;Dry needling    PT Next Visit Plan  [place] 1/2    [place] [id number](given10/21/2019 )    Consulted and Agree with Plan of Care  Patient       Patient will  benefit from skilled therapeutic intervention in order to improve the following deficits and impairments:  Decreased activity tolerance, Decreased balance, Decreased safety awareness, Decreased mobility, Decreased strength, Difficulty walking, Decreased range of motion, Pain, Abnormal gait  Visit Diagnosis: Unsteadiness on feet  Muscle weakness (generalized)  Other abnormalities of gait and mobility  Abnormal posture     Problem List Patient Active Problem List   Diagnosis Date Noted  . Abnormality of gait 12/02/2015  . Risk for falls 12/02/2015  . Sciatica neuralgia 06/10/2015  . Scoliosis (and kyphoscoliosis), idiopathic 06/10/2015  . 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 Cervical dysplasia   . IBS (irritable bowel syndrome)   . Macular hole of left eye   . Hypertension   . Pseudocholinesterase deficiency   . Muscular dystrophy ([place])   . CIN (conjunctival intraepithelial neoplasia)   . Osteoporosis   . Atrophic vaginitis     Cameron Sprang, PT, MPT [facility] [address] [place], [place], [address] Phone: [phone]   Fax:  [phone] 10/02/18, 1:00 PM  Name: Catherine Harding MRN: [id number] Date of Birth: 26-Apr-1930

## 2018-10-10 ENCOUNTER — Ambulatory Visit: Payer: PPO | Admitting: Physical Therapy

## 2018-10-10 ENCOUNTER — Encounter: Payer: Self-pay | Admitting: Physical Therapy

## 2018-10-10 DIAGNOSIS — M6281 Muscle weakness (generalized): Secondary | ICD-10-CM

## 2018-10-10 DIAGNOSIS — R2689 Other abnormalities of gait and mobility: Secondary | ICD-10-CM

## 2018-10-10 DIAGNOSIS — R2681 Unsteadiness on feet: Secondary | ICD-10-CM | POA: Diagnosis not present

## 2018-10-10 NOTE — Therapy (Signed)
[facility] [address] [place], [place], [address] Phone: [phone]   Fax:  [phone]  Physical Therapy Treatment  Patient Details  Name: Catherine Harding MRN: [id number] Date of Birth: 1930-07-20 Referring Provider (PT): Dennie Bible, NP   Encounter Date: 10/10/2018  PT End of Session - 10/10/18 5686    Visit Number  18    Number of Visits  25   per updated POC   Date for PT Re-Evaluation  11/13/18    Authorization Type  HTA    PT Start Time  0935    PT Stop Time  1015    PT Time Calculation (min)  40 min    Equipment Utilized During Treatment  Gait belt    Activity Tolerance  Patient tolerated treatment well    Behavior During Therapy  [facility] for tasks assessed/performed       Past Medical History:  Diagnosis Date  . Arthritis   . Atrophic vaginitis   . Basal cell carcinoma   . Cervical dysplasia   . Complication of anesthesia    pseudocholinesterase deficiency-hard to wake up  . Falls   . GERD (gastroesophageal reflux disease)   . Hypertension   . IBS (irritable bowel syndrome)   . Jaw atrophy   . 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 Macular hole of left eye   . Muscular dystrophy ([place])   . Osteoporosis    pelvic fracture  . Pseudocholinesterase deficiency   . Scoliosis     Past Surgical History:  Procedure Laterality Date  . ABDOMINAL HYSTERECTOMY     TAH BSO  . BREAST LUMPECTOMY  1971   benign  . CESAREAN SECTION     x2  . COLPOSCOPY    . EYE SURGERY  2003   left  . TOTAL ABDOMINAL HYSTERECTOMY W/ BILATERAL SALPINGOOPHORECTOMY  1981  . TRIGGER FINGER RELEASE  07/28/2012   Procedure: RELEASE TRIGGER FINGER/A-1 PULLEY;  Surgeon: Cammie Sickle., MD;  Location: [facility];  Service: Orthopedics;  Laterality: Right;  EXCISION CYST RIGHT LONG A-1 RELEASE A-1 RIGHT LONG     There were no vitals filed for this visit.  Subjective Assessment -  10/10/18 0935    Subjective  No new complaints. No falls. Continues to have shoulder pain only.     Pertinent History  Limb-girdle MD, HTN, obesity, osteopenia    Patient Stated Goals  "I want to stay out of the wheelchair."    Currently in Pain?  Yes    Pain Score  2     Pain Location  Shoulder    Pain Orientation  Right    Pain Descriptors / Indicators  Aching;Discomfort    Pain Type  Chronic pain    Pain Onset  More than a month ago    Pain Frequency  Constant    Aggravating Factors   movement    Pain Relieving Factors  lying on heating pad         [facility] PT Assessment - 10/10/18 0940      Ambulation/Gait   Ambulation/Gait  Yes    Ambulation/Gait Assistance  6: Modified independent (Device/Increase time)    Ambulation/Gait Assistance Details  no balance issues noted.     Ambulation Distance (Feet)  800 Feet   x1, plus around gym with testing   Assistive device  Rollator    Gait Pattern  Step-through pattern;Decreased stride length;Decreased hip/knee flexion - right;Decreased hip/knee flexion -  left;Trunk flexed    Ambulation Surface  Level;Unlevel;Indoor;Outdoor;Paved    Gait velocity  9.81 sec's= 3.43 ft/sec with rollator      Berg Balance Test   Sit to Stand  Able to stand without using hands and stabilize independently    Standing Unsupported  Able to stand safely 2 minutes    Sitting with Back Unsupported but Feet Supported on Floor or Stool  Able to sit safely and securely 2 minutes    Stand to Sit  Sits safely with minimal use of hands    Transfers  Able to transfer safely, minor use of hands    Standing Unsupported with Eyes Closed  Able to stand 10 seconds safely    Standing Ubsupported with Feet Together  Able to place feet together independently and stand for 1 minute with supervision    From Standing, Reach Forward with Outstretched Arm  Can reach forward >12 cm safely (5")   7 inches   From Standing Position, Pick up Object from [place] to pick up shoe  safely and easily    From Standing Position, Turn to Look Behind Over each Shoulder  Looks behind from both sides and weight shifts well    Turn 360 Degrees  Able to turn 360 degrees safely but slowly   >8 sec's both ways, no balance issues   Standing Unsupported, Alternately Place Feet on Step/Stool  Able to complete >2 steps/needs minimal assist    Standing Unsupported, One Foot in Front  Able to take small step independently and hold 30 seconds    Standing on One Leg  Tries to lift leg/unable to hold 3 seconds but remains standing independently    Total Score  44      Timed Up and Go Test   TUG  Normal TUG    Normal TUG (seconds)  13.59   with rollator           PT Short Term Goals - 08/31/18 1302      PT SHORT TERM GOAL #1   Title  patient to be independent with initial HEP     Baseline  08/14/18 Met.    Time  4    Period  Weeks    Status  Achieved      PT SHORT TERM GOAL #2   Title  patient to improve BERG by >/= 4 points demonstrating improved balance    Baseline  08/14/18,  39/56, Met.    Time  4    Period  Weeks    Status  Achieved      PT SHORT TERM GOAL #3   Title  patient to improve TUG by >/= 3 seconds    Baseline  08/14/18, 12.38 seconds with rollator, Met.     Time  4    Period  Weeks    Status  Achieved      PT SHORT TERM GOAL #4   Title  patient to demonstrate gait of >/= 400' with LRAD over level indoor/outdoor surfaces including curbs/inclines with appropriate safety awareness and without LOB    Baseline  08/31/18: met today with rollator.     Time  4    Period  Weeks    Status  Achieved        PT Long Term Goals - 10/10/18 9449      PT LONG TERM GOAL #1   Title  patient to be independent with advanced HEP (Updated STG Date: 10/14/2018)    Time  4    Period  Weeks    Status  On-going      PT LONG TERM GOAL #2   Title  patient to improve gait speed to >/= 3.0 ft/sec wtih LRAD demonstrating improved functional mobility    Baseline  10/10/18:  3.43 ft/sec with rollator    Time  --    Period  --    Status  Achieved      PT LONG TERM GOAL #3   Title  patient to improve BERG to 44/56 demonstrating reduced fall risk    Baseline  10/10/18: 44/56 scored today    Time  --    Period  --    Status  Achieved      PT LONG TERM GOAL #4   Title  patient to demonstrate gait of >/=800' over various indoor and outdoor surfaces without LOB or overt instability with LRAD    Baseline  10/10/18: met today with rollator    Time  --    Period  --    Status  Achieved      PT LONG TERM GOAL #5   Title  Pt will improve TUG to </=14 secs with rollator in order to indicate decreased fall risk.     Baseline  10/10/18: 13.59 sec's with rollator    Time  --    Period  --    Status  Achieved      PT LONG TERM GOAL #6   Title  patient to demonstrate improved AROM at L shoulder flexion and abduction by >/= 10 degrees needed for improved function    Baseline  09/14/18: flexion: 79 degrees; abduction: 3 degrees. Pt unable to meet due to recent fall and injury to right shoulder.    Status  On-going      PT LONG TERM GOAL #7   Title  patient to report pain reduction by >/= 50% at R shoulder for greater than 2 weeks    Baseline  10/10/18: was at 8-9/10, now reports around 2-3/10    Status  Achieved      PT LONG TERM GOAL #8   Title  patient to demonstrate improved function at R shoulder (putting on coat, reaching, light lifting) feeded for ADLs and home tasks    Baseline  10/10/18: met per pt report today.     Status  Achieved            Plan - 10/10/18 0937    Clinical Impression Statement  Today's skilled session focused on progress toward LTGs with 6/6 goals checked today met. The pt has improved her Berg Balance test score to 44/56, her Timed up and Go decreased to 13.59 sec with rollator and her gait speed increased to 3.43 ft/sec with rollator. Will check remaining LTGs at next visit for anticpated discharge.     Rehab Potential  Good     PT Frequency  2x / week    PT Duration  4 weeks    PT Treatment/Interventions  ADLs/Self Care Home Management;Therapeutic exercise;Therapeutic activities;Functional mobility training;Stair training;Gait training;DME Instruction;Balance training;Neuromuscular re-education;Patient/family education;Manual techniques;Orthotic Fit/Training;Taping;Cryotherapy;Electrical Stimulation;Moist Heat;Iontophoresis 57m/ml Dexamethasone;Passive range of motion;Dry needling    PT Next Visit Plan  check remaining LTGs for anticipated discharge    PT [place] [id number](given10/21/2019 )    Consulted and Agree with Plan of Care  Patient       Patient will benefit from skilled therapeutic intervention in order to improve the following deficits  and impairments:  Decreased activity tolerance, Decreased balance, Decreased safety awareness, Decreased mobility, Decreased strength, Difficulty walking, Decreased range of motion, Pain, Abnormal gait  Visit Diagnosis: Unsteadiness on feet  Muscle weakness (generalized)  Other abnormalities of gait and mobility     Problem List Patient Active Problem List   Diagnosis Date Noted  . Abnormality of gait 12/02/2015  . Risk for falls 12/02/2015  . Sciatica neuralgia 06/10/2015  . Scoliosis (and kyphoscoliosis), idiopathic 06/10/2015  . 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 Cervical dysplasia   . IBS (irritable bowel syndrome)   . Macular hole of left eye   . Hypertension   . Pseudocholinesterase deficiency   . Muscular dystrophy ([place])   . CIN (conjunctival intraepithelial neoplasia)   . Osteoporosis   . Atrophic vaginitis     Willow Ora, PTA, [facility] [address], [place], [place] [address] [phone] 10/10/18, 2:45 PM   Name: SHAWNIKA PEPIN MRN: [id number] Date of Birth: [birth year]

## 2018-10-12 ENCOUNTER — Ambulatory Visit: Payer: PPO | Attending: Endocrinology | Admitting: Physical Therapy

## 2018-10-12 ENCOUNTER — Encounter: Payer: Self-pay | Admitting: Physical Therapy

## 2018-10-12 DIAGNOSIS — M6281 Muscle weakness (generalized): Secondary | ICD-10-CM | POA: Diagnosis not present

## 2018-10-12 DIAGNOSIS — R293 Abnormal posture: Secondary | ICD-10-CM | POA: Insufficient documentation

## 2018-10-12 DIAGNOSIS — R2681 Unsteadiness on feet: Secondary | ICD-10-CM | POA: Diagnosis not present

## 2018-10-12 DIAGNOSIS — R2689 Other abnormalities of gait and mobility: Secondary | ICD-10-CM | POA: Diagnosis not present

## 2018-10-12 NOTE — Therapy (Signed)
[facility] [address] [place], [place], [address] Phone: [phone]   Fax:  [phone]  Physical Therapy Treatment  Patient Details  Name: Catherine Harding MRN: [id number] Date of Birth: Aug 26, 1930 Referring Provider (PT): Dennie Bible, NP   Encounter Date: 10/12/2018  PT End of Session - 10/12/18 0851    Visit Number  19    Number of Visits  25   per updated POC   Date for PT Re-Evaluation  11/13/18    Authorization Type  HTA    PT Start Time  0849    PT Stop Time  0929    PT Time Calculation (min)  40 min    Equipment Utilized During Treatment  Gait belt    Activity Tolerance  Patient tolerated treatment well    Behavior During Therapy  [facility] for tasks assessed/performed       Past Medical History:  Diagnosis Date  . Arthritis   . Atrophic vaginitis   . Basal cell carcinoma   . Cervical dysplasia   . Complication of anesthesia    pseudocholinesterase deficiency-hard to wake up  . Falls   . GERD (gastroesophageal reflux disease)   . Hypertension   . IBS (irritable bowel syndrome)   . Jaw atrophy   . 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 Macular hole of left eye   . Muscular dystrophy ([place])   . Osteoporosis    pelvic fracture  . Pseudocholinesterase deficiency   . Scoliosis     Past Surgical History:  Procedure Laterality Date  . ABDOMINAL HYSTERECTOMY     TAH BSO  . BREAST LUMPECTOMY  1971   benign  . CESAREAN SECTION     x2  . COLPOSCOPY    . EYE SURGERY  2003   left  . TOTAL ABDOMINAL HYSTERECTOMY W/ BILATERAL SALPINGOOPHORECTOMY  1981  . TRIGGER FINGER RELEASE  07/28/2012   Procedure: RELEASE TRIGGER FINGER/A-1 PULLEY;  Surgeon: Cammie Sickle., MD;  Location: [place];  Service: Orthopedics;  Laterality: Right;  EXCISION CYST RIGHT LONG A-1 RELEASE A-1 RIGHT LONG     There were no vitals filed for this visit.  Subjective Assessment -  10/12/18 0850    Subjective  To therapy today with standard RW. No falls.    Pertinent History  Limb-girdle MD, HTN, obesity, osteopenia    Patient Stated Goals  "I want to stay out of the wheelchair."    Currently in Pain?  Yes    Pain Score  2     Pain Location  Shoulder    Pain Orientation  Right    Pain Descriptors / Indicators  Aching;Discomfort    Pain Onset  More than a month ago    Pain Frequency  Constant    Aggravating Factors   certain movements    Pain Relieving Factors  lying on heating pad         [facility] PT Assessment - 10/12/18 0906      AROM   AROM Assessment Site  Shoulder    Right/Left Shoulder  Right    Right Shoulder Flexion  72 Degrees   with pain at end range   Right Shoulder ABduction  30 Degrees   pure abduction plane with pain at end range             [facility] Adult PT Treatment/Exercise - 10/12/18 0910      Self-Care   Self-Care  Other Self-Care  Comments    Other Self-Care Comments   educated pt on need to replace tips on RW due to worn down. Provided pt with printout from [employer] on walker tips and provided pt with new tennis balls already cut as well.       Exercises   Exercises  Other Exercises    Other Exercises   reviewed all HEP issued to date- both medbridge programs and shoulder ex's. pt performed those she had a question on with cues for proper technique.              PT Education - 10/12/18 [phone]    Education Details  reviewed HEP issued to date with pt performing ones she has questions on; reissued shoulder HEP    Person(s) Educated  Patient    Methods  Explanation;Demonstration;Verbal cues;Handout    Comprehension  Verbalized understanding;Returned demonstration;Verbal cues required;Need further instruction       PT Short Term Goals - 08/31/18 1302      PT SHORT TERM GOAL #1   Title  patient to be independent with initial HEP     Baseline  08/14/18 Met.    Time  4    Period  Weeks    Status  Achieved      PT SHORT  TERM GOAL #2   Title  patient to improve BERG by >/= 4 points demonstrating improved balance    Baseline  08/14/18,  39/56, Met.    Time  4    Period  Weeks    Status  Achieved      PT SHORT TERM GOAL #3   Title  patient to improve TUG by >/= 3 seconds    Baseline  08/14/18, 12.38 seconds with rollator, Met.     Time  4    Period  Weeks    Status  Achieved      PT SHORT TERM GOAL #4   Title  patient to demonstrate gait of >/= 400' with LRAD over level indoor/outdoor surfaces including curbs/inclines with appropriate safety awareness and without LOB    Baseline  08/31/18: met today with rollator.     Time  4    Period  Weeks    Status  Achieved        PT Long Term Goals - 10/12/18 9702      PT LONG TERM GOAL #1   Title  patient to be independent with advanced HEP (Updated STG Date: 10/14/2018)    Baseline  10/12/17: met with current program    Time  --    Period  --    Status  Achieved      PT LONG TERM GOAL #2   Title  patient to improve gait speed to >/= 3.0 ft/sec wtih LRAD demonstrating improved functional mobility    Baseline  10/10/18: 3.43 ft/sec with rollator    Status  Achieved      PT LONG TERM GOAL #3   Title  patient to improve BERG to 44/56 demonstrating reduced fall risk    Baseline  10/10/18: 44/56 scored today    Status  Achieved      PT LONG TERM GOAL #4   Title  patient to demonstrate gait of >/=800' over various indoor and outdoor surfaces without LOB or overt instability with LRAD    Baseline  10/10/18: met today with rollator    Status  Achieved      PT LONG TERM GOAL #5   Title  Pt will improve  TUG to </=14 secs with rollator in order to indicate decreased fall risk.     Baseline  10/10/18: 13.59 sec's with rollator    Status  Achieved      PT LONG TERM GOAL #6   Title  patient to demonstrate improved AROM at L shoulder flexion and abduction by >/= 10 degrees needed for improved function    Baseline  10/12/18: flexion 72 degrees (was 79 at last  check), abduction 30 degrees, improved from 3 degrees at last check    Status  Partially Met      PT LONG TERM GOAL #7   Title  patient to report pain reduction by >/= 50% at R shoulder for greater than 2 weeks    Baseline  10/10/18: was at 8-9/10, now reports around 2-3/10    Status  Achieved      PT LONG TERM GOAL #8   Title  patient to demonstrate improved function at R shoulder (putting on coat, reaching, light lifting) feeded for ADLs and home tasks    Baseline  10/10/18: met per pt report today.     Status  Achieved            Plan - 10/12/18 0851    Clinical Impression Statement  Today's skilled session focused on remaining LTGs for discharge with pt meeting her HEP goal, partially meeting the shoulder AROM goal (improved abdcution, not felxion from last assessment). Also provided pt with ordering information for new rubber tips for standard RW so she can switch to intact tennis balls (hers are completely worn out). Pt agreed with discharge today.     Rehab Potential  Good    PT Frequency  2x / week    PT Duration  4 weeks    PT Treatment/Interventions  ADLs/Self Care Home Management;Therapeutic exercise;Therapeutic activities;Functional mobility training;Stair training;Gait training;DME Instruction;Balance training;Neuromuscular re-education;Patient/family education;Manual techniques;Orthotic Fit/Training;Taping;Cryotherapy;Electrical Stimulation;Moist Heat;Iontophoresis 44m/ml Dexamethasone;Passive range of motion;Dry needling    PT Next Visit Plan  discharge today    PT HGranburyand [id number](given10/21/2019 )    Consulted and Agree with Plan of Care  Patient       Patient will benefit from skilled therapeutic intervention in order to improve the following deficits and impairments:  Decreased activity tolerance, Decreased balance, Decreased safety awareness, Decreased mobility, Decreased strength, Difficulty walking, Decreased range of motion, Pain,  Abnormal gait  Visit Diagnosis: Unsteadiness on feet  Muscle weakness (generalized)  Other abnormalities of gait and mobility  Abnormal posture     Problem List Patient Active Problem List   Diagnosis Date Noted  . Abnormality of gait 12/02/2015  . Risk for falls 12/02/2015  . Sciatica neuralgia 06/10/2015  . Scoliosis (and kyphoscoliosis), idiopathic 06/10/2015  . [place] 07/05/2013  . Cervical dysplasia   . IBS (irritable bowel syndrome)   . Macular hole of left eye   . Hypertension   . Pseudocholinesterase deficiency   . [place]cular dystrophy ([place]   . CIN (conjunctival intraepithelial neoplasia)   . Osteoporosis   . Atrophic vaginitis     KWillow Ora PTA, C[facility] SLa [place]ridge[place]oor[place] [id number]/02/20, 12:46 PM   Name: Catherine ISEMANMRN: [id number] of Birth: [birth year]

## 2018-10-12 NOTE — Patient Instructions (Signed)
Flexion (Assistive)   Clasp hands together and raise arms above head, keeping elbows as straight as possible. Can be done sitting or lying. Repeat __10__ times. Do __2__ sessions per day.  External Rotation (Eccentric), Active-Assist - Supine (Cane)   Lie on back, affected arm out from side, elbow at 90, forearm forward. Use cane to assist in lifting forearm of affected arm to neutral. Slowly lower for 3-5 seconds. _10__ reps per set, _2__ sets per day Have towel rolled under elbow  Cane Exercise: Abduction   Hold cane with right hand over end, palm-up, with other hand palm-down. Move arm out from side and up by pushing with other arm. Hold __3__ seconds. Repeat __10__ times. Do __2__ sessions per day.  Strengthening: Isometric Flexion  Using wall for resistance, press right fist into ball using light pressure. Hold __5__ seconds. Repeat _10___ times per set.   SHOULDER: Abduction (Isometric)  Use wall as resistance. Press arm against pillow. Keep elbow straight. Hold _5__ seconds. _10__ reps per set  Extension (Isometric)  Place left bent elbow and back of arm against wall. Press elbow against wall. Hold __10__ seconds. Repeat __5__ times.   Internal Rotation (Isometric)  Place palm of right fist against door frame, with elbow bent. Press fist against door frame. Hold __5__ seconds. Repeat _10___ times.  External Rotation (Isometric)  Place back of left fist against door frame, with elbow bent. Press fist against door frame. Hold __5__ seconds. Repeat __10__ times.

## 2018-10-16 ENCOUNTER — Ambulatory Visit: Payer: PPO | Admitting: Rehabilitation

## 2018-10-18 ENCOUNTER — Ambulatory Visit: Payer: PPO | Admitting: Physical Therapy

## 2018-10-18 DIAGNOSIS — M25511 Pain in right shoulder: Secondary | ICD-10-CM | POA: Diagnosis not present

## 2018-11-01 DIAGNOSIS — R7301 Impaired fasting glucose: Secondary | ICD-10-CM | POA: Diagnosis not present

## 2018-11-01 DIAGNOSIS — E559 Vitamin D deficiency, unspecified: Secondary | ICD-10-CM | POA: Diagnosis not present

## 2018-11-01 DIAGNOSIS — M9901 Segmental and somatic dysfunction of cervical region: Secondary | ICD-10-CM | POA: Diagnosis not present

## 2018-11-01 DIAGNOSIS — M50322 Other cervical disc degeneration at C5-C6 level: Secondary | ICD-10-CM | POA: Diagnosis not present

## 2018-11-01 DIAGNOSIS — I10 Essential (primary) hypertension: Secondary | ICD-10-CM | POA: Diagnosis not present

## 2018-11-01 DIAGNOSIS — E7849 Other hyperlipidemia: Secondary | ICD-10-CM | POA: Diagnosis not present

## 2018-11-08 DIAGNOSIS — E668 Other obesity: Secondary | ICD-10-CM | POA: Diagnosis not present

## 2018-11-08 DIAGNOSIS — R609 Edema, unspecified: Secondary | ICD-10-CM | POA: Diagnosis not present

## 2018-11-08 DIAGNOSIS — M50322 Other cervical disc degeneration at C5-C6 level: Secondary | ICD-10-CM | POA: Diagnosis not present

## 2018-11-08 DIAGNOSIS — F419 Anxiety disorder, unspecified: Secondary | ICD-10-CM | POA: Diagnosis not present

## 2018-11-08 DIAGNOSIS — I2510 Atherosclerotic heart disease of native coronary artery without angina pectoris: Secondary | ICD-10-CM | POA: Diagnosis not present

## 2018-11-08 DIAGNOSIS — E559 Vitamin D deficiency, unspecified: Secondary | ICD-10-CM | POA: Diagnosis not present

## 2018-11-08 DIAGNOSIS — G7109 Other specified muscular dystrophies: Secondary | ICD-10-CM | POA: Diagnosis not present

## 2018-11-08 DIAGNOSIS — M9901 Segmental and somatic dysfunction of cervical region: Secondary | ICD-10-CM | POA: Diagnosis not present

## 2018-11-08 DIAGNOSIS — N200 Calculus of kidney: Secondary | ICD-10-CM | POA: Diagnosis not present

## 2018-11-08 DIAGNOSIS — R7301 Impaired fasting glucose: Secondary | ICD-10-CM | POA: Diagnosis not present

## 2018-11-08 DIAGNOSIS — K589 Irritable bowel syndrome without diarrhea: Secondary | ICD-10-CM | POA: Diagnosis not present

## 2018-11-08 DIAGNOSIS — K760 Fatty (change of) liver, not elsewhere classified: Secondary | ICD-10-CM | POA: Diagnosis not present

## 2018-11-08 DIAGNOSIS — I5189 Other ill-defined heart diseases: Secondary | ICD-10-CM | POA: Diagnosis not present

## 2018-11-08 DIAGNOSIS — Z0000 Encounter for general adult medical examination without abnormal findings: Secondary | ICD-10-CM | POA: Diagnosis not present

## 2018-11-17 DIAGNOSIS — H35372 Puckering of macula, left eye: Secondary | ICD-10-CM | POA: Diagnosis not present

## 2018-11-17 DIAGNOSIS — Z961 Presence of intraocular lens: Secondary | ICD-10-CM | POA: Diagnosis not present

## 2018-11-17 DIAGNOSIS — H353121 Nonexudative age-related macular degeneration, left eye, early dry stage: Secondary | ICD-10-CM | POA: Diagnosis not present

## 2018-11-17 DIAGNOSIS — H353112 Nonexudative age-related macular degeneration, right eye, intermediate dry stage: Secondary | ICD-10-CM | POA: Diagnosis not present

## 2018-11-17 DIAGNOSIS — H35453 Secondary pigmentary degeneration, bilateral: Secondary | ICD-10-CM | POA: Diagnosis not present

## 2018-11-17 DIAGNOSIS — H35363 Drusen (degenerative) of macula, bilateral: Secondary | ICD-10-CM | POA: Diagnosis not present

## 2018-11-27 ENCOUNTER — Other Ambulatory Visit: Payer: Self-pay

## 2018-11-27 NOTE — Patient Outreach (Signed)
[name]) Care Management  11/27/2018  Catherine Harding 02/09/1930 [id number]  Nurse Call Line Referral Date: 11/24/18 Reason for Referral: diarrhea, request advise about diet Nurse call line recommendation: see primary MD within 2 weeks   Telephone call to patient regarding nurse call line referral. HIPAA verified with patient. Explained reason for call. Patient states she has diarrhea is ongoing. She states she has dealt with this all of her life. She states she sees a gastroenterologist for regular visits as well as an endocrinologist for prediabetes Patient states she would like to know how to manage her diet regarding these conditions.  RNCM advised patient to contact her HTA concierge to determine coverage for a nutritionist/ dietician visits.  RNCM advised patient to contact her primary MD or specialist to request referral to nutritionist/ dietician. Patient verbalized understanding. Patient denies any further needs or concerns. Patient expressed appreciation for follow up call.   PLAN: RNCM will close patient due to patient being assessed and having no further needs.   Quinn Plowman RN,BSN,CCM [facility] Telephonic  [phone]

## 2018-12-22 DIAGNOSIS — H353112 Nonexudative age-related macular degeneration, right eye, intermediate dry stage: Secondary | ICD-10-CM | POA: Diagnosis not present

## 2018-12-22 DIAGNOSIS — H35453 Secondary pigmentary degeneration, bilateral: Secondary | ICD-10-CM | POA: Diagnosis not present

## 2018-12-22 DIAGNOSIS — H35372 Puckering of macula, left eye: Secondary | ICD-10-CM | POA: Diagnosis not present

## 2018-12-22 DIAGNOSIS — H35363 Drusen (degenerative) of macula, bilateral: Secondary | ICD-10-CM | POA: Diagnosis not present

## 2018-12-22 DIAGNOSIS — H353121 Nonexudative age-related macular degeneration, left eye, early dry stage: Secondary | ICD-10-CM | POA: Diagnosis not present

## 2018-12-22 DIAGNOSIS — Z961 Presence of intraocular lens: Secondary | ICD-10-CM | POA: Diagnosis not present

## 2019-01-26 DIAGNOSIS — L570 Actinic keratosis: Secondary | ICD-10-CM | POA: Diagnosis not present

## 2019-01-26 DIAGNOSIS — I10 Essential (primary) hypertension: Secondary | ICD-10-CM | POA: Diagnosis not present

## 2019-01-26 DIAGNOSIS — I872 Venous insufficiency (chronic) (peripheral): Secondary | ICD-10-CM | POA: Diagnosis not present

## 2019-01-26 DIAGNOSIS — R609 Edema, unspecified: Secondary | ICD-10-CM | POA: Diagnosis not present

## 2019-01-30 DIAGNOSIS — M9901 Segmental and somatic dysfunction of cervical region: Secondary | ICD-10-CM | POA: Diagnosis not present

## 2019-01-30 DIAGNOSIS — M5033 Other cervical disc degeneration, cervicothoracic region: Secondary | ICD-10-CM | POA: Diagnosis not present

## 2019-02-05 ENCOUNTER — Ambulatory Visit (HOSPITAL_COMMUNITY): Payer: PPO

## 2019-02-05 ENCOUNTER — Encounter (HOSPITAL_COMMUNITY): Payer: PPO

## 2019-02-06 DIAGNOSIS — M9901 Segmental and somatic dysfunction of cervical region: Secondary | ICD-10-CM | POA: Diagnosis not present

## 2019-02-06 DIAGNOSIS — M5033 Other cervical disc degeneration, cervicothoracic region: Secondary | ICD-10-CM | POA: Diagnosis not present

## 2019-02-13 ENCOUNTER — Other Ambulatory Visit: Payer: Self-pay

## 2019-02-13 ENCOUNTER — Ambulatory Visit (HOSPITAL_COMMUNITY)
Admission: RE | Admit: 2019-02-13 | Discharge: 2019-02-13 | Disposition: A | Discharge status: Home / Self Care | Payer: PPO | Source: Ambulatory Visit | Attending: Endocrinology | Admitting: Endocrinology

## 2019-02-13 DIAGNOSIS — M810 Age-related osteoporosis without current pathological fracture: Secondary | ICD-10-CM | POA: Diagnosis not present

## 2019-02-13 MED ORDER — DENOSUMAB 60 MG/ML ~~LOC~~ SOSY
60.0000 mg | PREFILLED_SYRINGE | Freq: Once | SUBCUTANEOUS | Status: AC
  Administered 2019-02-13: 60 mg via SUBCUTANEOUS
  Filled 2019-02-13: qty 1

## 2019-03-12 DIAGNOSIS — M170 Bilateral primary osteoarthritis of knee: Secondary | ICD-10-CM | POA: Diagnosis not present

## 2019-04-09 DIAGNOSIS — K580 Irritable bowel syndrome with diarrhea: Secondary | ICD-10-CM | POA: Diagnosis not present

## 2019-04-09 DIAGNOSIS — R143 Flatulence: Secondary | ICD-10-CM | POA: Diagnosis not present

## 2019-05-21 DIAGNOSIS — K589 Irritable bowel syndrome without diarrhea: Secondary | ICD-10-CM | POA: Diagnosis not present

## 2019-05-21 DIAGNOSIS — R7301 Impaired fasting glucose: Secondary | ICD-10-CM | POA: Diagnosis not present

## 2019-05-21 DIAGNOSIS — M810 Age-related osteoporosis without current pathological fracture: Secondary | ICD-10-CM | POA: Diagnosis not present

## 2019-05-21 DIAGNOSIS — F419 Anxiety disorder, unspecified: Secondary | ICD-10-CM | POA: Diagnosis not present

## 2019-05-21 DIAGNOSIS — G7109 Other specified muscular dystrophies: Secondary | ICD-10-CM | POA: Diagnosis not present

## 2019-05-21 DIAGNOSIS — E785 Hyperlipidemia, unspecified: Secondary | ICD-10-CM | POA: Diagnosis not present

## 2019-05-21 DIAGNOSIS — I5189 Other ill-defined heart diseases: Secondary | ICD-10-CM | POA: Diagnosis not present

## 2019-05-21 DIAGNOSIS — E669 Obesity, unspecified: Secondary | ICD-10-CM | POA: Diagnosis not present

## 2019-05-21 DIAGNOSIS — I10 Essential (primary) hypertension: Secondary | ICD-10-CM | POA: Diagnosis not present

## 2019-05-21 DIAGNOSIS — N200 Calculus of kidney: Secondary | ICD-10-CM | POA: Diagnosis not present

## 2019-05-21 DIAGNOSIS — K760 Fatty (change of) liver, not elsewhere classified: Secondary | ICD-10-CM | POA: Diagnosis not present

## 2019-05-21 DIAGNOSIS — I872 Venous insufficiency (chronic) (peripheral): Secondary | ICD-10-CM | POA: Diagnosis not present

## 2019-06-20 ENCOUNTER — Other Ambulatory Visit: Payer: Self-pay

## 2019-06-20 ENCOUNTER — Encounter: Payer: Self-pay | Admitting: Neurology

## 2019-06-20 ENCOUNTER — Ambulatory Visit (INDEPENDENT_AMBULATORY_CARE_PROVIDER_SITE_OTHER): Payer: PPO | Admitting: Neurology

## 2019-06-20 VITALS — BP 144/78 | HR 89 | Temp 97.8°F | Ht 62.0 in | Wt 159.0 lb

## 2019-06-20 DIAGNOSIS — G71039 Limb girdle muscular dystrophy, unspecified: Secondary | ICD-10-CM

## 2019-06-20 DIAGNOSIS — M4120 Other idiopathic scoliosis, site unspecified: Secondary | ICD-10-CM | POA: Diagnosis not present

## 2019-06-20 DIAGNOSIS — G7109 Other specified muscular dystrophies: Secondary | ICD-10-CM

## 2019-06-20 NOTE — Progress Notes (Signed)
[facility]  [name] DOB: 1930-10-11   REVISIT- REASON FOR VISIT: Followup for gait abnormality. I have the pleasure of seeing Catherine Harding today meanwhile 85 year old Caucasian right-handed lady that is still able to ambulate but not without assistance.  She is now using a walker or similar frame.  She has been followed in this office for the last 14 years, and has an adult onset limb-girdle muscular dystrophy.  She is using the walker even inside her home.  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  In the past visits we often spoke also about abdominal and flank pain but a CT of the abdomen did not confirm diverticulosis. IBS- diarrhea was diagnosed, has been on diuretics also- she has ankle edema. No varicosis.  Her feet are swollen, not weeping.  She can't put compression stocking on without assistance. As soon as she goes to bed she starts Burping. She already excluded onions, peppers and leeks from her diet.  Dr Forde Dandy ordered echo and vein examination to no results. She suffers from hypokalemia, too.  Nobody has given her an explanation why the legs keep swelling, painfully.     Interval history from 06/01/2017, I have the pleasure of seeing Catherine Harding N/A scheduled yearly revisit, her last 2 appointments were with NP Cecille Rubin. The patient states that her gait has not further deteriorated, she is using a seated walker, she does have abdominal and flank pain and was thought to suffer from diverticulitis, but his CT of the abdomen has not confirmed the presence of inflammatory changes. She now contracted a urinary tract infection, she has had a difficult summer due to abnormal digestion and infection. She is seeing dr Paulita Fujita GI @ [facility] today.  The patient had developed hip pain in the year 2014 and had multiple falls that year, but steroid injections into the hip has helped greatly and she had no recurrence since.  Her last fall was last November.  She now uses the walker regularly outside of the home, in home she " wobbles ' from wall to furniture. She has had dizzyspells. Improved after d/c Hyoscyamines.   Catherine Harding,  a 85 year old female returns for follow-up on her limb girdle dystrophy and associated gait disorder. She was last seen by me on 07/05/2013. 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 The scoliosis has led to some stiffness in her lower extremities problems to arise from a chair or from a seated position. The stiffness is worse if she remained seated for a longer period of time. She reports that just driving to town for 30 or 40 minutes would be enough to have problems. She is however still driving, her problem is to enter and leave the car. In 2007 this established patient underwent a EMG and nerve conduction study, which showed only  a mild peripheral neuropathy. Her neurologic symptoms are otherwise unchanged- she has the expected progression of gait difficulties,  she does have a loss of balance /poor sense of balance and she is walking with a walker to stabilize. She reports using a cane at home. Within the boundaries of her home she can maneuver by holding onto the wall or furniture if needed. She does not use an assistive device per se indoors. She reports sciatic nerve pain. Hip pain- treated with meloxicam and prednisone  But only found relief after sterid injections.    she has had 3  falls in the 2014 , but none in 2015- and all occurred when she was not using her cane outdoors.   HPI _ 2016 ;Patients sister was never diagnosed with Limb Girdle-Muscular Dystrophy, but died wheelchair bound at age 34 in 36. Another sister is still alive at age 74 and in good shape. Catherine Harding is now 9. Neither child nor her grandchild have any signs of muscle atrophy.  The patient has a history of walking " funny " for probably over a decade may be to. But she  was only diagnosed after presenting to the neurology office 9 years ago. There is a family history as her sister was probably affected by the same dystrophy type. None of her children have shown any symptoms at this time and her grandchildren are unaffected also.  Two of her children have  scoliosis , which can be an early symptom. It was her then 36 year old grandson who spotted and named her gait " [employer]negy "  She does report left-sided sciatica and she has a very palpable tender spot at L5 L4. The patella reflex is preserved. She found some relief after an injection. I would like for her to have physical therapy and perhaps massage therapy. Will refer to PT , she had not been seen by neuro rehab.      REVIEW OF SYSTEMS: Full 14 system review of systems performed and notable only for those listed, all others are neg:  Constitutional: Occasional fatigue Leg edema , left over right, not hot but warm, painful to touch, not weeping.   Abdominal cramping. IBS diarrhea.  Musculoskeletal: Muscle cramps, walking difficulty, scoliosis, sciatica .  Allergy/Immunology: N/A  Neurological: Weakness of gait, limb girdle distribution,"  penguin gait " now dependent on  Handle bars - and walker.  Torn rotator cuff-    ALLERGIES: Allergies  Allergen Reactions  . Anesthetics, Amide   . Celebrex [Celecoxib]   . Ciprofloxacin Swelling  . Codeine Nausea And Vomiting  . Morphine And Related Nausea And Vomiting  . Sulfa Antibiotics     HOME MEDICATIONS: Outpatient Medications Prior to Visit  Medication Sig Dispense Refill  . Artificial Tear Solution (SYSTANE CONTACTS) SOLN Apply to eye.    . bacitracin ophthalmic ointment Place [id number] application into both eyes 3 (three) times daily.    . Calcium Carbonate Antacid (TUMS PO) Take by mouth daily as needed. chewable    . Calcium Carbonate-Vit D-Min (CALTRATE PLUS PO) Take by mouth.      . denosumab (PROLIA) 60 MG/ML SOLN injection Inject 60 mg into the skin  every 6 (six) months. Administer in upper arm, thigh, or abdomen    . diltiazem (CARDIZEM CD) 240 MG 24 hr capsule Take 240 mg by mouth daily.      . ergocalciferol (VITAMIN D2) 50000 UNITS capsule Take 50,000 Units by mouth 2 (two) times a week.     . famotidine (PEPCID) 20 MG tablet Take 20 mg by mouth 2 (two) times daily.    Marland Kitchen FLUTICASONE PROPIONATE, NASAL, NA Place into the nose. 2 sprays 1/daily    . furosemide (LASIX) 20 MG tablet Take 20 mg by mouth 2 (two) times daily.     . hyoscyamine (LEVSIN SL) 0.125 MG SL tablet     . lansoprazole (PREVACID) 30 MG capsule Take 30 mg by mouth 2 (two) times daily before a meal.     . loperamide (IMODIUM) 2 MG capsule Take 2 mg by mouth as needed for diarrhea  or loose stools.    Marland Kitchen losartan-hydrochlorothiazide (HYZAAR) 100-12.5 MG per tablet Take 12.5 tablets by mouth daily.    . Multiple Vitamins-Minerals (PRESERVISION/LUTEIN) CAPS Take 2 capsules by mouth daily.    . Omega-3 Fatty Acids (FISH OIL) 1200 MG CAPS Take 2 capsules by mouth daily.    Vladimir Faster Glycol-Propyl Glycol 0.4-0.3 % SOLN Apply to eye.      . Probiotic Product (ALIGN PO) Take by mouth.      . sodium chloride (AYR) 0.65 % nasal spray Place 1 spray into the nose as needed for congestion.     No facility-administered medications prior to visit.     PAST MEDICAL HISTORY: Past Medical History:  Diagnosis Date  . Arthritis   . Atrophic vaginitis   . Basal cell carcinoma   . Cervical dysplasia   . Complication of anesthesia    pseudocholinesterase deficiency-hard to wake up  . Falls   . GERD (gastroesophageal reflux disease)   . Hypertension   . IBS (irritable bowel syndrome)   . Jaw atrophy   . 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 Macular hole of left eye   . Muscular dystrophy ([place])   . Osteoporosis    pelvic fracture  . Pseudocholinesterase deficiency   . Scoliosis     PAST SURGICAL HISTORY: Past Surgical History:  Procedure Laterality Date  .  ABDOMINAL HYSTERECTOMY     TAH BSO  . BREAST LUMPECTOMY  1971   benign  . CESAREAN SECTION     x2  . COLPOSCOPY    . EYE SURGERY  2003   left  . TOTAL ABDOMINAL HYSTERECTOMY W/ BILATERAL SALPINGOOPHORECTOMY  1981  . TRIGGER FINGER RELEASE  07/28/2012   Procedure: RELEASE TRIGGER FINGER/A-1 PULLEY;  Surgeon: Cammie Sickle., MD;  Location: [facility];  Service: Orthopedics;  Laterality: Right;  EXCISION CYST RIGHT LONG A-1 RELEASE A-1 RIGHT LONG     FAMILY HISTORY: Family History  Problem Relation Age of Onset  . Hypertension Sister     SOCIAL HISTORY: Social History   Socioeconomic History  . Marital status: Divorced    Spouse name: Not on file  . Number of children: 3  . Years of education: 44  . Highest education level: Not on file  Occupational History  . Occupation: retired    Comment: Theatre stage manager at [employer]  . Financial resource strain: Not on file  . Food insecurity    Worry: Not on file    Inability: Not on file  . Transportation needs    Medical: Not on file    Non-medical: Not on file  Tobacco Use  . Smoking status: Never Smoker  . Smokeless tobacco: Never Used  Substance and Sexual Activity  . Alcohol use: No    Alcohol/week: 0.0 standard drinks    Comment: rare  . Drug use: No  . Sexual activity: Never    Birth control/protection: Surgical  Lifestyle  . Physical activity    Days per week: Not on file    Minutes per session: Not on file  . Stress: Not on file  Relationships  . Social Herbalist on phone: Not on file    Gets together: Not on file    Attends religious service: Not on file    Active member of club or organization: Not on file    Attends meetings of clubs or organizations: Not on file    Relationship status: Not  on file  . Intimate partner violence    Fear of current or ex partner: Not on file    Emotionally abused: Not on file    Physically abused: Not on file    Forced sexual  activity: Not on file  Other Topics Concern  . Not on file  Social History Narrative   Patient lives at home alone and she is divorced. Patient is retired.   Right handed.   Caffeine- one cup daily.   Uses walker.   Grown children, live 7 miles away.       PHYSICAL EXAM  Vitals:   06/20/19 1017  BP: (!) 144/78  Pulse: 89  Temp: 97.8 F (36.6 C)  Weight: 159 lb (72.1 kg)  Height: 5\' 2"  (1.575 m)   Body mass index is 29.08 kg/m. General: The patient is awake, alert and appears in  distress. The patient is well groomed.  Head: Normocephalic, atraumatic.  Neck is supple.  16 inch circumference.  Cardiovascular: Regular rate and rhythm, without murmurs or carotid bruit, and without distended neck veins.  Respiratory: Lungs are clear to auscultation.  Skin: Without evidence of edema, or rash  Trunk:  Severe scolisis, spinal stenosis.  Neurologic exam :  The patient is awake and alert, oriented to place and time. Memory subjective described as intact.  There is a normal attention span & concentration ability.  Speech is fluent without dysarthria, dysphonia or aphasia.  Mood and affect are appropriate.  Cranial nerves:  No change in taste and smell. Pupils are equal and briskly reactive to light.  Visual fields by finger perimetry are intact.  Hearing to finger rub intact.  Facial sensation intact to fine touch. Facial motor strength is symmetric and tongue and uvula move midline.  Motor exam: Very weak hip flexion and adduction,  Sensory: deep pain in thigh and calves. No knee pain.  Fine touch, pinprick and vibration were severely affected in the feet. Numbness-  Coordination: Rapid alternating movements in the fingers/hands is normal. Finger-to-nose maneuver tested and normal without evidence of ataxia, dysmetria or tremor.  Gait and station: Patient walks with a walker - penguin gait, as named by her 60 year old grandson in 2007. Deep tendon reflexes: in the upper and lower  extremities are Brisk , symmetric and intact.  No clonus.    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  Diarrhea and electrolyte loss, acid reflux, chronic burping. Gas.  Sees GI>  Dr South/ Dr Paulita Fujita.  She is socially isolated - due to fear of stool incontinence accidents.    ASSESSMENT AND PLAN   85 y.o. year old female has probably irritable bowel syndrome,  but not sigmoid inflammation , has further work up with Dr.  Paulita Fujita .  I can not relate LGMD to the leg edema, but belive that electrolyte loss form diuretics and from GI condition could make it worse.  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 Other sister had worse LGMD, deceased. Has seen Dr Erling Cruz .     F/U in 1 year With NP    [facility] [address] [place],  [address] [phone]

## 2019-06-22 DIAGNOSIS — M25511 Pain in right shoulder: Secondary | ICD-10-CM | POA: Diagnosis not present

## 2019-06-25 DIAGNOSIS — H353112 Nonexudative age-related macular degeneration, right eye, intermediate dry stage: Secondary | ICD-10-CM | POA: Diagnosis not present

## 2019-06-25 DIAGNOSIS — H35363 Drusen (degenerative) of macula, bilateral: Secondary | ICD-10-CM | POA: Diagnosis not present

## 2019-06-25 DIAGNOSIS — R142 Eructation: Secondary | ICD-10-CM | POA: Diagnosis not present

## 2019-06-25 DIAGNOSIS — H35453 Secondary pigmentary degeneration, bilateral: Secondary | ICD-10-CM | POA: Diagnosis not present

## 2019-06-25 DIAGNOSIS — K589 Irritable bowel syndrome without diarrhea: Secondary | ICD-10-CM | POA: Diagnosis not present

## 2019-06-25 DIAGNOSIS — H35372 Puckering of macula, left eye: Secondary | ICD-10-CM | POA: Diagnosis not present

## 2019-06-25 DIAGNOSIS — Z961 Presence of intraocular lens: Secondary | ICD-10-CM | POA: Diagnosis not present

## 2019-06-25 DIAGNOSIS — R197 Diarrhea, unspecified: Secondary | ICD-10-CM | POA: Diagnosis not present

## 2019-06-25 DIAGNOSIS — K219 Gastro-esophageal reflux disease without esophagitis: Secondary | ICD-10-CM | POA: Diagnosis not present

## 2019-06-25 DIAGNOSIS — H353121 Nonexudative age-related macular degeneration, left eye, early dry stage: Secondary | ICD-10-CM | POA: Diagnosis not present

## 2019-06-27 ENCOUNTER — Other Ambulatory Visit: Payer: Self-pay | Admitting: Physician Assistant

## 2019-06-27 DIAGNOSIS — R142 Eructation: Secondary | ICD-10-CM

## 2019-06-27 DIAGNOSIS — K219 Gastro-esophageal reflux disease without esophagitis: Secondary | ICD-10-CM

## 2019-07-03 DIAGNOSIS — G7100 Muscular dystrophy, unspecified: Secondary | ICD-10-CM | POA: Diagnosis not present

## 2019-07-03 DIAGNOSIS — M8589 Other specified disorders of bone density and structure, multiple sites: Secondary | ICD-10-CM | POA: Diagnosis not present

## 2019-07-03 DIAGNOSIS — R29890 Loss of height: Secondary | ICD-10-CM | POA: Diagnosis not present

## 2019-07-03 DIAGNOSIS — Z1231 Encounter for screening mammogram for malignant neoplasm of breast: Secondary | ICD-10-CM | POA: Diagnosis not present

## 2019-07-03 DIAGNOSIS — K589 Irritable bowel syndrome without diarrhea: Secondary | ICD-10-CM | POA: Diagnosis not present

## 2019-07-04 ENCOUNTER — Ambulatory Visit
Admission: RE | Admit: 2019-07-04 | Discharge: 2019-07-04 | Disposition: A | Discharge status: Home / Self Care | Payer: PPO | Source: Ambulatory Visit | Attending: Physician Assistant | Admitting: Physician Assistant

## 2019-07-04 ENCOUNTER — Other Ambulatory Visit: Payer: Self-pay | Admitting: Physician Assistant

## 2019-07-04 DIAGNOSIS — K219 Gastro-esophageal reflux disease without esophagitis: Secondary | ICD-10-CM | POA: Diagnosis not present

## 2019-07-04 DIAGNOSIS — R142 Eructation: Secondary | ICD-10-CM

## 2019-07-04 DIAGNOSIS — K224 Dyskinesia of esophagus: Secondary | ICD-10-CM | POA: Diagnosis not present

## 2019-07-12 DIAGNOSIS — R142 Eructation: Secondary | ICD-10-CM | POA: Diagnosis not present

## 2019-07-23 ENCOUNTER — Telehealth (HOSPITAL_COMMUNITY): Payer: Self-pay | Admitting: *Deleted

## 2019-07-23 DIAGNOSIS — E876 Hypokalemia: Secondary | ICD-10-CM | POA: Diagnosis not present

## 2019-07-23 DIAGNOSIS — I872 Venous insufficiency (chronic) (peripheral): Secondary | ICD-10-CM | POA: Diagnosis not present

## 2019-07-23 DIAGNOSIS — I10 Essential (primary) hypertension: Secondary | ICD-10-CM | POA: Diagnosis not present

## 2019-07-23 DIAGNOSIS — I87323 Chronic venous hypertension (idiopathic) with inflammation of bilateral lower extremity: Secondary | ICD-10-CM | POA: Diagnosis not present

## 2019-07-23 DIAGNOSIS — R600 Localized edema: Secondary | ICD-10-CM | POA: Diagnosis not present

## 2019-07-23 DIAGNOSIS — I5189 Other ill-defined heart diseases: Secondary | ICD-10-CM | POA: Diagnosis not present

## 2019-07-23 DIAGNOSIS — R7309 Other abnormal glucose: Secondary | ICD-10-CM | POA: Diagnosis not present

## 2019-07-23 DIAGNOSIS — K219 Gastro-esophageal reflux disease without esophagitis: Secondary | ICD-10-CM | POA: Diagnosis not present

## 2019-07-23 NOTE — Telephone Encounter (Signed)

## 2019-07-24 ENCOUNTER — Other Ambulatory Visit (HOSPITAL_COMMUNITY): Payer: Self-pay | Admitting: Internal Medicine

## 2019-07-24 ENCOUNTER — Ambulatory Visit (HOSPITAL_COMMUNITY)
Admission: RE | Admit: 2019-07-24 | Discharge: 2019-07-24 | Disposition: A | Discharge status: Home / Self Care | Payer: PPO | Source: Ambulatory Visit | Attending: Family | Admitting: Family

## 2019-07-24 ENCOUNTER — Other Ambulatory Visit: Payer: Self-pay

## 2019-07-24 DIAGNOSIS — I872 Venous insufficiency (chronic) (peripheral): Secondary | ICD-10-CM

## 2019-07-30 DIAGNOSIS — I87323 Chronic venous hypertension (idiopathic) with inflammation of bilateral lower extremity: Secondary | ICD-10-CM | POA: Diagnosis not present

## 2019-07-30 DIAGNOSIS — Z23 Encounter for immunization: Secondary | ICD-10-CM | POA: Diagnosis not present

## 2019-07-30 DIAGNOSIS — R600 Localized edema: Secondary | ICD-10-CM | POA: Diagnosis not present

## 2019-07-30 DIAGNOSIS — I5189 Other ill-defined heart diseases: Secondary | ICD-10-CM | POA: Diagnosis not present

## 2019-07-30 DIAGNOSIS — E876 Hypokalemia: Secondary | ICD-10-CM | POA: Diagnosis not present

## 2019-08-06 DIAGNOSIS — R1312 Dysphagia, oropharyngeal phase: Secondary | ICD-10-CM | POA: Diagnosis not present

## 2019-08-06 DIAGNOSIS — R142 Eructation: Secondary | ICD-10-CM | POA: Diagnosis not present

## 2019-08-06 DIAGNOSIS — K589 Irritable bowel syndrome without diarrhea: Secondary | ICD-10-CM | POA: Diagnosis not present

## 2019-08-06 DIAGNOSIS — K219 Gastro-esophageal reflux disease without esophagitis: Secondary | ICD-10-CM | POA: Diagnosis not present

## 2019-08-06 DIAGNOSIS — K224 Dyskinesia of esophagus: Secondary | ICD-10-CM | POA: Diagnosis not present

## 2019-08-07 ENCOUNTER — Other Ambulatory Visit (HOSPITAL_COMMUNITY): Payer: Self-pay | Admitting: *Deleted

## 2019-08-07 DIAGNOSIS — R1310 Dysphagia, unspecified: Secondary | ICD-10-CM

## 2019-08-08 DIAGNOSIS — L814 Other melanin hyperpigmentation: Secondary | ICD-10-CM | POA: Diagnosis not present

## 2019-08-08 DIAGNOSIS — D225 Melanocytic nevi of trunk: Secondary | ICD-10-CM | POA: Diagnosis not present

## 2019-08-08 DIAGNOSIS — L821 Other seborrheic keratosis: Secondary | ICD-10-CM | POA: Diagnosis not present

## 2019-08-08 DIAGNOSIS — D2262 Melanocytic nevi of left upper limb, including shoulder: Secondary | ICD-10-CM | POA: Diagnosis not present

## 2019-08-08 DIAGNOSIS — I8312 Varicose veins of left lower extremity with inflammation: Secondary | ICD-10-CM | POA: Diagnosis not present

## 2019-08-08 DIAGNOSIS — D485 Neoplasm of uncertain behavior of skin: Secondary | ICD-10-CM | POA: Diagnosis not present

## 2019-08-08 DIAGNOSIS — D2371 Other benign neoplasm of skin of right lower limb, including hip: Secondary | ICD-10-CM | POA: Diagnosis not present

## 2019-08-08 DIAGNOSIS — L570 Actinic keratosis: Secondary | ICD-10-CM | POA: Diagnosis not present

## 2019-08-08 DIAGNOSIS — I8311 Varicose veins of right lower extremity with inflammation: Secondary | ICD-10-CM | POA: Diagnosis not present

## 2019-08-08 DIAGNOSIS — Z85828 Personal history of other malignant neoplasm of skin: Secondary | ICD-10-CM | POA: Diagnosis not present

## 2019-08-08 DIAGNOSIS — D2261 Melanocytic nevi of right upper limb, including shoulder: Secondary | ICD-10-CM | POA: Diagnosis not present

## 2019-08-08 DIAGNOSIS — I872 Venous insufficiency (chronic) (peripheral): Secondary | ICD-10-CM | POA: Diagnosis not present

## 2019-08-17 ENCOUNTER — Other Ambulatory Visit: Payer: Self-pay

## 2019-08-17 ENCOUNTER — Ambulatory Visit (HOSPITAL_COMMUNITY)
Admission: RE | Admit: 2019-08-17 | Discharge: 2019-08-17 | Disposition: A | Discharge status: Home / Self Care | Payer: PPO | Source: Ambulatory Visit | Attending: Physician Assistant | Admitting: Physician Assistant

## 2019-08-17 ENCOUNTER — Encounter (HOSPITAL_COMMUNITY): Payer: Self-pay

## 2019-08-17 ENCOUNTER — Ambulatory Visit (HOSPITAL_COMMUNITY)
Admission: RE | Admit: 2019-08-17 | Discharge: 2019-08-17 | Disposition: A | Discharge status: Home / Self Care | Payer: PPO | Source: Ambulatory Visit | Attending: Endocrinology | Admitting: Endocrinology

## 2019-08-17 DIAGNOSIS — R1310 Dysphagia, unspecified: Secondary | ICD-10-CM | POA: Insufficient documentation

## 2019-08-17 DIAGNOSIS — R05 Cough: Secondary | ICD-10-CM | POA: Diagnosis not present

## 2019-08-17 MED ORDER — DENOSUMAB 60 MG/ML ~~LOC~~ SOSY
60.0000 mg | PREFILLED_SYRINGE | Freq: Once | SUBCUTANEOUS | Status: AC
  Administered 2019-08-17: 60 mg via SUBCUTANEOUS
  Filled 2019-08-17: qty 1

## 2019-08-17 NOTE — Discharge Instructions (Signed)
Hypokalemia Hypokalemia means that the amount of potassium in the blood is lower than normal. Potassium is a chemical (electrolyte) that helps regulate the amount of fluid in the body. It also stimulates muscle tightening (contraction) and helps nerves work properly. Normally, most of the body's potassium is inside cells, and only a very small amount is in the blood. Because the amount in the blood is so small, minor changes to potassium levels in the blood can be life-threatening. What are the causes? This condition may be caused by:  Antibiotic medicine.  Diarrhea or vomiting. Taking too much of a medicine that helps you have a bowel movement (laxative) can cause diarrhea and lead to hypokalemia.  Chronic kidney disease (CKD).  Medicines that help the body get rid of excess fluid (diuretics).  Eating disorders, such as bulimia.  Low magnesium levels in the body.  Sweating a lot. What are the signs or symptoms? Symptoms of this condition include:  Weakness.  Constipation.  Fatigue.  Muscle cramps.  Mental confusion.  Skipped heartbeats or irregular heartbeat (palpitations).  Tingling or numbness. How is this diagnosed? This condition is diagnosed with a blood test. How is this treated? This condition may be treated by:  Taking potassium supplements by mouth.  Adjusting the medicines that you take.  Eating more foods that contain a lot of potassium. If your potassium level is very low, you may need to get potassium through an IV and be monitored in the hospital. Follow these instructions at home:   Take over-the-counter and prescription medicines only as told by your health care provider. This includes vitamins and supplements.  Eat a healthy diet. A healthy diet includes fresh fruits and vegetables, whole grains, healthy fats, and lean proteins.  If instructed, eat more foods that contain a lot of potassium. This includes: ? Nuts, such as peanuts and  pistachios. ? Seeds, such as sunflower seeds and pumpkin seeds. ? Peas, lentils, and lima beans. ? Whole grain and bran cereals and breads. ? Fresh fruits and vegetables, such as apricots, avocado, bananas, cantaloupe, kiwi, oranges, tomatoes, asparagus, and potatoes. ? Orange juice. ? Tomato juice. ? Red meats. ? Yogurt.  Keep all follow-up visits as told by your health care provider. This is important. Contact a health care provider if you:  Have weakness that gets worse.  Feel your heart pounding or racing.  Vomit.  Have diarrhea.  Have diabetes (diabetes mellitus) and you have trouble keeping your blood sugar (glucose) in your target range. Get help right away if you:  Have chest pain.  Have shortness of breath.  Have vomiting or diarrhea that lasts for more than 2 days.  Faint. Summary  Hypokalemia means that the amount of potassium in the blood is lower than normal.  This condition is diagnosed with a blood test.  Hypokalemia may be treated by taking potassium supplements, adjusting the medicines that you take, or eating more foods that are high in potassium.  If your potassium level is very low, you may need to get potassium through an IV and be monitored in the hospital. This information is not intended to replace advice given to you by your health care provider. Make sure you discuss any questions you have with your health care provider. Document Released: 09/27/2005 Document Revised: 05/10/2018 Document Reviewed: 05/10/2018 Elsevier Patient Education  [place].  Denosumab injection What is this medicine? DENOSUMAB (den oh sue mab) slows bone breakdown. Prolia is used to treat osteoporosis in women after menopause  and in men, and in people who are taking corticosteroids for 6 months or more. Delton See is used to treat a high calcium level due to cancer and to prevent bone fractures and other bone problems caused by multiple myeloma or cancer bone  metastases. Delton See is also used to treat giant cell tumor of the bone. This medicine may be used for other purposes; ask your health care provider or pharmacist if you have questions. COMMON BRAND NAME(S): Prolia, XGEVA What should I tell my health care provider before I take this medicine? They need to know if you have any of these conditions:  dental disease  having surgery or tooth extraction  infection  kidney disease  low levels of calcium or Vitamin D in the blood  malnutrition  on hemodialysis  skin conditions or sensitivity  thyroid or parathyroid disease  an unusual reaction to denosumab, other medicines, foods, dyes, or preservatives  pregnant or trying to get pregnant  breast-feeding How should I use this medicine? This medicine is for injection under the skin. It is given by a health care professional in a hospital or clinic setting. A special MedGuide will be given to you before each treatment. Be sure to read this information carefully each time. For Prolia, talk to your pediatrician regarding the use of this medicine in children. Special care may be needed. For Delton See, talk to your pediatrician regarding the use of this medicine in children. While this drug may be prescribed for children as young as 13 years for selected conditions, precautions do apply. Overdosage: If you think you have taken too much of this medicine contact a poison control center or emergency room at once. NOTE: This medicine is only for you. Do not share this medicine with others. What if I miss a dose? It is important not to miss your dose. Call your doctor or health care professional if you are unable to keep an appointment. What may interact with this medicine? Do not take this medicine with any of the following medications:  other medicines containing denosumab This medicine may also interact with the following medications:  medicines that lower your chance of fighting  infection  steroid medicines like prednisone or cortisone This list may not describe all possible interactions. Give your health care provider a list of all the medicines, herbs, non-prescription drugs, or dietary supplements you use. Also tell them if you smoke, drink alcohol, or use illegal drugs. Some items may interact with your medicine. What should I watch for while using this medicine? Visit your doctor or health care professional for regular checks on your progress. Your doctor or health care professional may order blood tests and other tests to see how you are doing. Call your doctor or health care professional for advice if you get a fever, chills or sore throat, or other symptoms of a cold or flu. Do not treat yourself. This drug may decrease your body's ability to fight infection. Try to avoid being around people who are sick. You should make sure you get enough calcium and vitamin D while you are taking this medicine, unless your doctor tells you not to. Discuss the foods you eat and the vitamins you take with your health care professional. See your dentist regularly. Brush and floss your teeth as directed. Before you have any dental work done, tell your dentist you are receiving this medicine. Do not become pregnant while taking this medicine or for 5 months after stopping it. Talk with your doctor or  health care professional about your birth control options while taking this medicine. Women should inform their doctor if they wish to become pregnant or think they might be pregnant. There is a potential for serious side effects to an unborn child. Talk to your health care professional or pharmacist for more information. What side effects may I notice from receiving this medicine? Side effects that you should report to your doctor or health care professional as soon as possible:  allergic reactions like skin rash, itching or hives, swelling of the face, lips, or tongue  bone  pain  breathing problems  dizziness  jaw pain, especially after dental work  redness, blistering, peeling of the skin  signs and symptoms of infection like fever or chills; cough; sore throat; pain or trouble passing urine  signs of low calcium like fast heartbeat, muscle cramps or muscle pain; pain, tingling, numbness in the hands or feet; seizures  unusual bleeding or bruising  unusually weak or tired Side effects that usually do not require medical attention (report to your doctor or health care professional if they continue or are bothersome):  constipation  diarrhea  headache  joint pain  loss of appetite  muscle pain  runny nose  tiredness  upset stomach This list may not describe all possible side effects. Call your doctor for medical advice about side effects. You may report side effects to FDA at 1-800-FDA-1088. Where should I keep my medicine? This medicine is only given in a clinic, doctor's office, or other health care setting and will not be stored at home. NOTE: This sheet is a summary. It may not cover all possible information. If you have questions about this medicine, talk to your doctor, pharmacist, or health care provider.  2020 Elsevier/Gold Standard (2018-02-03 16:10:44)

## 2019-10-15 DIAGNOSIS — I87323 Chronic venous hypertension (idiopathic) with inflammation of bilateral lower extremity: Secondary | ICD-10-CM | POA: Diagnosis not present

## 2019-10-15 DIAGNOSIS — N1831 Chronic kidney disease, stage 3a: Secondary | ICD-10-CM | POA: Diagnosis not present

## 2019-10-15 DIAGNOSIS — R600 Localized edema: Secondary | ICD-10-CM | POA: Diagnosis not present

## 2019-10-15 DIAGNOSIS — I129 Hypertensive chronic kidney disease with stage 1 through stage 4 chronic kidney disease, or unspecified chronic kidney disease: Secondary | ICD-10-CM | POA: Diagnosis not present

## 2019-10-15 DIAGNOSIS — I872 Venous insufficiency (chronic) (peripheral): Secondary | ICD-10-CM | POA: Diagnosis not present

## 2019-10-15 DIAGNOSIS — I5189 Other ill-defined heart diseases: Secondary | ICD-10-CM | POA: Diagnosis not present

## 2019-10-16 ENCOUNTER — Ambulatory Visit (HOSPITAL_COMMUNITY)
Admission: RE | Admit: 2019-10-16 | Discharge: 2019-10-16 | Disposition: A | Discharge status: Home / Self Care | Payer: PPO | Source: Ambulatory Visit | Attending: Vascular Surgery | Admitting: Vascular Surgery

## 2019-10-16 ENCOUNTER — Other Ambulatory Visit: Payer: Self-pay

## 2019-10-16 ENCOUNTER — Other Ambulatory Visit (HOSPITAL_COMMUNITY): Payer: Self-pay | Admitting: Endocrinology

## 2019-10-16 DIAGNOSIS — R600 Localized edema: Secondary | ICD-10-CM | POA: Diagnosis not present

## 2019-11-01 DIAGNOSIS — M5134 Other intervertebral disc degeneration, thoracic region: Secondary | ICD-10-CM | POA: Diagnosis not present

## 2019-11-01 DIAGNOSIS — M9902 Segmental and somatic dysfunction of thoracic region: Secondary | ICD-10-CM | POA: Diagnosis not present

## 2019-11-01 DIAGNOSIS — M9904 Segmental and somatic dysfunction of sacral region: Secondary | ICD-10-CM | POA: Diagnosis not present

## 2019-11-01 DIAGNOSIS — M5033 Other cervical disc degeneration, cervicothoracic region: Secondary | ICD-10-CM | POA: Diagnosis not present

## 2019-11-07 DIAGNOSIS — M9902 Segmental and somatic dysfunction of thoracic region: Secondary | ICD-10-CM | POA: Diagnosis not present

## 2019-11-07 DIAGNOSIS — M9904 Segmental and somatic dysfunction of sacral region: Secondary | ICD-10-CM | POA: Diagnosis not present

## 2019-11-07 DIAGNOSIS — R7301 Impaired fasting glucose: Secondary | ICD-10-CM | POA: Diagnosis not present

## 2019-11-07 DIAGNOSIS — M5134 Other intervertebral disc degeneration, thoracic region: Secondary | ICD-10-CM | POA: Diagnosis not present

## 2019-11-07 DIAGNOSIS — M5033 Other cervical disc degeneration, cervicothoracic region: Secondary | ICD-10-CM | POA: Diagnosis not present

## 2019-11-07 DIAGNOSIS — M810 Age-related osteoporosis without current pathological fracture: Secondary | ICD-10-CM | POA: Diagnosis not present

## 2019-11-07 DIAGNOSIS — E7849 Other hyperlipidemia: Secondary | ICD-10-CM | POA: Diagnosis not present

## 2019-11-08 DIAGNOSIS — I129 Hypertensive chronic kidney disease with stage 1 through stage 4 chronic kidney disease, or unspecified chronic kidney disease: Secondary | ICD-10-CM | POA: Diagnosis not present

## 2019-11-08 DIAGNOSIS — R82998 Other abnormal findings in urine: Secondary | ICD-10-CM | POA: Diagnosis not present

## 2019-11-14 DIAGNOSIS — M810 Age-related osteoporosis without current pathological fracture: Secondary | ICD-10-CM | POA: Diagnosis not present

## 2019-11-14 DIAGNOSIS — I2510 Atherosclerotic heart disease of native coronary artery without angina pectoris: Secondary | ICD-10-CM | POA: Diagnosis not present

## 2019-11-14 DIAGNOSIS — G7109 Other specified muscular dystrophies: Secondary | ICD-10-CM | POA: Diagnosis not present

## 2019-11-14 DIAGNOSIS — I5189 Other ill-defined heart diseases: Secondary | ICD-10-CM | POA: Diagnosis not present

## 2019-11-14 DIAGNOSIS — R600 Localized edema: Secondary | ICD-10-CM | POA: Diagnosis not present

## 2019-11-14 DIAGNOSIS — Z0000 Encounter for general adult medical examination without abnormal findings: Secondary | ICD-10-CM | POA: Diagnosis not present

## 2019-11-14 DIAGNOSIS — E785 Hyperlipidemia, unspecified: Secondary | ICD-10-CM | POA: Diagnosis not present

## 2019-11-14 DIAGNOSIS — R7301 Impaired fasting glucose: Secondary | ICD-10-CM | POA: Diagnosis not present

## 2019-11-14 DIAGNOSIS — Z1331 Encounter for screening for depression: Secondary | ICD-10-CM | POA: Diagnosis not present

## 2019-11-14 DIAGNOSIS — K760 Fatty (change of) liver, not elsewhere classified: Secondary | ICD-10-CM | POA: Diagnosis not present

## 2019-11-14 DIAGNOSIS — E559 Vitamin D deficiency, unspecified: Secondary | ICD-10-CM | POA: Diagnosis not present

## 2019-11-14 DIAGNOSIS — I129 Hypertensive chronic kidney disease with stage 1 through stage 4 chronic kidney disease, or unspecified chronic kidney disease: Secondary | ICD-10-CM | POA: Diagnosis not present

## 2019-11-14 DIAGNOSIS — N1831 Chronic kidney disease, stage 3a: Secondary | ICD-10-CM | POA: Diagnosis not present

## 2019-12-09 ENCOUNTER — Ambulatory Visit: Payer: PPO | Attending: Internal Medicine

## 2019-12-09 DIAGNOSIS — Z23 Encounter for immunization: Secondary | ICD-10-CM | POA: Insufficient documentation

## 2019-12-09 NOTE — Progress Notes (Signed)
   Covid-19 Vaccination Clinic  Name:  Catherine Harding    MRN: [id number] DOB: [birth year]  12/09/2019  [name] was observed post Covid-19 immunization for 30 minutes based on pre-vaccination screening without incidence. She was provided with Vaccine Information Sheet and instruction to access the V-Safe system.   Ms. Needles was instructed to call 911 with any severe reactions post vaccine: Marland Kitchen Difficulty breathing  . Swelling of your face and throat  . A fast heartbeat  . A bad rash all over your body  . Dizziness and weakness    Immunizations Administered    Name Date Dose VIS Date Route   Pfizer COVID-19 Vaccine 12/09/2019  3:25 PM 0.3 mL 09/21/2019 Intramuscular   Manufacturer: [place]   Lot: [id number]   [place]: [id number]

## 2019-12-18 DIAGNOSIS — H35372 Puckering of macula, left eye: Secondary | ICD-10-CM | POA: Diagnosis not present

## 2019-12-18 DIAGNOSIS — Z961 Presence of intraocular lens: Secondary | ICD-10-CM | POA: Diagnosis not present

## 2019-12-18 DIAGNOSIS — H35453 Secondary pigmentary degeneration, bilateral: Secondary | ICD-10-CM | POA: Diagnosis not present

## 2019-12-18 DIAGNOSIS — H35363 Drusen (degenerative) of macula, bilateral: Secondary | ICD-10-CM | POA: Diagnosis not present

## 2019-12-18 DIAGNOSIS — H353112 Nonexudative age-related macular degeneration, right eye, intermediate dry stage: Secondary | ICD-10-CM | POA: Diagnosis not present

## 2020-01-08 ENCOUNTER — Ambulatory Visit: Payer: PPO | Attending: Internal Medicine

## 2020-01-08 DIAGNOSIS — Z23 Encounter for immunization: Secondary | ICD-10-CM

## 2020-01-08 NOTE — Progress Notes (Signed)
   Covid-19 Vaccination Clinic  Name:  Catherine Harding    MRN: [id number] DOB: 08/07/1930  01/08/2020  [name] was observed post Covid-19 immunization for 15 minutes without incident. She was provided with Vaccine Information Sheet and instruction to access the V-Safe system.   [name] was instructed to call 911 with any severe reactions post vaccine: Marland Kitchen Difficulty breathing  . Swelling of face and throat  . A fast heartbeat  . A bad rash all over body  . Dizziness and weakness   Immunizations Administered    Name Date Dose VIS Date Route   Pfizer COVID-19 Vaccine 01/08/2020  4:46 PM 0.3 mL 09/21/2019 Intramuscular   Manufacturer: [place]   Lot: [id number]   [place]: [id number]

## 2020-01-09 DIAGNOSIS — Z961 Presence of intraocular lens: Secondary | ICD-10-CM | POA: Diagnosis not present

## 2020-01-09 DIAGNOSIS — H35363 Drusen (degenerative) of macula, bilateral: Secondary | ICD-10-CM | POA: Diagnosis not present

## 2020-01-09 DIAGNOSIS — H353221 Exudative age-related macular degeneration, left eye, with active choroidal neovascularization: Secondary | ICD-10-CM | POA: Diagnosis not present

## 2020-01-09 DIAGNOSIS — H35372 Puckering of macula, left eye: Secondary | ICD-10-CM | POA: Diagnosis not present

## 2020-01-09 DIAGNOSIS — H353112 Nonexudative age-related macular degeneration, right eye, intermediate dry stage: Secondary | ICD-10-CM | POA: Diagnosis not present

## 2020-01-29 DIAGNOSIS — H903 Sensorineural hearing loss, bilateral: Secondary | ICD-10-CM | POA: Diagnosis not present

## 2020-02-11 ENCOUNTER — Ambulatory Visit (HOSPITAL_COMMUNITY)
Admission: RE | Admit: 2020-02-11 | Discharge: 2020-02-11 | Disposition: A | Discharge status: Home / Self Care | Payer: PPO | Source: Ambulatory Visit | Attending: Surgery | Admitting: Surgery

## 2020-02-11 ENCOUNTER — Other Ambulatory Visit (HOSPITAL_COMMUNITY): Payer: Self-pay | Admitting: Endocrinology

## 2020-02-11 ENCOUNTER — Other Ambulatory Visit: Payer: Self-pay

## 2020-02-11 DIAGNOSIS — R609 Edema, unspecified: Secondary | ICD-10-CM | POA: Diagnosis not present

## 2020-02-11 DIAGNOSIS — I129 Hypertensive chronic kidney disease with stage 1 through stage 4 chronic kidney disease, or unspecified chronic kidney disease: Secondary | ICD-10-CM | POA: Diagnosis not present

## 2020-02-11 DIAGNOSIS — R600 Localized edema: Secondary | ICD-10-CM | POA: Diagnosis not present

## 2020-02-11 DIAGNOSIS — L03116 Cellulitis of left lower limb: Secondary | ICD-10-CM | POA: Diagnosis not present

## 2020-02-11 DIAGNOSIS — B351 Tinea unguium: Secondary | ICD-10-CM | POA: Diagnosis not present

## 2020-02-11 DIAGNOSIS — N1831 Chronic kidney disease, stage 3a: Secondary | ICD-10-CM | POA: Diagnosis not present

## 2020-02-13 DIAGNOSIS — H353221 Exudative age-related macular degeneration, left eye, with active choroidal neovascularization: Secondary | ICD-10-CM | POA: Diagnosis not present

## 2020-02-15 ENCOUNTER — Ambulatory Visit (HOSPITAL_COMMUNITY): Payer: PPO

## 2020-02-20 ENCOUNTER — Other Ambulatory Visit (HOSPITAL_COMMUNITY): Payer: Self-pay

## 2020-02-27 DIAGNOSIS — H353221 Exudative age-related macular degeneration, left eye, with active choroidal neovascularization: Secondary | ICD-10-CM | POA: Diagnosis not present

## 2020-02-27 DIAGNOSIS — H35363 Drusen (degenerative) of macula, bilateral: Secondary | ICD-10-CM | POA: Diagnosis not present

## 2020-02-27 DIAGNOSIS — H43812 Vitreous degeneration, left eye: Secondary | ICD-10-CM | POA: Diagnosis not present

## 2020-02-27 DIAGNOSIS — H53142 Visual discomfort, left eye: Secondary | ICD-10-CM | POA: Diagnosis not present

## 2020-02-27 DIAGNOSIS — H353111 Nonexudative age-related macular degeneration, right eye, early dry stage: Secondary | ICD-10-CM | POA: Diagnosis not present

## 2020-02-27 DIAGNOSIS — Z961 Presence of intraocular lens: Secondary | ICD-10-CM | POA: Diagnosis not present

## 2020-02-27 DIAGNOSIS — R1011 Right upper quadrant pain: Secondary | ICD-10-CM | POA: Diagnosis not present

## 2020-02-27 DIAGNOSIS — H35453 Secondary pigmentary degeneration, bilateral: Secondary | ICD-10-CM | POA: Diagnosis not present

## 2020-02-27 DIAGNOSIS — H35432 Paving stone degeneration of retina, left eye: Secondary | ICD-10-CM | POA: Diagnosis not present

## 2020-03-03 DIAGNOSIS — K529 Noninfective gastroenteritis and colitis, unspecified: Secondary | ICD-10-CM | POA: Diagnosis not present

## 2020-03-03 DIAGNOSIS — R109 Unspecified abdominal pain: Secondary | ICD-10-CM | POA: Diagnosis not present

## 2020-03-03 DIAGNOSIS — K589 Irritable bowel syndrome without diarrhea: Secondary | ICD-10-CM | POA: Diagnosis not present

## 2020-03-03 DIAGNOSIS — R143 Flatulence: Secondary | ICD-10-CM | POA: Diagnosis not present

## 2020-03-07 ENCOUNTER — Ambulatory Visit (HOSPITAL_COMMUNITY)
Admission: RE | Admit: 2020-03-07 | Discharge: 2020-03-07 | Disposition: A | Discharge status: Home / Self Care | Payer: PPO | Source: Ambulatory Visit | Attending: Endocrinology | Admitting: Endocrinology

## 2020-03-07 ENCOUNTER — Encounter (HOSPITAL_COMMUNITY): Payer: Self-pay

## 2020-03-07 ENCOUNTER — Other Ambulatory Visit: Payer: Self-pay

## 2020-03-07 DIAGNOSIS — M810 Age-related osteoporosis without current pathological fracture: Secondary | ICD-10-CM | POA: Insufficient documentation

## 2020-03-07 MED ORDER — DENOSUMAB 60 MG/ML ~~LOC~~ SOSY: 60.0000 mg | PREFILLED_SYRINGE | Freq: Once | SUBCUTANEOUS | Status: AC

## 2020-03-07 MED ORDER — DENOSUMAB 60 MG/ML ~~LOC~~ SOSY
PREFILLED_SYRINGE | SUBCUTANEOUS | Status: AC
  Administered 2020-03-07: 60 mg via SUBCUTANEOUS
  Filled 2020-03-07: qty 1

## 2020-03-07 NOTE — Discharge Instructions (Signed)
Your next appointment will be at the new [place], located at [facility] [place], [place] [address]   You will enter at Entrance A, you may use valet parking and check in with Admitting as you enter the main entrance doors.  They will direct you from there.  Your next appointment will be due after September 07, 2020.  You will need to call [phone] to get scheduled for your appointment.  At this time, they do not make appointments more than 2 months out.  So give them a call in September of 2021 to get your Prolia injection scheduled.      Denosumab injection What is this medicine? DENOSUMAB (den oh sue mab) slows bone breakdown. Prolia is used to treat osteoporosis in women after menopause and in men, and in people who are taking corticosteroids for 6 months or more. Delton See is used to treat a high calcium level due to cancer and to prevent bone fractures and other bone problems caused by multiple myeloma or cancer bone metastases. Delton See is also used to treat giant cell tumor of the bone. This medicine may be used for other purposes; ask your health care provider or pharmacist if you have questions. COMMON BRAND NAME(S): Prolia, XGEVA What should I tell my health care provider before I take this medicine? They need to know if you have any of these conditions:  dental disease  having surgery or tooth extraction  infection  kidney disease  low levels of calcium or Vitamin D in the blood  malnutrition  on hemodialysis  skin conditions or sensitivity  thyroid or parathyroid disease  an unusual reaction to denosumab, other medicines, foods, dyes, or preservatives  pregnant or trying to get pregnant  breast-feeding How should I use this medicine? This medicine is for injection under the skin. It is given by a health care professional in a hospital or clinic setting. A special MedGuide will be given to you before each treatment. Be sure to  read this information carefully each time. For Prolia, talk to your pediatrician regarding the use of this medicine in children. Special care may be needed. For Delton See, talk to your pediatrician regarding the use of this medicine in children. While this drug may be prescribed for children as young as 13 years for selected conditions, precautions do apply. Overdosage: If you think you have taken too much of this medicine contact a poison control center or emergency room at once. NOTE: This medicine is only for you. Do not share this medicine with others. What if I miss a dose? It is important not to miss your dose. Call your doctor or health care professional if you are unable to keep an appointment. What may interact with this medicine? Do not take this medicine with any of the following medications:  other medicines containing denosumab This medicine may also interact with the following medications:  medicines that lower your chance of fighting infection  steroid medicines like prednisone or cortisone This list may not describe all possible interactions. Give your health care provider a list of all the medicines, herbs, non-prescription drugs, or dietary supplements you use. Also tell them if you smoke, drink alcohol, or use illegal drugs. Some items may interact with your medicine. What should I watch for while using this medicine? Visit your doctor or health care professional for regular checks on your progress. Your doctor or health care professional may order blood tests and other tests to see  how you are doing. Call your doctor or health care professional for advice if you get a fever, chills or sore throat, or other symptoms of a cold or flu. Do not treat yourself. This drug may decrease your body's ability to fight infection. Try to avoid being around people who are sick. You should make sure you get enough calcium and vitamin D while you are taking this medicine, unless your doctor tells  you not to. Discuss the foods you eat and the vitamins you take with your health care professional. See your dentist regularly. Brush and floss your teeth as directed. Before you have any dental work done, tell your dentist you are receiving this medicine. Do not become pregnant while taking this medicine or for 5 months after stopping it. Talk with your doctor or health care professional about your birth control options while taking this medicine. Women should inform their doctor if they wish to become pregnant or think they might be pregnant. There is a potential for serious side effects to an unborn child. Talk to your health care professional or pharmacist for more information. What side effects may I notice from receiving this medicine? Side effects that you should report to your doctor or health care professional as soon as possible:  allergic reactions like skin rash, itching or hives, swelling of the face, lips, or tongue  bone pain  breathing problems  dizziness  jaw pain, especially after dental work  redness, blistering, peeling of the skin  signs and symptoms of infection like fever or chills; cough; sore throat; pain or trouble passing urine  signs of low calcium like fast heartbeat, muscle cramps or muscle pain; pain, tingling, numbness in the hands or feet; seizures  unusual bleeding or bruising  unusually weak or tired Side effects that usually do not require medical attention (report to your doctor or health care professional if they continue or are bothersome):  constipation  diarrhea  headache  joint pain  loss of appetite  muscle pain  runny nose  tiredness  upset stomach This list may not describe all possible side effects. Call your doctor for medical advice about side effects. You may report side effects to FDA at 1-800-FDA-1088. Where should I keep my medicine? This medicine is only given in a clinic, doctor's office, or other health care setting  and will not be stored at home. NOTE: This sheet is a summary. It may not cover all possible information. If you have questions about this medicine, talk to your doctor, pharmacist, or health care provider.  2020 Elsevier/Gold Standard (2018-02-03 16:10:44)

## 2020-03-07 NOTE — Progress Notes (Signed)
60mg  SQ Prolia injection given today.  Prolia info.sheet given to patient.  Patient was also informed that her next appointment would be at the new [place] located at [facility].  Informed her the center is currently not scheduling appointments more than 2 months out and she should call them at [phone] in September 2021 to schedule the next Prolia injection, which will be due again AFTER September 07, 2020.  Gave the patient the address of the hospital and told her where to go. Patient voiced understanding to all information given.

## 2020-03-19 ENCOUNTER — Other Ambulatory Visit: Payer: Self-pay

## 2020-03-19 ENCOUNTER — Ambulatory Visit (INDEPENDENT_AMBULATORY_CARE_PROVIDER_SITE_OTHER): Payer: PPO | Admitting: Podiatry

## 2020-03-19 DIAGNOSIS — B351 Tinea unguium: Secondary | ICD-10-CM

## 2020-03-19 DIAGNOSIS — M79675 Pain in left toe(s): Secondary | ICD-10-CM | POA: Diagnosis not present

## 2020-03-19 DIAGNOSIS — M79674 Pain in right toe(s): Secondary | ICD-10-CM | POA: Diagnosis not present

## 2020-03-20 ENCOUNTER — Encounter: Payer: Self-pay | Admitting: Podiatry

## 2020-03-20 NOTE — Progress Notes (Signed)
  Subjective:  Patient ID: Catherine Harding, female    DOB: [birth year],  MRN: [id number]  Chief Complaint  Patient presents with  . Nail Problem    Pt states very painful fungal nail infection bilateral 1-5 nails, 63yr duration. "I had cellulitis in both legs recently and want to know if my nails are related"   86 y.o. female returns for the above complaint.  Patient presents with thickened elongated dystrophic toenails x10.  They are painful to touch.  Patient has tried sildenafil but has not helped.  Patient feels like the nails are being pulled off.  It has been there for 1 year.  She would like to have them debrided down as she is not able to do it herself.  She denies any other acute complaints.  Objective:  There were no vitals filed for this visit. Podiatric Exam: Vascular: dorsalis pedis and posterior tibial pulses are palpable bilateral. Capillary return is immediate. Temperature gradient is WNL. Skin turgor WNL  Sensorium: Normal Semmes Weinstein monofilament test. Normal tactile sensation bilaterally. Nail Exam: Pt has thick disfigured discolored nails with subungual debris noted bilateral entire nail hallux through fifth toenails.  Pain on palpation to the nails. Ulcer Exam: There is no evidence of ulcer or pre-ulcerative changes or infection. Orthopedic Exam: Muscle tone and strength are WNL. No limitations in general ROM. No crepitus or effusions noted. HAV  B/L.  Hammer toes 2-5  B/L. Skin: No Porokeratosis. No infection or ulcers    Assessment & Plan:  No diagnosis found.  Patient was evaluated and treated and all questions answered.  Onychomycosis with pain  -Nails palliatively debrided as below. -Educated on self-care  Procedure: Nail Debridement Rationale: pain  Type of Debridement: manual, sharp debridement. Instrumentation: Nail nipper, rotary burr. Number of Nails: 10  Procedures and Treatment: Consent by patient was obtained for treatment procedures. The  patient understood the discussion of treatment and procedures well. All questions were answered thoroughly reviewed. Debridement of mycotic and hypertrophic toenails, 1 through 5 bilateral and clearing of subungual debris. No ulceration, no infection noted.  Return Visit-Office Procedure: Patient instructed to return to the office for a follow up visit 3 months for continued evaluation and treatment.  Boneta Lucks, DPM    No follow-ups on file.

## 2020-03-26 DIAGNOSIS — H353221 Exudative age-related macular degeneration, left eye, with active choroidal neovascularization: Secondary | ICD-10-CM | POA: Diagnosis not present

## 2020-04-02 DIAGNOSIS — K580 Irritable bowel syndrome with diarrhea: Secondary | ICD-10-CM | POA: Diagnosis not present

## 2020-05-01 DIAGNOSIS — H353221 Exudative age-related macular degeneration, left eye, with active choroidal neovascularization: Secondary | ICD-10-CM | POA: Diagnosis not present

## 2020-06-04 DIAGNOSIS — H353112 Nonexudative age-related macular degeneration, right eye, intermediate dry stage: Secondary | ICD-10-CM | POA: Diagnosis not present

## 2020-06-04 DIAGNOSIS — H52203 Unspecified astigmatism, bilateral: Secondary | ICD-10-CM | POA: Diagnosis not present

## 2020-06-04 DIAGNOSIS — H0100A Unspecified blepharitis right eye, upper and lower eyelids: Secondary | ICD-10-CM | POA: Diagnosis not present

## 2020-06-04 DIAGNOSIS — H353220 Exudative age-related macular degeneration, left eye, stage unspecified: Secondary | ICD-10-CM | POA: Diagnosis not present

## 2020-06-05 DIAGNOSIS — M9901 Segmental and somatic dysfunction of cervical region: Secondary | ICD-10-CM | POA: Diagnosis not present

## 2020-06-05 DIAGNOSIS — M50322 Other cervical disc degeneration at C5-C6 level: Secondary | ICD-10-CM | POA: Diagnosis not present

## 2020-06-10 DIAGNOSIS — N1831 Chronic kidney disease, stage 3a: Secondary | ICD-10-CM | POA: Diagnosis not present

## 2020-06-10 DIAGNOSIS — E559 Vitamin D deficiency, unspecified: Secondary | ICD-10-CM | POA: Diagnosis not present

## 2020-06-10 DIAGNOSIS — R7301 Impaired fasting glucose: Secondary | ICD-10-CM | POA: Diagnosis not present

## 2020-06-10 DIAGNOSIS — E785 Hyperlipidemia, unspecified: Secondary | ICD-10-CM | POA: Diagnosis not present

## 2020-06-10 DIAGNOSIS — M810 Age-related osteoporosis without current pathological fracture: Secondary | ICD-10-CM | POA: Diagnosis not present

## 2020-06-10 DIAGNOSIS — I2510 Atherosclerotic heart disease of native coronary artery without angina pectoris: Secondary | ICD-10-CM | POA: Diagnosis not present

## 2020-06-10 DIAGNOSIS — K589 Irritable bowel syndrome without diarrhea: Secondary | ICD-10-CM | POA: Diagnosis not present

## 2020-06-10 DIAGNOSIS — F419 Anxiety disorder, unspecified: Secondary | ICD-10-CM | POA: Diagnosis not present

## 2020-06-10 DIAGNOSIS — G7109 Other specified muscular dystrophies: Secondary | ICD-10-CM | POA: Diagnosis not present

## 2020-06-10 DIAGNOSIS — I129 Hypertensive chronic kidney disease with stage 1 through stage 4 chronic kidney disease, or unspecified chronic kidney disease: Secondary | ICD-10-CM | POA: Diagnosis not present

## 2020-06-12 DIAGNOSIS — H353221 Exudative age-related macular degeneration, left eye, with active choroidal neovascularization: Secondary | ICD-10-CM | POA: Diagnosis not present

## 2020-06-23 ENCOUNTER — Other Ambulatory Visit: Payer: Self-pay

## 2020-06-23 ENCOUNTER — Ambulatory Visit (INDEPENDENT_AMBULATORY_CARE_PROVIDER_SITE_OTHER): Payer: PPO | Admitting: Family Medicine

## 2020-06-23 VITALS — BP 148/70 | HR 66 | Ht 60.0 in | Wt 161.0 lb

## 2020-06-23 DIAGNOSIS — M4120 Other idiopathic scoliosis, site unspecified: Secondary | ICD-10-CM

## 2020-06-23 DIAGNOSIS — G71039 Limb girdle muscular dystrophy, unspecified: Secondary | ICD-10-CM

## 2020-06-23 DIAGNOSIS — G7109 Other specified muscular dystrophies: Secondary | ICD-10-CM

## 2020-06-23 NOTE — Progress Notes (Signed)
[name] DOB: [birth year]  REASON FOR VISIT: follow up HISTORY FROM: patient  Chief Complaint  Patient presents with  . Follow-up    rm 2 here for a f/u on muscular dystrophy. Pt said she is now using a walker     HISTORY OF PRESENT ILLNESS: Today 06/25/20 Catherine Harding is a 86 y.o. female here today for follow up. She feels that increased swelling of bilateral legs have decreased her mobility. She continues to live alone. She is able to perform ADL's independently. She can not get up stairs. She continues to use her Rolator. She has great support with her family. She has family that helps with lawn work. She someone helping her with cleaning her home. She is no longer driving long distances due to vision changes. She is followed by ophthalmology regularly. She denies any falls. She feels that she is doing failry well, overall. She is followed closely by PCP.   HISTORY: (copied from Dr Dohmeier's note on 06/20/2019)  I have the pleasure of seeing Karissa Meenan today meanwhile 86 year old Caucasian right-handed lady that is still able to ambulate but not without assistance.  She is now using a walker or similar frame.  She has been followed in this office for the last 14 years, and has an adult onset limb-girdle muscular dystrophy.  She is using the walker even inside her home.  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  In the past visits we often spoke also about abdominal and flank pain but a CT of the abdomen did not confirm diverticulosis. IBS- diarrhea was diagnosed, has been on diuretics also- she has ankle edema. No varicosis.  Her feet are swollen, not weeping.  She can't put compression stocking on without assistance. As soon as she goes to bed she starts Burping. She already excluded onions, peppers and leeks from her diet.  Dr Forde Dandy ordered echo and vein examination to no results. She suffers from hypokalemia, too.  Nobody has  given her an explanation why the legs keep swelling, painfully.     Interval history from 06/01/2017, I have the pleasure of seeing Mrs. Dobis N/A scheduled yearly revisit, her last 2 appointments were with NP Cecille Rubin. The patient states that her gait has not further deteriorated, she is using a seated walker, she does have abdominal and flank pain and was thought to suffer from diverticulitis, but his CT of the abdomen has not confirmed the presence of inflammatory changes. She now contracted a urinary tract infection, she has had a difficult summer due to abnormal digestion and infection. She is seeing dr Paulita Fujita GI @ [facility] today.  The patient had developed hip pain in the year 2014 and had multiple falls that year, but steroid injections into the hip has helped greatly and she had no recurrence since. Her last fall was last November.  She now uses the walker regularly outside of the home, in home she " wobbles ' from wall to furniture. She has had dizzyspells. Improved after d/c Hyoscyamines.   Ms. Gradilla,  a 86 year old female returns for follow-up on her limb girdle dystrophy and associated gait disorder. She was last seen by me on 07/05/2013. 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 The scoliosis has led to some stiffness in her lower extremities problems to arise from a chair or from a seated position. The stiffness is worse if she remained seated  for a longer period of time. She reports that just driving to town for 30 or 40 minutes would be enough to have problems. She is however still driving, her problem is to enter and leave the car. In 2007 this established patient underwent a EMG and nerve conduction study, which showed only  a mild peripheral neuropathy. Her neurologic symptoms are otherwise unchanged- she has the expected progression of gait difficulties,  she does have a loss of balance /poor sense of balance and she is  walking with a walker to stabilize. She reports using a cane at home. Within the boundaries of her home she can maneuver by holding onto the wall or furniture if needed. She does not use an assistive device per se indoors. She reports sciatic nerve pain. Hip pain- treated with meloxicam and prednisone  But only found relief after sterid injections.    she has had 3 falls in the 2014 , but none in 2015- and all occurred when she was not using her cane outdoors.   HPI _ 2016 ;Patients sister was never diagnosed with Limb Girdle-Muscular Dystrophy, but died wheelchair bound at age 38 in 62. Another sister is still alive at age 56 and in good shape. [name] is now 44. Neither child nor her grandchild have any signs of muscle atrophy.  The patient has a history of walking " funny " for probably over a decade may be to. But she was only diagnosed after presenting to the neurology office 9 years ago. There is a family history as her sister was probably affected by the same dystrophy type. None of her children have shown any symptoms at this time and her grandchildren are unaffected also.  Two of her children have  scoliosis , which can be an early symptom. It was her then 53 year old grandson who spotted and named her gait " Dynegy "  She does report left-sided sciatica and she has a very palpable tender spot at L5 L4. The patella reflex is preserved. She found some relief after an injection. I would like for her to have physical therapy and perhaps massage therapy. Will refer to PT , she had not been seen by neuro rehab.     REVIEW OF SYSTEMS: Out of a complete 14 system review of symptoms, the patient complains only of the following symptoms, chronic pain, gait abnormality, imbalance, and all other reviewed systems are negative.   ALLERGIES: Allergies  Allergen Reactions  . Anesthetics, Amide   . Celebrex [Celecoxib]   . Ciprofloxacin Swelling  . Codeine Nausea And Vomiting  . Morphine And  Related Nausea And Vomiting  . Sulfa Antibiotics     HOME MEDICATIONS: Outpatient Medications Prior to Visit  Medication Sig Dispense Refill  . Artificial Tear Solution (SYSTANE CONTACTS) SOLN Apply to eye.    . bacitracin ophthalmic ointment Place 778 application into both eyes 3 (three) times daily.    . Calcium Carbonate Antacid (TUMS PO) Take by mouth daily as needed. chewable    . Calcium Carbonate-Vit D-Min (CALTRATE PLUS PO) Take by mouth.      . denosumab (PROLIA) 60 MG/ML SOLN injection Inject 60 mg into the skin every 6 (six) months. Administer in upper arm, thigh, or abdomen    . diltiazem (CARDIZEM CD) 240 MG 24 hr capsule Take 240 mg by mouth daily.      Marland Kitchen doxycycline (VIBRA-TABS) 100 MG tablet Take 100 mg by mouth 2 (two) times daily.    Marland Kitchen  ergocalciferol (VITAMIN D2) 50000 UNITS capsule Take 50,000 Units by mouth 2 (two) times a week.     . famotidine (PEPCID) 20 MG tablet Take 20 mg by mouth 2 (two) times daily.    . fluticasone (FLONASE) 50 MCG/ACT nasal spray     . FLUTICASONE PROPIONATE, NASAL, NA Place into the nose. 2 sprays 1/daily    . furosemide (LASIX) 20 MG tablet Take 20 mg by mouth 2 (two) times daily. Patient states she takes 2 tablets in the morning and 1 tablet at 2pm each day.    . hyoscyamine (LEVSIN SL) 0.125 MG SL tablet     . lansoprazole (PREVACID) 30 MG capsule Take 30 mg by mouth 2 (two) times daily before a meal.     . loperamide (IMODIUM) 2 MG capsule Take 2 mg by mouth as needed for diarrhea or loose stools.    Marland Kitchen losartan-hydrochlorothiazide (HYZAAR) 100-12.5 MG per tablet Take 12.5 tablets by mouth daily.    . Multiple Vitamins-Minerals (PRESERVISION/LUTEIN) CAPS Take 2 capsules by mouth daily.    . Omega-3 Fatty Acids (FISH OIL) 1200 MG CAPS Take 2 capsules by mouth daily.    Vladimir Faster Glycol-Propyl Glycol 0.4-0.3 % SOLN Apply to eye.      . potassium chloride (KLOR-CON) 10 MEQ tablet Take 10 mEq by mouth daily.    . Probiotic Product (ALIGN PO)  Take by mouth.      . sodium chloride (AYR) 0.65 % nasal spray Place 1 spray into the nose as needed for congestion.    . triamcinolone cream (KENALOG) 0.1 % SMARTSIG:1 Application Topical 2-3 Times Daily    . losartan (COZAAR) 100 MG tablet Take 100 mg by mouth daily.    Marland Kitchen LOSARTAN POTASSIUM PO Take 100 mg by mouth daily.    . potassium chloride (KLOR-CON) 10 MEQ tablet Take 10 mEq by mouth daily.    Marland Kitchen tobramycin (TOBREX) 0.3 % ophthalmic solution      No facility-administered medications prior to visit.    PAST MEDICAL HISTORY: Past Medical History:  Diagnosis Date  . Arthritis   . Atrophic vaginitis   . Basal cell carcinoma   . Cervical dysplasia   . Complication of anesthesia    pseudocholinesterase deficiency-hard to wake up  . Falls   . GERD (gastroesophageal reflux disease)   . Hypertension   . IBS (irritable bowel syndrome)   . Jaw atrophy   . Limb-girdle muscular dystrophy ([place]) 07/05/2013  . Macular hole of left eye   . Muscular dystrophy ([place])   . Osteoporosis    pelvic fracture  . Pseudocholinesterase deficiency   . Scoliosis     PAST SURGICAL HISTORY: Past Surgical History:  Procedure Laterality Date  . ABDOMINAL HYSTERECTOMY     TAH BSO  . BREAST LUMPECTOMY  1971   benign  . CESAREAN SECTION     x2  . COLPOSCOPY    . EYE SURGERY  2003   left  . TOTAL ABDOMINAL HYSTERECTOMY W/ BILATERAL SALPINGOOPHORECTOMY  1981  . TRIGGER FINGER RELEASE  07/28/2012   Procedure: RELEASE TRIGGER FINGER/A-1 PULLEY;  Surgeon: Cammie Sickle., MD;  Location: [facility];  Service: Orthopedics;  Laterality: Right;  EXCISION CYST RIGHT LONG A-1 RELEASE A-1 RIGHT LONG     FAMILY HISTORY: Family History  Problem Relation Age of Onset  . Hypertension Sister     SOCIAL HISTORY: Social History   Socioeconomic History  . Marital status: Divorced  Spouse name: Not on file  . Number of children: 3  . Years of education: 65  . Highest education level:  Not on file  Occupational History  . Occupation: retired    Comment: Theatre stage manager at [employer]  . Smoking status: Never Smoker  . Smokeless tobacco: Never Used  Vaping Use  . Vaping Use: Never used  Substance and Sexual Activity  . Alcohol use: No    Alcohol/week: 0.0 standard drinks    Comment: rare  . Drug use: No  . Sexual activity: Never    Birth control/protection: Surgical  Other Topics Concern  . Not on file  Social History Narrative   Patient lives at home alone and she is divorced. Patient is retired.   Right handed.   Caffeine- one cup daily.   Uses walker.   Grown children, live 7 miles away.     Social Determinants of Health   Financial Resource Strain:   . Difficulty of Paying Living Expenses: Not on file  Food Insecurity:   . Worried About Charity fundraiser in the Last Year: Not on file  . Ran Out of Food in the Last Year: Not on file  Transportation Needs:   . Lack of Transportation (Medical): Not on file  . Lack of Transportation (Non-Medical): Not on file  Physical Activity:   . Days of Exercise per Week: Not on file  . Minutes of Exercise per Session: Not on file  Stress:   . Feeling of Stress : Not on file  Social Connections:   . Frequency of Communication with Friends and Family: Not on file  . Frequency of Social Gatherings with Friends and Family: Not on file  . Attends Religious Services: Not on file  . Active Member of Clubs or Organizations: Not on file  . Attends Archivist Meetings: Not on file  . Marital Status: Not on file  Intimate Partner Violence:   . Fear of Current or Ex-Partner: Not on file  . Emotionally Abused: Not on file  . Physically Abused: Not on file  . Sexually Abused: Not on file      PHYSICAL EXAM  Vitals:   06/23/20 1033  BP: (!) 148/70  Pulse: 66  Weight: 161 lb (73 kg)  Height: 5' (1.524 m)   Body mass index is 31.44 kg/m.  Generalized: Well developed, in no acute  distress  Cardiology: normal rate and rhythm, no murmur noted Respiratory: clear to auscultation bilaterally  Neurological examination  Mentation: Alert oriented to time, place, history taking. Follows all commands speech and language fluent Cranial nerve II-XII: Pupils were equal round reactive to light. Extraocular movements were full, visual field were full on confrontational test. Facial sensation and strength were normal. Uvula tongue midline. Head turning and shoulder shrug  were normal and symmetric. Motor: The motor testing reveals 5 over 5 strength of bilateral extremities, 4/5 bilateral lowers. Good symmetric motor tone is noted throughout.  Sensory: Sensory testing is intact to soft touch on all 4 extremities. No evidence of extinction is noted.  Coordination: Cerebellar testing reveals good finger-nose-finger  Gait and station: Gait is fairly steady with Rolator, able to push with arms to standing position,  Reflexes: Deep tendon reflexes are brisk but symmetric bilaterally.   DIAGNOSTIC DATA (LABS, IMAGING, TESTING) - I reviewed patient records, labs, notes, testing and imaging myself where available.  No flowsheet data found.   Lab Results  Component Value Date   HGB  14.0 07/28/2012      Component Value Date/Time   NA 137 08/28/2012 1249   K 3.6 08/28/2012 1249   CL 102 08/28/2012 1249   CO2 27 08/28/2012 1249   GLUCOSE 115 (H) 08/28/2012 1249   BUN 21 08/28/2012 1249   CREATININE 0.76 08/28/2012 1249   CALCIUM 10.1 08/28/2012 1249   CALCIUM 10.1 08/28/2012 1249   PROT 6.8 08/28/2012 1249   ALBUMIN 4.3 08/28/2012 1249   AST 34 08/28/2012 1249   ALT 35 08/28/2012 1249   ALKPHOS 41 08/28/2012 1249   BILITOT 0.4 08/28/2012 1249   GFRNONAA 82 (L) 07/27/2012 1100   GFRAA >90 07/27/2012 1100   No results found for: CHOL, HDL, LDLCALC, LDLDIRECT, TRIG, CHOLHDL No results found for: HGBA1C No results found for: VITAMINB12 No results found for: TSH     ASSESSMENT  AND PLAN 86 y.o. year old female  has a past medical history of Arthritis, Atrophic vaginitis, Basal cell carcinoma, Cervical dysplasia, Complication of anesthesia, Falls, GERD (gastroesophageal reflux disease), Hypertension, IBS (irritable bowel syndrome), Jaw atrophy, Limb-girdle muscular dystrophy ([place]) (07/05/2013), Macular hole of left eye, Muscular dystrophy ([place]), Osteoporosis, Pseudocholinesterase deficiency, and Scoliosis. here with     ICD-10-CM   1. Limb-girdle muscular dystrophy ([place])  G71.09   2. Scoliosis (and kyphoscoliosis), idiopathic  M41.20     Schuyler is doing fairly well, today. She denies new or worsening symptoms. Gait is stable with Rolator. No falls. She does not feel PT has helped in the past. She was encouraged to continue healthy lifestyle habits. Fall and safety precautions discussed. She will follow up regularly with care team as directed. She will follow up with [place] in 1 year, sooner if needed.   No orders of the defined types were placed in this encounter.    No orders of the defined types were placed in this encounter.     I spent 30 minutes with the patient. 50% of this time was spent counseling and educating patient on plan of care and medications.    Debbora Presto, FNP-C 06/25/2020, 7:21 AM [facility] [address] [place], [place] [address] [phone]

## 2020-06-23 NOTE — Patient Instructions (Addendum)
We will continue current treatment plan.   Stay well hydrated. Well balanced diet and regular exercise encouraged.   Continue close follow up with PCP and care team.   Follow up with [place] in 1 year, sooner if needed   Muscular Dystrophy  Muscular dystrophy is any condition in a group of diseases that are characterized by muscle weakness and muscle loss. This can occur in the muscles that are used for body movements (voluntary muscles) or in the muscles that are used for breathing and heartbeat (involuntary muscles). Most types of muscular dystrophy begin to cause symptoms during childhood. Some types do not start causing symptoms until later in life. The most common types of muscular dystrophy include:  Duchenne. This is the most common type. Symptoms usually begin by age 51 and start in the upper arms and legs.  Deneise Lever. This type is usually milder than Duchenne. Symptoms usually begin by age 4 and start in the arms and legs.  Myotonic. This is the most common type that affects adults. Symptoms usually begin by age 59. This type makes muscles tight and unable to relax. It usually starts in the arms, face, hands, or legs.  Limb-girdle. Weakness may begin in childhood or adult years. This type starts in the hips and shoulders.  Facioscapulohumeral. This type affects the face and shoulders. Symptoms may start in childhood or adult years.  Congenital. This type is present at birth. It causes muscle weakness in the upper body and legs. What are the causes? This condition is caused by changes (mutations) in the genes that are needed to make proteins for healthy muscles. Different mutations cause different types of muscular dystrophy. These gene mutations may:  Be present at birth (congenital). The mutation is passed from parent to child (inherited).  Develop on their own (spontaneous). This occurs when the baby is growing in the womb and the mother is not a carrier of the mutation. What  increases the risk? This condition is more likely to develop in:  People who have a family history of the condition.  Males. The most common type of muscular dystrophy occurs more often in boys than in girls. What are the signs or symptoms? Symptoms of this condition vary, depending on the type of muscular dystrophy that you have. Early signs and symptoms of muscular dystrophy in childhood may include:  Frequent falling or clumsiness.  Having a hard time getting up into a standing position.  Trouble running, walking, jumping, or climbing stairs.  A waddling walk.  Walking on toes.  Painful or stiff muscles. Symptoms of more advanced muscular dystrophy may include:  Becoming unable to walk.  Trouble pushing things like a wagon or tricycle.  Curving of the spine.  Trouble swallowing.  Trouble breathing.  Heart failure. How is this diagnosed? This condition may be diagnosed with:  A physical exam.  Your medical history.  Blood tests.  Tests to measure the electrical activity of muscles (electromyogram).  A procedure to remove a sample of muscle fibers to be examined under a microscope (muscle biopsy).  Heart and lung function tests. How is this treated? There is no cure for this condition. However, treatment may slow worsening of the disease and help you to stay active. Treatment may include:  Occupational, physical, and speech therapy.  Assistive devices, such as a brace, cane, walker, or wheelchair.  Medicines to: ? Reduce swelling (steroids). ? Improve heart function and bone health.  Surgery to correct or prevent complications, such as eye  or heart problems.  Counseling to support your mental health.  A feeding tube to assist with nutrition.  A breathing machine (ventilator). Follow these instructions at home: Learn as much as you can about muscular dystrophy, and work closely with your treatment team. Home care needs may change over time. Eating  and drinking   Eat a healthy, balanced diet.  Do not try to treat this condition with special diets or supplements. These do not help to relieve the symptoms of this condition. Activity  Try to stay physically active. Ask your health care provider what level of physical activity is safe for you.  Do physical therapy exercises as directed by a physical therapist. Safety   Take steps to prevent falls. If possible: ? Remodel a bathroom to make it accessible for a wheelchair. ? Add a chair or bench to the shower. ? Install grab bars for your tub, shower, and toilet. ? Lower beds to make them easier to get in and out. ? Install assistive devices throughout your home to help you when going from a seated to a standing position.  If necessary, make changes in your home to make access easier: ? Cheri Kearns doors and install a wheelchair ramp. ? Change doorknobs to lever handles to make doors easier to open. ? Install pull-out shelving in cabinets. ? Place items such as plates and cups in low drawers. ? Consider "smart home" controls that allow you to turn off lights if you are seated or have left a room. General instructions   Take over-the-counter and prescription medicines only as told by the health care provider.  Stay up to date on all immunizations, including the yearly (annual) flu vaccine.  Try to maintain a healthy weight. You may need to follow a low-calorie eating plan to maintain your weight if you cannot be active.  Do not use any products that contain nicotine or tobacco, such as cigarettes and e-cigarettes. If you need help quitting, ask a health care provider.  Keep all follow-up visits as directed by your health care provider. This is important. Where to find more information  Ask your health care provider to suggest resources in your area for emotional or caregiving support.  Muscular Dystrophy Association: CallRank.tn Contact a health care provider if:  Your symptoms  change.  You have trouble taking care of yourself at home. Get help right away if:  You have problems with breathing or swallowing.  It is no longer safe or possible for you to care for yourself at home. Summary  Muscular dystrophy is any condition in a group of diseases that are characterized by muscle weakness and muscle loss.  Symptoms of this condition vary, depending on the type of muscular dystrophy.  There is no cure for muscular dystrophy. However, treatment may slow the worsening of the disease.  You may need to make changes to your home to make it safer and easier for you to care for yourself. This information is not intended to replace advice given to you by your health care provider. Make sure you discuss any questions you have with your health care provider. Document Revised: 10/26/2017 Document Reviewed: 10/25/2017 Elsevier Patient Education  2020 [employer].

## 2020-06-25 ENCOUNTER — Encounter: Payer: Self-pay | Admitting: Family Medicine

## 2020-06-25 ENCOUNTER — Ambulatory Visit: Payer: PPO | Admitting: Podiatry

## 2020-07-08 DIAGNOSIS — Z1231 Encounter for screening mammogram for malignant neoplasm of breast: Secondary | ICD-10-CM | POA: Diagnosis not present

## 2020-07-09 DIAGNOSIS — Z85828 Personal history of other malignant neoplasm of skin: Secondary | ICD-10-CM | POA: Diagnosis not present

## 2020-07-09 DIAGNOSIS — D2272 Melanocytic nevi of left lower limb, including hip: Secondary | ICD-10-CM | POA: Diagnosis not present

## 2020-07-09 DIAGNOSIS — L218 Other seborrheic dermatitis: Secondary | ICD-10-CM | POA: Diagnosis not present

## 2020-07-09 DIAGNOSIS — D225 Melanocytic nevi of trunk: Secondary | ICD-10-CM | POA: Diagnosis not present

## 2020-07-09 DIAGNOSIS — D2271 Melanocytic nevi of right lower limb, including hip: Secondary | ICD-10-CM | POA: Diagnosis not present

## 2020-07-09 DIAGNOSIS — D485 Neoplasm of uncertain behavior of skin: Secondary | ICD-10-CM | POA: Diagnosis not present

## 2020-07-09 DIAGNOSIS — L820 Inflamed seborrheic keratosis: Secondary | ICD-10-CM | POA: Diagnosis not present

## 2020-07-09 DIAGNOSIS — L570 Actinic keratosis: Secondary | ICD-10-CM | POA: Diagnosis not present

## 2020-07-09 DIAGNOSIS — D2262 Melanocytic nevi of left upper limb, including shoulder: Secondary | ICD-10-CM | POA: Diagnosis not present

## 2020-07-09 DIAGNOSIS — L821 Other seborrheic keratosis: Secondary | ICD-10-CM | POA: Diagnosis not present

## 2020-07-09 DIAGNOSIS — D2261 Melanocytic nevi of right upper limb, including shoulder: Secondary | ICD-10-CM | POA: Diagnosis not present

## 2020-07-09 DIAGNOSIS — L814 Other melanin hyperpigmentation: Secondary | ICD-10-CM | POA: Diagnosis not present

## 2020-07-19 DIAGNOSIS — Z23 Encounter for immunization: Secondary | ICD-10-CM | POA: Diagnosis not present

## 2020-07-28 ENCOUNTER — Other Ambulatory Visit (HOSPITAL_COMMUNITY): Payer: Self-pay | Admitting: *Deleted

## 2020-07-29 ENCOUNTER — Ambulatory Visit (HOSPITAL_COMMUNITY)
Admission: RE | Admit: 2020-07-29 | Discharge: 2020-07-29 | Disposition: A | Discharge status: Home / Self Care | Payer: PPO | Source: Ambulatory Visit | Attending: Endocrinology | Admitting: Endocrinology

## 2020-07-29 ENCOUNTER — Other Ambulatory Visit: Payer: Self-pay

## 2020-07-30 DIAGNOSIS — H353221 Exudative age-related macular degeneration, left eye, with active choroidal neovascularization: Secondary | ICD-10-CM | POA: Diagnosis not present

## 2020-09-08 ENCOUNTER — Encounter (HOSPITAL_COMMUNITY)
Admission: RE | Admit: 2020-09-08 | Discharge: 2020-09-08 | Disposition: A | Discharge status: Home / Self Care | Payer: PPO | Source: Ambulatory Visit | Attending: Endocrinology | Admitting: Endocrinology

## 2020-09-08 DIAGNOSIS — M810 Age-related osteoporosis without current pathological fracture: Secondary | ICD-10-CM | POA: Insufficient documentation

## 2020-09-08 MED ORDER — DENOSUMAB 60 MG/ML ~~LOC~~ SOSY
PREFILLED_SYRINGE | SUBCUTANEOUS | Status: AC
  Filled 2020-09-08: qty 1

## 2020-09-08 MED ORDER — DENOSUMAB 60 MG/ML ~~LOC~~ SOSY
60.0000 mg | PREFILLED_SYRINGE | Freq: Once | SUBCUTANEOUS | Status: AC
  Administered 2020-09-08: 60 mg via SUBCUTANEOUS

## 2020-09-17 DIAGNOSIS — H353221 Exudative age-related macular degeneration, left eye, with active choroidal neovascularization: Secondary | ICD-10-CM | POA: Diagnosis not present

## 2020-11-05 DIAGNOSIS — H353221 Exudative age-related macular degeneration, left eye, with active choroidal neovascularization: Secondary | ICD-10-CM | POA: Diagnosis not present

## 2020-12-01 DIAGNOSIS — E785 Hyperlipidemia, unspecified: Secondary | ICD-10-CM | POA: Diagnosis not present

## 2020-12-01 DIAGNOSIS — R7301 Impaired fasting glucose: Secondary | ICD-10-CM | POA: Diagnosis not present

## 2020-12-01 DIAGNOSIS — E559 Vitamin D deficiency, unspecified: Secondary | ICD-10-CM | POA: Diagnosis not present

## 2020-12-08 DIAGNOSIS — N1831 Chronic kidney disease, stage 3a: Secondary | ICD-10-CM | POA: Diagnosis not present

## 2020-12-08 DIAGNOSIS — E785 Hyperlipidemia, unspecified: Secondary | ICD-10-CM | POA: Diagnosis not present

## 2020-12-08 DIAGNOSIS — I87323 Chronic venous hypertension (idiopathic) with inflammation of bilateral lower extremity: Secondary | ICD-10-CM | POA: Diagnosis not present

## 2020-12-08 DIAGNOSIS — Z1331 Encounter for screening for depression: Secondary | ICD-10-CM | POA: Diagnosis not present

## 2020-12-08 DIAGNOSIS — E559 Vitamin D deficiency, unspecified: Secondary | ICD-10-CM | POA: Diagnosis not present

## 2020-12-08 DIAGNOSIS — G629 Polyneuropathy, unspecified: Secondary | ICD-10-CM | POA: Diagnosis not present

## 2020-12-08 DIAGNOSIS — I129 Hypertensive chronic kidney disease with stage 1 through stage 4 chronic kidney disease, or unspecified chronic kidney disease: Secondary | ICD-10-CM | POA: Diagnosis not present

## 2020-12-08 DIAGNOSIS — R7301 Impaired fasting glucose: Secondary | ICD-10-CM | POA: Diagnosis not present

## 2020-12-08 DIAGNOSIS — Z1389 Encounter for screening for other disorder: Secondary | ICD-10-CM | POA: Diagnosis not present

## 2020-12-08 DIAGNOSIS — G7109 Other specified muscular dystrophies: Secondary | ICD-10-CM | POA: Diagnosis not present

## 2020-12-08 DIAGNOSIS — I2510 Atherosclerotic heart disease of native coronary artery without angina pectoris: Secondary | ICD-10-CM | POA: Diagnosis not present

## 2020-12-08 DIAGNOSIS — Z0000 Encounter for general adult medical examination without abnormal findings: Secondary | ICD-10-CM | POA: Diagnosis not present

## 2020-12-08 DIAGNOSIS — M810 Age-related osteoporosis without current pathological fracture: Secondary | ICD-10-CM | POA: Diagnosis not present

## 2020-12-08 DIAGNOSIS — I10 Essential (primary) hypertension: Secondary | ICD-10-CM | POA: Diagnosis not present

## 2020-12-08 DIAGNOSIS — R82998 Other abnormal findings in urine: Secondary | ICD-10-CM | POA: Diagnosis not present

## 2020-12-10 DIAGNOSIS — H353221 Exudative age-related macular degeneration, left eye, with active choroidal neovascularization: Secondary | ICD-10-CM | POA: Diagnosis not present

## 2020-12-22 DIAGNOSIS — H35372 Puckering of macula, left eye: Secondary | ICD-10-CM | POA: Diagnosis not present

## 2020-12-22 DIAGNOSIS — H35363 Drusen (degenerative) of macula, bilateral: Secondary | ICD-10-CM | POA: Diagnosis not present

## 2020-12-22 DIAGNOSIS — H35453 Secondary pigmentary degeneration, bilateral: Secondary | ICD-10-CM | POA: Diagnosis not present

## 2020-12-22 DIAGNOSIS — Z961 Presence of intraocular lens: Secondary | ICD-10-CM | POA: Diagnosis not present

## 2020-12-22 DIAGNOSIS — H353112 Nonexudative age-related macular degeneration, right eye, intermediate dry stage: Secondary | ICD-10-CM | POA: Diagnosis not present

## 2020-12-22 DIAGNOSIS — H353221 Exudative age-related macular degeneration, left eye, with active choroidal neovascularization: Secondary | ICD-10-CM | POA: Diagnosis not present

## 2021-01-15 DIAGNOSIS — H353221 Exudative age-related macular degeneration, left eye, with active choroidal neovascularization: Secondary | ICD-10-CM | POA: Diagnosis not present

## 2021-02-25 DIAGNOSIS — H353221 Exudative age-related macular degeneration, left eye, with active choroidal neovascularization: Secondary | ICD-10-CM | POA: Diagnosis not present

## 2021-02-26 DIAGNOSIS — I129 Hypertensive chronic kidney disease with stage 1 through stage 4 chronic kidney disease, or unspecified chronic kidney disease: Secondary | ICD-10-CM | POA: Diagnosis not present

## 2021-02-26 DIAGNOSIS — R7301 Impaired fasting glucose: Secondary | ICD-10-CM | POA: Diagnosis not present

## 2021-02-26 DIAGNOSIS — N1831 Chronic kidney disease, stage 3a: Secondary | ICD-10-CM | POA: Diagnosis not present

## 2021-02-26 DIAGNOSIS — I2510 Atherosclerotic heart disease of native coronary artery without angina pectoris: Secondary | ICD-10-CM | POA: Diagnosis not present

## 2021-02-26 DIAGNOSIS — I10 Essential (primary) hypertension: Secondary | ICD-10-CM | POA: Diagnosis not present

## 2021-03-13 ENCOUNTER — Other Ambulatory Visit (HOSPITAL_COMMUNITY): Payer: Self-pay | Admitting: *Deleted

## 2021-03-16 ENCOUNTER — Other Ambulatory Visit: Payer: Self-pay

## 2021-03-16 ENCOUNTER — Ambulatory Visit (HOSPITAL_COMMUNITY)
Admission: RE | Admit: 2021-03-16 | Discharge: 2021-03-16 | Disposition: A | Discharge status: Home / Self Care | Payer: PPO | Source: Ambulatory Visit | Attending: Endocrinology | Admitting: Endocrinology

## 2021-03-16 DIAGNOSIS — M810 Age-related osteoporosis without current pathological fracture: Secondary | ICD-10-CM | POA: Diagnosis not present

## 2021-03-16 MED ORDER — DENOSUMAB 60 MG/ML ~~LOC~~ SOSY
PREFILLED_SYRINGE | SUBCUTANEOUS | Status: AC
  Administered 2021-03-16: 60 mg via SUBCUTANEOUS
  Filled 2021-03-16: qty 1

## 2021-03-16 MED ORDER — DENOSUMAB 60 MG/ML ~~LOC~~ SOSY: 60.0000 mg | PREFILLED_SYRINGE | Freq: Once | SUBCUTANEOUS | Status: AC

## 2021-04-08 DIAGNOSIS — H353221 Exudative age-related macular degeneration, left eye, with active choroidal neovascularization: Secondary | ICD-10-CM | POA: Diagnosis not present

## 2021-05-26 DIAGNOSIS — H353221 Exudative age-related macular degeneration, left eye, with active choroidal neovascularization: Secondary | ICD-10-CM | POA: Diagnosis not present

## 2021-06-08 DIAGNOSIS — I87323 Chronic venous hypertension (idiopathic) with inflammation of bilateral lower extremity: Secondary | ICD-10-CM | POA: Diagnosis not present

## 2021-06-08 DIAGNOSIS — I700 Atherosclerosis of aorta: Secondary | ICD-10-CM | POA: Diagnosis not present

## 2021-06-08 DIAGNOSIS — N1831 Chronic kidney disease, stage 3a: Secondary | ICD-10-CM | POA: Diagnosis not present

## 2021-06-08 DIAGNOSIS — M810 Age-related osteoporosis without current pathological fracture: Secondary | ICD-10-CM | POA: Diagnosis not present

## 2021-06-08 DIAGNOSIS — E785 Hyperlipidemia, unspecified: Secondary | ICD-10-CM | POA: Diagnosis not present

## 2021-06-08 DIAGNOSIS — I2510 Atherosclerotic heart disease of native coronary artery without angina pectoris: Secondary | ICD-10-CM | POA: Diagnosis not present

## 2021-06-08 DIAGNOSIS — K760 Fatty (change of) liver, not elsewhere classified: Secondary | ICD-10-CM | POA: Diagnosis not present

## 2021-06-08 DIAGNOSIS — I5189 Other ill-defined heart diseases: Secondary | ICD-10-CM | POA: Diagnosis not present

## 2021-06-08 DIAGNOSIS — G629 Polyneuropathy, unspecified: Secondary | ICD-10-CM | POA: Diagnosis not present

## 2021-06-08 DIAGNOSIS — R7301 Impaired fasting glucose: Secondary | ICD-10-CM | POA: Diagnosis not present

## 2021-06-08 DIAGNOSIS — K589 Irritable bowel syndrome without diarrhea: Secondary | ICD-10-CM | POA: Diagnosis not present

## 2021-06-08 DIAGNOSIS — I129 Hypertensive chronic kidney disease with stage 1 through stage 4 chronic kidney disease, or unspecified chronic kidney disease: Secondary | ICD-10-CM | POA: Diagnosis not present

## 2021-06-09 DIAGNOSIS — H353112 Nonexudative age-related macular degeneration, right eye, intermediate dry stage: Secondary | ICD-10-CM | POA: Diagnosis not present

## 2021-06-09 DIAGNOSIS — H52203 Unspecified astigmatism, bilateral: Secondary | ICD-10-CM | POA: Diagnosis not present

## 2021-06-09 DIAGNOSIS — H353221 Exudative age-related macular degeneration, left eye, with active choroidal neovascularization: Secondary | ICD-10-CM | POA: Diagnosis not present

## 2021-06-09 DIAGNOSIS — Z961 Presence of intraocular lens: Secondary | ICD-10-CM | POA: Diagnosis not present

## 2021-06-10 ENCOUNTER — Ambulatory Visit
Admission: RE | Admit: 2021-06-10 | Discharge: 2021-06-10 | Disposition: A | Discharge status: Home / Self Care | Payer: PPO | Source: Ambulatory Visit | Attending: Endocrinology | Admitting: Endocrinology

## 2021-06-10 ENCOUNTER — Other Ambulatory Visit: Payer: Self-pay | Admitting: Endocrinology

## 2021-06-10 DIAGNOSIS — G4453 Primary thunderclap headache: Secondary | ICD-10-CM | POA: Diagnosis not present

## 2021-06-10 DIAGNOSIS — R200 Anesthesia of skin: Secondary | ICD-10-CM

## 2021-06-10 DIAGNOSIS — R519 Headache, unspecified: Secondary | ICD-10-CM | POA: Diagnosis not present

## 2021-06-10 DIAGNOSIS — I10 Essential (primary) hypertension: Secondary | ICD-10-CM | POA: Diagnosis not present

## 2021-06-10 DIAGNOSIS — H5712 Ocular pain, left eye: Secondary | ICD-10-CM | POA: Diagnosis not present

## 2021-06-22 NOTE — Patient Instructions (Signed)
Below is our plan:  We will continue to monitor symptoms. I will send a referral to PT and will try to get someone to come out to you.   Please make sure you are staying well hydrated. I recommend 50-60 ounces daily. Well balanced diet and regular exercise encouraged. Consistent sleep schedule with 6-8 hours recommended.   Please continue follow up with care team as directed.   Follow up with Dr Brett Fairy in 6 months  You may receive a survey regarding today's visit. I encourage you to leave honest feed back as I do use this information to improve patient care. Thank you for seeing me today!

## 2021-06-22 NOTE — Progress Notes (Signed)
[name]  REASON FOR VISIT: follow up HISTORY FROM: patient  Chief Complaint  Patient presents with   Follow-up    Room EMG 3. Pt alone. Pt reports the only major change in her health is her macular degeneration, she's getting injections for this. She states that her legs are weak and toes are numb.       HISTORY OF PRESENT ILLNESS:  06/23/21 ALL: Catherine Harding returns for follow up. She feels that she is doing fairly well. No significant changes. She continues to have bilateral lower extremity weakness and low back/hip discomfort. She has numbness of her toes, bilaterally, but is not overly bothered by this. She is using her Rolator or walker at all times. She continues to live alone. She is able to maintain her home but has difficulty cleaning it. She is not able to stand up for longer than about 5 minutes at a time. She can perform ADLs independently. She manages medicaitons and finances. Her grandson mows her yard and her nephew calls every Sunday to check in. She has a neighbor that helps with driving to ophthalmology for eye injections. She can drive short, local distances if she has to. No falls. She has lost her sister at age 41, two nephews this year. She lost her church friend and significant other in the past 2-3 years. She is no longer gong to church.   06/23/2020 ALL:  Catherine Harding is a 87 y.o. female here today for follow up. She feels that increased swelling of bilateral legs have decreased her mobility. She continues to live alone. She is able to perform ADL's independently. She can not get up stairs. She continues to use her Rolator. She has great support with her family. She has family that helps with lawn work. She someone helping her with cleaning her home. She is no longer driving long distances due to vision changes. She is followed by ophthalmology regularly. She denies any falls. She feels that she is doing failry well, overall. She is  followed closely by PCP.   HISTORY: (copied from Dr Dohmeier's note on 06/20/2019)  I have the pleasure of seeing Catherine Harding today meanwhile 87 year old Caucasian right-handed lady that is still able to ambulate but not without assistance.  She is now using a walker or similar frame.  She has been followed in this office for the last 14 years, and has an adult onset limb-girdle muscular dystrophy.  She is using the walker even inside her home.  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  In the past visits we often spoke also about abdominal and flank pain but a CT of the abdomen did not confirm diverticulosis. IBS- diarrhea was diagnosed, has been on diuretics also- she has ankle edema. No varicosis.  Her feet are swollen, not weeping.  She can't put compression stocking on without assistance. As soon as she goes to bed she starts Burping. She already excluded onions, peppers and leeks from her diet.  Dr Forde Dandy ordered echo and vein examination to no results. She suffers from hypokalemia, too.  Nobody has given her an explanation why the legs keep swelling, painfully.    Interval history from 06/01/2017, I have the pleasure of seeing Catherine Harding scheduled yearly revisit, her last 2 appointments were with NP Cecille Rubin. The patient states that her gait has not further deteriorated, she is using a seated walker, she does have abdominal and flank pain  and was thought to suffer from diverticulitis, but his CT of the abdomen has not confirmed the presence of inflammatory changes. She now contracted a urinary tract infection, she has had a difficult summer due to abnormal digestion and infection. She is seeing dr Paulita Fujita GI @ [facility] today.  The patient had developed hip pain in the year 2014 and had multiple falls that year, but steroid injections into the hip has helped greatly and she had no recurrence since. Her last fall was last November.  She now uses the walker  regularly outside of the home, in home she " wobbles ' from wall to furniture. She has had dizzyspells. Improved after d/c Hyoscyamines.   Catherine Harding,  a 87 year old female returns for follow-up on her limb girdle dystrophy and associated gait disorder. She was last seen by me on 07/05/2013. 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 The scoliosis has led to some stiffness in her lower extremities problems to arise from a chair or from a seated position. The stiffness is worse if she remained seated for a longer period of time. She reports that just driving to town for 30 or 40 minutes would be enough to have problems. She is however still driving, her problem is to enter and leave the car. In 2007 this established patient underwent a EMG and nerve conduction study, which showed only  a mild peripheral neuropathy. Her neurologic symptoms are otherwise unchanged- she has the expected progression of gait difficulties,  she does have a loss of balance /poor sense of balance and she is walking with a walker to stabilize. She reports using a cane at home. Within the boundaries of her home she can maneuver by holding onto the wall or furniture if needed. She does not use an assistive device per se indoors. She reports sciatic nerve pain. Hip pain- treated with meloxicam and prednisone  But only found relief after sterid injections.    she has had 3 falls in the 2014 , but none in 2015- and all occurred when she was not using her cane outdoors.    HPI _ 2016 ;Patients sister was never diagnosed with Limb Girdle-Muscular Dystrophy, but died wheelchair bound at age 17 in 90. Another sister is still alive at age 57 and in good shape. Catherine Harding is now 39. Neither child nor her grandchild have any signs of muscle atrophy.  The patient has a history of walking " funny " for probably over a decade may be to. But she was only diagnosed after presenting to the neurology office  9 years ago. There is a family history as her sister was probably affected by the same dystrophy type. None of her children have shown any symptoms at this time and her grandchildren are unaffected also.  Two of her children have  scoliosis , which can be an early symptom. It was her then 60 year old grandson who spotted and named her gait " Dynegy "  She does report left-sided sciatica and she has a very palpable tender spot at L5 L4. The patella reflex is preserved. She found some relief after an injection. I would like for her to have physical therapy and perhaps massage therapy. Will refer to PT , she had not been seen by neuro rehab.      REVIEW OF SYSTEMS: Out of a complete 14 system review of symptoms, the patient complains only of the following symptoms, chronic pain, gait  abnormality, imbalance, and all other reviewed systems are negative.   ALLERGIES: Allergies  Allergen Reactions   Anesthetics, Amide    Celebrex [Celecoxib]    Ciprofloxacin Swelling   Codeine Nausea And Vomiting   Morphine And Related Nausea And Vomiting   Sulfa Antibiotics     HOME MEDICATIONS: Outpatient Medications Prior to Visit  Medication Sig Dispense Refill   Artificial Tear Solution (SYSTANE CONTACTS) SOLN Apply to eye.     bacitracin ophthalmic ointment Place [id number] application into both eyes 3 (three) times daily.     Calcium Carbonate Antacid (TUMS PO) Take by mouth daily as needed. chewable     Calcium Carbonate-Vit D-Min (CALTRATE PLUS PO) Take by mouth.       denosumab (PROLIA) 60 MG/ML SOLN injection Inject 60 mg into the skin every 6 (six) months. Administer in upper arm, thigh, or abdomen     diltiazem (CARDIZEM CD) 240 MG 24 hr capsule Take 240 mg by mouth daily.       doxycycline (VIBRA-TABS) 100 MG tablet Take 100 mg by mouth 2 (two) times daily.     ergocalciferol (VITAMIN D2) 50000 UNITS capsule Take 50,000 Units by mouth 2 (two) times a week.      famotidine (PEPCID) 20 MG tablet Take  20 mg by mouth 2 (two) times daily.     fluticasone (FLONASE) 50 MCG/ACT nasal spray      FLUTICASONE PROPIONATE, NASAL, NA Place into the nose. 2 sprays 1/daily     furosemide (LASIX) 20 MG tablet Take 20 mg by mouth 2 (two) times daily. Patient states she takes 2 tablets in the morning and 1 tablet at 2pm each day.     hyoscyamine (LEVSIN SL) 0.125 MG SL tablet      lansoprazole (PREVACID) 30 MG capsule Take 30 mg by mouth 2 (two) times daily before a meal.      loperamide (IMODIUM) 2 MG capsule Take 2 mg by mouth as needed for diarrhea or loose stools.     losartan-hydrochlorothiazide (HYZAAR) 100-12.5 MG per tablet Take 12.5 tablets by mouth daily.     Multiple Vitamins-Minerals (PRESERVISION/LUTEIN) CAPS Take 2 capsules by mouth daily.     Omega-3 Fatty Acids (FISH OIL) 1200 MG CAPS Take 2 capsules by mouth daily.     Polyethyl Glycol-Propyl Glycol 0.4-0.3 % SOLN Apply to eye.       potassium chloride (KLOR-CON) 10 MEQ tablet Take 10 mEq by mouth daily.     Probiotic Product (ALIGN PO) Take by mouth.       sodium chloride (OCEAN) 0.65 % nasal spray Place 1 spray into the nose as needed for congestion.     triamcinolone cream (KENALOG) 0.1 % SMARTSIG:1 Application Topical 2-3 Times Daily     No facility-administered medications prior to visit.    PAST MEDICAL HISTORY: Past Medical History:  Diagnosis Date   Arthritis    Atrophic vaginitis    Basal cell carcinoma    Cervical dysplasia    Complication of anesthesia    pseudocholinesterase deficiency-hard to wake up   Falls    GERD (gastroesophageal reflux disease)    Hypertension    IBS (irritable bowel syndrome)    Jaw atrophy    Limb-girdle muscular dystrophy ([place]) 07/05/2013   Macular hole of left eye    Muscular dystrophy ([place])    Osteoporosis    pelvic fracture   Pseudocholinesterase deficiency    Scoliosis     PAST SURGICAL HISTORY: Past Surgical History:  Procedure Laterality Date   ABDOMINAL HYSTERECTOMY     TAH  BSO   BREAST LUMPECTOMY  1971   benign   CESAREAN SECTION     x2   COLPOSCOPY     EYE SURGERY  2003   left   TOTAL ABDOMINAL HYSTERECTOMY W/ BILATERAL SALPINGOOPHORECTOMY  1981   TRIGGER FINGER RELEASE  07/28/2012   Procedure: RELEASE TRIGGER FINGER/A-1 PULLEY;  Surgeon: Cammie Sickle., MD;  Location: [facility];  Service: Orthopedics;  Laterality: Right;  EXCISION CYST RIGHT LONG A-1 RELEASE A-1 RIGHT LONG     FAMILY HISTORY: Family History  Problem Relation Age of Onset   Hypertension Sister     SOCIAL HISTORY: Social History   Socioeconomic History   Marital status: Divorced    Spouse name: Not on file   Number of children: 3   Years of education: 13   Highest education level: Not on file  Occupational History   Occupation: retired    Comment: Theatre stage manager at [employer]  Tobacco Use   Smoking status: Never   Smokeless tobacco: Never  Vaping Use   Vaping Use: Never used  Substance and Sexual Activity   Alcohol use: No    Alcohol/week: 0.0 standard drinks    Comment: rare   Drug use: No   Sexual activity: Never    Birth control/protection: Surgical  Other Topics Concern   Not on file  Social History Narrative   Patient lives at home alone and she is divorced. Patient is retired.   Right handed.   Caffeine- one cup daily.   Uses walker.   Grown children, live 7 miles away.     Social Determinants of Health   Financial Resource Strain: Not on file  Food Insecurity: Not on file  Transportation Needs: Not on file  Physical Activity: Not on file  Stress: Not on file  Social Connections: Not on file  Intimate Partner Violence: Not on file      PHYSICAL EXAM  Vitals:   06/23/21 1025  BP: 131/73  Pulse: 69  Weight: 152 lb (68.9 kg)    Body mass index is 29.69 kg/m.  Generalized: Well developed, in no acute distress  Cardiology: normal rate and rhythm, no murmur noted Respiratory: clear to auscultation bilaterally   Neurological examination  Mentation: Alert oriented to time, place, history taking. Follows all commands speech and language fluent Cranial nerve II-XII: Pupils were equal round reactive to light. Extraocular movements were full, visual field were full on confrontational test. Facial sensation and strength were normal. Uvula tongue midline. Head turning and shoulder shrug  were normal and symmetric. Motor: The motor testing reveals 5 over 5 strength of bilateral extremities, 4/5 bilateral lowers. Good symmetric motor tone is noted throughout.  Sensory: Sensory testing is intact to soft touch on all 4 extremities. No evidence of extinction is noted.  Coordination: Cerebellar testing reveals good finger-nose-finger  Gait and station: Gait is fairly steady with Rolator, able to push with arms to standing position, Tandem not attempted.  Reflexes: Deep tendon reflexes are brisk but symmetric bilaterally.   DIAGNOSTIC DATA (LABS, IMAGING, TESTING) - I reviewed patient records, labs, notes, testing and imaging myself where available.  No flowsheet data found.   Lab Results  Component Value Date   HGB 14.0 07/28/2012      Component Value Date/Time   NA 137 08/28/2012 1249   K 3.6 08/28/2012 1249   CL 102 08/28/2012 1249   CO2  27 08/28/2012 1249   GLUCOSE 115 (H) 08/28/2012 1249   BUN 21 08/28/2012 1249   CREATININE 0.76 08/28/2012 1249   CALCIUM 10.1 08/28/2012 1249   CALCIUM 10.1 08/28/2012 1249   PROT 6.8 08/28/2012 1249   ALBUMIN 4.3 08/28/2012 1249   AST 34 08/28/2012 1249   ALT 35 08/28/2012 1249   ALKPHOS 41 08/28/2012 1249   BILITOT 0.4 08/28/2012 1249   GFRNONAA 82 (L) 07/27/2012 1100   GFRAA >90 07/27/2012 1100   No results found for: CHOL, HDL, LDLCALC, LDLDIRECT, TRIG, CHOLHDL No results found for: HGBA1C No results found for: VITAMINB12 No results found for: TSH     ASSESSMENT AND PLAN 87 y.o. year old female  has a past medical history of Arthritis, Atrophic  vaginitis, Basal cell carcinoma, Cervical dysplasia, Complication of anesthesia, Falls, GERD (gastroesophageal reflux disease), Hypertension, IBS (irritable bowel syndrome), Jaw atrophy, Limb-girdle muscular dystrophy ([place]) (07/05/2013), Macular hole of left eye, Muscular dystrophy ([place]), Osteoporosis, Pseudocholinesterase deficiency, and Scoliosis. here with     ICD-10-CM   1. Limb-girdle muscular dystrophy ([place])  G71.09 Ambulatory referral to Physical Therapy    2. Weakness of both lower extremities  R29.898     3. Macular degeneration, unspecified laterality, unspecified type  H35.30        Tagen is doing fairly well, today. She denies new or worsening symptoms. Gait is stable with Rolator. No falls. We have discussed referral to PT. It has not been particularly helpful in the past, however, I feel this may be beneficial for strength training as well as creating a schedule for her to be more active. She denies significant depression. She does have several family members and friends that continue to care for her. She was encouraged to continue healthy lifestyle habits. Fall and safety precautions discussed. She will follow up regularly with care team as directed. She will follow up with Dr Brett Fairy in 6 months, sooner if needed.   Orders Placed This Encounter  Procedures   Ambulatory referral to Physical Therapy    Referral Priority:   Routine    Referral Type:   Physical Medicine    Referral Reason:   Specialty Services Required    Requested Specialty:   Physical Therapy    Number of Visits Requested:   1      No orders of the defined types were placed in this encounter.      Debbora Presto, FNP-C 06/23/2021, 12:59 PM [facility] [address] [place], [place] [address] [phone]

## 2021-06-23 ENCOUNTER — Encounter: Payer: Self-pay | Admitting: Family Medicine

## 2021-06-23 ENCOUNTER — Ambulatory Visit (INDEPENDENT_AMBULATORY_CARE_PROVIDER_SITE_OTHER): Payer: PPO | Admitting: Family Medicine

## 2021-06-23 VITALS — BP 131/73 | HR 69 | Wt 152.0 lb

## 2021-06-23 DIAGNOSIS — G71039 Limb girdle muscular dystrophy, unspecified: Secondary | ICD-10-CM

## 2021-06-23 DIAGNOSIS — R29898 Other symptoms and signs involving the musculoskeletal system: Secondary | ICD-10-CM | POA: Diagnosis not present

## 2021-06-23 DIAGNOSIS — H3530 Unspecified macular degeneration: Secondary | ICD-10-CM

## 2021-06-23 DIAGNOSIS — G7109 Other specified muscular dystrophies: Secondary | ICD-10-CM | POA: Diagnosis not present

## 2021-06-25 ENCOUNTER — Telehealth: Payer: Self-pay | Admitting: Family Medicine

## 2021-06-25 NOTE — Telephone Encounter (Signed)
Spoke with [place] at [facility]. They were able to accept referral. Patient will be called to start care.

## 2021-06-25 NOTE — Telephone Encounter (Signed)
Home health referral sent to [place] with [facility] for review.

## 2021-06-29 DIAGNOSIS — G629 Polyneuropathy, unspecified: Secondary | ICD-10-CM | POA: Diagnosis not present

## 2021-06-29 DIAGNOSIS — M5432 Sciatica, left side: Secondary | ICD-10-CM | POA: Diagnosis not present

## 2021-06-29 DIAGNOSIS — K219 Gastro-esophageal reflux disease without esophagitis: Secondary | ICD-10-CM | POA: Diagnosis not present

## 2021-06-29 DIAGNOSIS — I10 Essential (primary) hypertension: Secondary | ICD-10-CM | POA: Diagnosis not present

## 2021-06-29 DIAGNOSIS — Z87310 Personal history of (healed) osteoporosis fracture: Secondary | ICD-10-CM | POA: Diagnosis not present

## 2021-06-29 DIAGNOSIS — M810 Age-related osteoporosis without current pathological fracture: Secondary | ICD-10-CM | POA: Diagnosis not present

## 2021-06-29 DIAGNOSIS — Z79899 Other long term (current) drug therapy: Secondary | ICD-10-CM | POA: Diagnosis not present

## 2021-06-29 DIAGNOSIS — M1990 Unspecified osteoarthritis, unspecified site: Secondary | ICD-10-CM | POA: Diagnosis not present

## 2021-06-29 DIAGNOSIS — H3530 Unspecified macular degeneration: Secondary | ICD-10-CM | POA: Diagnosis not present

## 2021-06-29 DIAGNOSIS — H35342 Macular cyst, hole, or pseudohole, left eye: Secondary | ICD-10-CM | POA: Diagnosis not present

## 2021-06-29 DIAGNOSIS — G7109 Other specified muscular dystrophies: Secondary | ICD-10-CM | POA: Diagnosis not present

## 2021-06-29 DIAGNOSIS — N879 Dysplasia of cervix uteri, unspecified: Secondary | ICD-10-CM | POA: Diagnosis not present

## 2021-06-29 DIAGNOSIS — K589 Irritable bowel syndrome without diarrhea: Secondary | ICD-10-CM | POA: Diagnosis not present

## 2021-06-29 DIAGNOSIS — Z85828 Personal history of other malignant neoplasm of skin: Secondary | ICD-10-CM | POA: Diagnosis not present

## 2021-06-29 DIAGNOSIS — M4150 Other secondary scoliosis, site unspecified: Secondary | ICD-10-CM | POA: Diagnosis not present

## 2021-07-03 DIAGNOSIS — H3530 Unspecified macular degeneration: Secondary | ICD-10-CM | POA: Diagnosis not present

## 2021-07-03 DIAGNOSIS — G629 Polyneuropathy, unspecified: Secondary | ICD-10-CM | POA: Diagnosis not present

## 2021-07-03 DIAGNOSIS — M810 Age-related osteoporosis without current pathological fracture: Secondary | ICD-10-CM | POA: Diagnosis not present

## 2021-07-03 DIAGNOSIS — M4150 Other secondary scoliosis, site unspecified: Secondary | ICD-10-CM | POA: Diagnosis not present

## 2021-07-03 DIAGNOSIS — I10 Essential (primary) hypertension: Secondary | ICD-10-CM | POA: Diagnosis not present

## 2021-07-03 DIAGNOSIS — Z85828 Personal history of other malignant neoplasm of skin: Secondary | ICD-10-CM | POA: Diagnosis not present

## 2021-07-03 DIAGNOSIS — N879 Dysplasia of cervix uteri, unspecified: Secondary | ICD-10-CM | POA: Diagnosis not present

## 2021-07-03 DIAGNOSIS — K589 Irritable bowel syndrome without diarrhea: Secondary | ICD-10-CM | POA: Diagnosis not present

## 2021-07-03 DIAGNOSIS — Z79899 Other long term (current) drug therapy: Secondary | ICD-10-CM | POA: Diagnosis not present

## 2021-07-03 DIAGNOSIS — M5432 Sciatica, left side: Secondary | ICD-10-CM | POA: Diagnosis not present

## 2021-07-03 DIAGNOSIS — Z87310 Personal history of (healed) osteoporosis fracture: Secondary | ICD-10-CM | POA: Diagnosis not present

## 2021-07-03 DIAGNOSIS — H35342 Macular cyst, hole, or pseudohole, left eye: Secondary | ICD-10-CM | POA: Diagnosis not present

## 2021-07-03 DIAGNOSIS — K219 Gastro-esophageal reflux disease without esophagitis: Secondary | ICD-10-CM | POA: Diagnosis not present

## 2021-07-03 DIAGNOSIS — M1990 Unspecified osteoarthritis, unspecified site: Secondary | ICD-10-CM | POA: Diagnosis not present

## 2021-07-03 DIAGNOSIS — G7109 Other specified muscular dystrophies: Secondary | ICD-10-CM | POA: Diagnosis not present

## 2021-07-06 DIAGNOSIS — H35363 Drusen (degenerative) of macula, bilateral: Secondary | ICD-10-CM | POA: Diagnosis not present

## 2021-07-06 DIAGNOSIS — Z961 Presence of intraocular lens: Secondary | ICD-10-CM | POA: Diagnosis not present

## 2021-07-06 DIAGNOSIS — H353221 Exudative age-related macular degeneration, left eye, with active choroidal neovascularization: Secondary | ICD-10-CM | POA: Diagnosis not present

## 2021-07-06 DIAGNOSIS — H353112 Nonexudative age-related macular degeneration, right eye, intermediate dry stage: Secondary | ICD-10-CM | POA: Diagnosis not present

## 2021-07-06 DIAGNOSIS — H35453 Secondary pigmentary degeneration, bilateral: Secondary | ICD-10-CM | POA: Diagnosis not present

## 2021-07-06 DIAGNOSIS — D3132 Benign neoplasm of left choroid: Secondary | ICD-10-CM | POA: Diagnosis not present

## 2021-07-07 DIAGNOSIS — I10 Essential (primary) hypertension: Secondary | ICD-10-CM | POA: Diagnosis not present

## 2021-07-07 DIAGNOSIS — M1990 Unspecified osteoarthritis, unspecified site: Secondary | ICD-10-CM | POA: Diagnosis not present

## 2021-07-07 DIAGNOSIS — K589 Irritable bowel syndrome without diarrhea: Secondary | ICD-10-CM | POA: Diagnosis not present

## 2021-07-07 DIAGNOSIS — M810 Age-related osteoporosis without current pathological fracture: Secondary | ICD-10-CM | POA: Diagnosis not present

## 2021-07-07 DIAGNOSIS — N879 Dysplasia of cervix uteri, unspecified: Secondary | ICD-10-CM | POA: Diagnosis not present

## 2021-07-07 DIAGNOSIS — Z87310 Personal history of (healed) osteoporosis fracture: Secondary | ICD-10-CM | POA: Diagnosis not present

## 2021-07-07 DIAGNOSIS — M5432 Sciatica, left side: Secondary | ICD-10-CM | POA: Diagnosis not present

## 2021-07-07 DIAGNOSIS — K219 Gastro-esophageal reflux disease without esophagitis: Secondary | ICD-10-CM | POA: Diagnosis not present

## 2021-07-07 DIAGNOSIS — G7109 Other specified muscular dystrophies: Secondary | ICD-10-CM | POA: Diagnosis not present

## 2021-07-07 DIAGNOSIS — Z79899 Other long term (current) drug therapy: Secondary | ICD-10-CM | POA: Diagnosis not present

## 2021-07-07 DIAGNOSIS — H35342 Macular cyst, hole, or pseudohole, left eye: Secondary | ICD-10-CM | POA: Diagnosis not present

## 2021-07-07 DIAGNOSIS — Z85828 Personal history of other malignant neoplasm of skin: Secondary | ICD-10-CM | POA: Diagnosis not present

## 2021-07-07 DIAGNOSIS — G629 Polyneuropathy, unspecified: Secondary | ICD-10-CM | POA: Diagnosis not present

## 2021-07-07 DIAGNOSIS — M4150 Other secondary scoliosis, site unspecified: Secondary | ICD-10-CM | POA: Diagnosis not present

## 2021-07-07 DIAGNOSIS — H3530 Unspecified macular degeneration: Secondary | ICD-10-CM | POA: Diagnosis not present

## 2021-07-09 DIAGNOSIS — M4150 Other secondary scoliosis, site unspecified: Secondary | ICD-10-CM | POA: Diagnosis not present

## 2021-07-09 DIAGNOSIS — L304 Erythema intertrigo: Secondary | ICD-10-CM | POA: Diagnosis not present

## 2021-07-09 DIAGNOSIS — M1990 Unspecified osteoarthritis, unspecified site: Secondary | ICD-10-CM | POA: Diagnosis not present

## 2021-07-09 DIAGNOSIS — L820 Inflamed seborrheic keratosis: Secondary | ICD-10-CM | POA: Diagnosis not present

## 2021-07-09 DIAGNOSIS — M5432 Sciatica, left side: Secondary | ICD-10-CM | POA: Diagnosis not present

## 2021-07-09 DIAGNOSIS — H3530 Unspecified macular degeneration: Secondary | ICD-10-CM | POA: Diagnosis not present

## 2021-07-09 DIAGNOSIS — D2272 Melanocytic nevi of left lower limb, including hip: Secondary | ICD-10-CM | POA: Diagnosis not present

## 2021-07-09 DIAGNOSIS — D2371 Other benign neoplasm of skin of right lower limb, including hip: Secondary | ICD-10-CM | POA: Diagnosis not present

## 2021-07-09 DIAGNOSIS — L814 Other melanin hyperpigmentation: Secondary | ICD-10-CM | POA: Diagnosis not present

## 2021-07-09 DIAGNOSIS — D2261 Melanocytic nevi of right upper limb, including shoulder: Secondary | ICD-10-CM | POA: Diagnosis not present

## 2021-07-09 DIAGNOSIS — Z79899 Other long term (current) drug therapy: Secondary | ICD-10-CM | POA: Diagnosis not present

## 2021-07-09 DIAGNOSIS — Z87310 Personal history of (healed) osteoporosis fracture: Secondary | ICD-10-CM | POA: Diagnosis not present

## 2021-07-09 DIAGNOSIS — G629 Polyneuropathy, unspecified: Secondary | ICD-10-CM | POA: Diagnosis not present

## 2021-07-09 DIAGNOSIS — D225 Melanocytic nevi of trunk: Secondary | ICD-10-CM | POA: Diagnosis not present

## 2021-07-09 DIAGNOSIS — M810 Age-related osteoporosis without current pathological fracture: Secondary | ICD-10-CM | POA: Diagnosis not present

## 2021-07-09 DIAGNOSIS — N879 Dysplasia of cervix uteri, unspecified: Secondary | ICD-10-CM | POA: Diagnosis not present

## 2021-07-09 DIAGNOSIS — L218 Other seborrheic dermatitis: Secondary | ICD-10-CM | POA: Diagnosis not present

## 2021-07-09 DIAGNOSIS — K219 Gastro-esophageal reflux disease without esophagitis: Secondary | ICD-10-CM | POA: Diagnosis not present

## 2021-07-09 DIAGNOSIS — D0461 Carcinoma in situ of skin of right upper limb, including shoulder: Secondary | ICD-10-CM | POA: Diagnosis not present

## 2021-07-09 DIAGNOSIS — G7109 Other specified muscular dystrophies: Secondary | ICD-10-CM | POA: Diagnosis not present

## 2021-07-09 DIAGNOSIS — H35342 Macular cyst, hole, or pseudohole, left eye: Secondary | ICD-10-CM | POA: Diagnosis not present

## 2021-07-09 DIAGNOSIS — K589 Irritable bowel syndrome without diarrhea: Secondary | ICD-10-CM | POA: Diagnosis not present

## 2021-07-09 DIAGNOSIS — I10 Essential (primary) hypertension: Secondary | ICD-10-CM | POA: Diagnosis not present

## 2021-07-09 DIAGNOSIS — Z85828 Personal history of other malignant neoplasm of skin: Secondary | ICD-10-CM | POA: Diagnosis not present

## 2021-07-09 DIAGNOSIS — D2271 Melanocytic nevi of right lower limb, including hip: Secondary | ICD-10-CM | POA: Diagnosis not present

## 2021-07-09 DIAGNOSIS — L570 Actinic keratosis: Secondary | ICD-10-CM | POA: Diagnosis not present

## 2021-07-14 DIAGNOSIS — N879 Dysplasia of cervix uteri, unspecified: Secondary | ICD-10-CM | POA: Diagnosis not present

## 2021-07-14 DIAGNOSIS — Z79899 Other long term (current) drug therapy: Secondary | ICD-10-CM | POA: Diagnosis not present

## 2021-07-14 DIAGNOSIS — I10 Essential (primary) hypertension: Secondary | ICD-10-CM | POA: Diagnosis not present

## 2021-07-14 DIAGNOSIS — G629 Polyneuropathy, unspecified: Secondary | ICD-10-CM | POA: Diagnosis not present

## 2021-07-14 DIAGNOSIS — M4150 Other secondary scoliosis, site unspecified: Secondary | ICD-10-CM | POA: Diagnosis not present

## 2021-07-14 DIAGNOSIS — K219 Gastro-esophageal reflux disease without esophagitis: Secondary | ICD-10-CM | POA: Diagnosis not present

## 2021-07-14 DIAGNOSIS — Z85828 Personal history of other malignant neoplasm of skin: Secondary | ICD-10-CM | POA: Diagnosis not present

## 2021-07-14 DIAGNOSIS — H35342 Macular cyst, hole, or pseudohole, left eye: Secondary | ICD-10-CM | POA: Diagnosis not present

## 2021-07-14 DIAGNOSIS — K589 Irritable bowel syndrome without diarrhea: Secondary | ICD-10-CM | POA: Diagnosis not present

## 2021-07-14 DIAGNOSIS — H3530 Unspecified macular degeneration: Secondary | ICD-10-CM | POA: Diagnosis not present

## 2021-07-14 DIAGNOSIS — G7109 Other specified muscular dystrophies: Secondary | ICD-10-CM | POA: Diagnosis not present

## 2021-07-14 DIAGNOSIS — M810 Age-related osteoporosis without current pathological fracture: Secondary | ICD-10-CM | POA: Diagnosis not present

## 2021-07-14 DIAGNOSIS — Z1231 Encounter for screening mammogram for malignant neoplasm of breast: Secondary | ICD-10-CM | POA: Diagnosis not present

## 2021-07-14 DIAGNOSIS — M1990 Unspecified osteoarthritis, unspecified site: Secondary | ICD-10-CM | POA: Diagnosis not present

## 2021-07-14 DIAGNOSIS — M5432 Sciatica, left side: Secondary | ICD-10-CM | POA: Diagnosis not present

## 2021-07-14 DIAGNOSIS — Z87310 Personal history of (healed) osteoporosis fracture: Secondary | ICD-10-CM | POA: Diagnosis not present

## 2021-07-15 DIAGNOSIS — H353221 Exudative age-related macular degeneration, left eye, with active choroidal neovascularization: Secondary | ICD-10-CM | POA: Diagnosis not present

## 2021-07-16 DIAGNOSIS — G629 Polyneuropathy, unspecified: Secondary | ICD-10-CM | POA: Diagnosis not present

## 2021-07-16 DIAGNOSIS — H3530 Unspecified macular degeneration: Secondary | ICD-10-CM | POA: Diagnosis not present

## 2021-07-16 DIAGNOSIS — M5432 Sciatica, left side: Secondary | ICD-10-CM | POA: Diagnosis not present

## 2021-07-16 DIAGNOSIS — N879 Dysplasia of cervix uteri, unspecified: Secondary | ICD-10-CM | POA: Diagnosis not present

## 2021-07-16 DIAGNOSIS — Z87310 Personal history of (healed) osteoporosis fracture: Secondary | ICD-10-CM | POA: Diagnosis not present

## 2021-07-16 DIAGNOSIS — K589 Irritable bowel syndrome without diarrhea: Secondary | ICD-10-CM | POA: Diagnosis not present

## 2021-07-16 DIAGNOSIS — K219 Gastro-esophageal reflux disease without esophagitis: Secondary | ICD-10-CM | POA: Diagnosis not present

## 2021-07-16 DIAGNOSIS — M810 Age-related osteoporosis without current pathological fracture: Secondary | ICD-10-CM | POA: Diagnosis not present

## 2021-07-16 DIAGNOSIS — I10 Essential (primary) hypertension: Secondary | ICD-10-CM | POA: Diagnosis not present

## 2021-07-16 DIAGNOSIS — H35342 Macular cyst, hole, or pseudohole, left eye: Secondary | ICD-10-CM | POA: Diagnosis not present

## 2021-07-16 DIAGNOSIS — G7109 Other specified muscular dystrophies: Secondary | ICD-10-CM | POA: Diagnosis not present

## 2021-07-16 DIAGNOSIS — M1990 Unspecified osteoarthritis, unspecified site: Secondary | ICD-10-CM | POA: Diagnosis not present

## 2021-07-16 DIAGNOSIS — Z85828 Personal history of other malignant neoplasm of skin: Secondary | ICD-10-CM | POA: Diagnosis not present

## 2021-07-16 DIAGNOSIS — Z79899 Other long term (current) drug therapy: Secondary | ICD-10-CM | POA: Diagnosis not present

## 2021-07-16 DIAGNOSIS — M4150 Other secondary scoliosis, site unspecified: Secondary | ICD-10-CM | POA: Diagnosis not present

## 2021-07-20 DIAGNOSIS — I129 Hypertensive chronic kidney disease with stage 1 through stage 4 chronic kidney disease, or unspecified chronic kidney disease: Secondary | ICD-10-CM | POA: Diagnosis not present

## 2021-07-20 DIAGNOSIS — I4510 Unspecified right bundle-branch block: Secondary | ICD-10-CM | POA: Diagnosis not present

## 2021-07-20 DIAGNOSIS — E785 Hyperlipidemia, unspecified: Secondary | ICD-10-CM | POA: Diagnosis not present

## 2021-07-20 DIAGNOSIS — N1831 Chronic kidney disease, stage 3a: Secondary | ICD-10-CM | POA: Diagnosis not present

## 2021-07-20 DIAGNOSIS — I499 Cardiac arrhythmia, unspecified: Secondary | ICD-10-CM | POA: Diagnosis not present

## 2021-07-23 DIAGNOSIS — I10 Essential (primary) hypertension: Secondary | ICD-10-CM | POA: Diagnosis not present

## 2021-07-23 DIAGNOSIS — G7109 Other specified muscular dystrophies: Secondary | ICD-10-CM | POA: Diagnosis not present

## 2021-07-23 DIAGNOSIS — Z87310 Personal history of (healed) osteoporosis fracture: Secondary | ICD-10-CM | POA: Diagnosis not present

## 2021-07-23 DIAGNOSIS — K219 Gastro-esophageal reflux disease without esophagitis: Secondary | ICD-10-CM | POA: Diagnosis not present

## 2021-07-23 DIAGNOSIS — M5432 Sciatica, left side: Secondary | ICD-10-CM | POA: Diagnosis not present

## 2021-07-23 DIAGNOSIS — H35342 Macular cyst, hole, or pseudohole, left eye: Secondary | ICD-10-CM | POA: Diagnosis not present

## 2021-07-23 DIAGNOSIS — N879 Dysplasia of cervix uteri, unspecified: Secondary | ICD-10-CM | POA: Diagnosis not present

## 2021-07-23 DIAGNOSIS — Z85828 Personal history of other malignant neoplasm of skin: Secondary | ICD-10-CM | POA: Diagnosis not present

## 2021-07-23 DIAGNOSIS — Z79899 Other long term (current) drug therapy: Secondary | ICD-10-CM | POA: Diagnosis not present

## 2021-07-23 DIAGNOSIS — M810 Age-related osteoporosis without current pathological fracture: Secondary | ICD-10-CM | POA: Diagnosis not present

## 2021-07-23 DIAGNOSIS — K589 Irritable bowel syndrome without diarrhea: Secondary | ICD-10-CM | POA: Diagnosis not present

## 2021-07-23 DIAGNOSIS — G629 Polyneuropathy, unspecified: Secondary | ICD-10-CM | POA: Diagnosis not present

## 2021-07-23 DIAGNOSIS — M4150 Other secondary scoliosis, site unspecified: Secondary | ICD-10-CM | POA: Diagnosis not present

## 2021-07-23 DIAGNOSIS — M1990 Unspecified osteoarthritis, unspecified site: Secondary | ICD-10-CM | POA: Diagnosis not present

## 2021-07-23 DIAGNOSIS — H3530 Unspecified macular degeneration: Secondary | ICD-10-CM | POA: Diagnosis not present

## 2021-07-27 ENCOUNTER — Other Ambulatory Visit: Payer: Self-pay

## 2021-07-27 ENCOUNTER — Ambulatory Visit: Payer: PPO | Admitting: Student

## 2021-07-27 ENCOUNTER — Encounter: Payer: Self-pay | Admitting: Student

## 2021-07-27 VITALS — BP 136/68 | HR 75 | Temp 97.4°F | Ht 60.0 in | Wt 154.0 lb

## 2021-07-27 DIAGNOSIS — R0609 Other forms of dyspnea: Secondary | ICD-10-CM

## 2021-07-27 DIAGNOSIS — I499 Cardiac arrhythmia, unspecified: Secondary | ICD-10-CM

## 2021-07-27 DIAGNOSIS — R002 Palpitations: Secondary | ICD-10-CM | POA: Diagnosis not present

## 2021-07-27 DIAGNOSIS — I491 Atrial premature depolarization: Secondary | ICD-10-CM

## 2021-07-27 NOTE — Progress Notes (Signed)
Primary Physician/Referring:  Reynold Bowen, MD  Patient ID: Catherine Harding, female    DOB: Aug 09, 1930, 87 y.o.   MRN: [id number]  Chief Complaint  Patient presents with   Irregular Heart Beat   New Patient (Initial Visit)   HPI:    Catherine Harding  is a 87 y.o. Caucasian female with history of hypertension, hyperlipidemia, muscular dystrophy (follows with [facility]), IBS-D.  Denies history of tobacco or alcohol use, diabetes, family premature CAD.   Patient is referred to our office for evaluation of irregular heart rhythm by PCP.  Patient notes that she has recently been doing home physical therapy, and physical therapist noted "skipped heartbeat" which prompted her to be evaluated further by PCP and referred to our office.  Patient's primary concern is dyspnea on exertion over the last several weeks, which she notices most during physical therapy.  Notably patient is relatively inactive given muscular dystrophy, therefore she is without a formal exercise routine for the last several years.  Denies chest pain, dizziness, palpitations, syncope, near syncope.  Denies orthopnea, PND.  Patient is accompanied by her daughter at bedside for today's office visit. Given her underlying medical conditions patient is doing overall quite well for her age in particular.   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   Macular hole of left eye    Muscular dystrophy ([place])    Osteoporosis    pelvic fracture   Pseudocholinesterase deficiency    Scoliosis    Past Surgical History:  Procedure Laterality Date   ABDOMINAL HYSTERECTOMY     TAH BSO   BREAST  LUMPECTOMY  1971   benign   CESAREAN SECTION     x2   COLPOSCOPY     EYE SURGERY  2003   left   TOTAL ABDOMINAL HYSTERECTOMY W/ BILATERAL SALPINGOOPHORECTOMY  1981   TRIGGER FINGER RELEASE  07/28/2012   Procedure: RELEASE TRIGGER FINGER/A-1 PULLEY;  Surgeon: Cammie Sickle., MD;  Location: [place];  Service: Orthopedics;  Laterality: Right;  EXCISION CYST RIGHT LONG A-1 RELEASE A-1 RIGHT LONG    Family History  Problem Relation Age of Onset   Hypertension Sister     Social History   Tobacco Use   Smoking status: Never   Smokeless tobacco: Never  Substance Use Topics   Alcohol use: No    Alcohol/week: 0.0 standard drinks    Comment: rare   Marital Status: Divorced   ROS  Review of Systems  Constitutional: Negative for malaise/fatigue and weight gain.  Cardiovascular:  Positive for dyspnea on exertion (mild). Negative for chest pain, claudication, leg swelling, near-syncope, orthopnea, palpitations, paroxysmal nocturnal dyspnea and syncope.  Neurological:  Negative for dizziness.   Objective  Blood pressure 136/68, pulse 75, temperature (!) 97.4 F (36.3 C), temperature source Temporal, height 5' (1.524 m), weight 154 lb (69.9 kg), SpO2 96 %.  Vitals with BMI 07/27/2021 06/23/2021 03/16/2021  Height 5\' 0"  - -  Weight 154 lbs 152 lbs -  BMI 53.61 - -  Systolic 443 154 008  Diastolic 68 73 64  Pulse 75  69 67      Physical Exam Vitals reviewed.  Constitutional:      Appearance: She is obese.  HENT:     Head: Normocephalic and atraumatic.  Cardiovascular:     Rate and Rhythm: Normal rate and regular rhythm. FrequentExtrasystoles are present.    Pulses: Intact distal pulses.     Heart sounds: S1 normal and S2 normal. No murmur heard.   No gallop.  Pulmonary:     Effort: Pulmonary effort is normal. No respiratory distress.     Breath sounds: No wheezing, rhonchi or rales.  Musculoskeletal:     Right lower leg: No edema.     Left lower leg: No  edema.  Neurological:     Mental Status: She is alert.    Laboratory examination:   No results for input(s): NA, K, CL, CO2, GLUCOSE, BUN, CREATININE, CALCIUM, GFRNONAA, GFRAA in the last 8760 hours. CrCl cannot be calculated (Patient's most recent lab result is older than the maximum 21 days allowed.).  CMP Latest Ref Rng & Units 08/28/2012 08/28/2012 07/27/2012  Glucose 70 - 99 mg/dL 115(H) - 116(H)  BUN 6 - 23 mg/dL 21 - 14  Creatinine 0.50 - 1.10 mg/dL 0.76 - 0.65  Sodium 135 - 145 mEq/L 137 - 141  Potassium 3.5 - 5.3 mEq/L 3.6 - 4.1  Chloride 96 - 112 mEq/L 102 - 107  CO2 19 - 32 mEq/L 27 - 26  Calcium 8.4 - 10.5 mg/dL 10.1 10.1 9.6  Total Protein 6.0 - 8.3 g/dL 6.8 - -  Total Bilirubin 0.3 - 1.2 mg/dL 0.4 - -  Alkaline Phos 39 - 117 U/L 41 - -  AST 0 - 37 U/L 34 - -  ALT 0 - 35 U/L 35 - -   CBC Latest Ref Rng & Units 07/28/2012  Hemoglobin 12.0 - 15.0 g/dL 14.0    Lipid Panel No results for input(s): CHOL, TRIG, LDLCALC, VLDL, HDL, CHOLHDL, LDLDIRECT in the last 8760 hours.  HEMOGLOBIN A1C No results found for: HGBA1C, MPG TSH No results for input(s): TSH in the last 8760 hours.  External labs:   06/10/2021: Hgb 13.7, HCT 37.0, platelet 172 BUN 15, creatinine 0.8, GFR >60, sodium 142, potassium 3.8, AST 35, ALT 37  12/01/2020: Triglycerides 234, HDL 37, LDL 107, total cholesterol 191 TSH 1.83 A1c 6.8%  Allergies   Allergies  Allergen Reactions   Anesthetics, Amide    Celebrex [Celecoxib]    Ciprofloxacin Swelling   Codeine Nausea And Vomiting   Morphine And Related Nausea And Vomiting   Sulfa Antibiotics     Medications Prior to Visit:   Outpatient Medications Prior to Visit  Medication Sig Dispense Refill   Artificial Tear Solution (SYSTANE CONTACTS) SOLN Apply to eye.     bacitracin ophthalmic ointment Place 510 application into both eyes 3 (three) times daily.     Calcium Carbonate Antacid (TUMS PO) Take by mouth daily as needed. chewable      Calcium Carbonate-Vit D-Min (CALTRATE PLUS PO) Take by mouth.       denosumab (PROLIA) 60 MG/ML SOLN injection Inject 60 mg into the skin every 6 (six) months. Administer in upper arm, thigh, or abdomen     diltiazem (CARDIZEM CD) 240 MG 24 hr capsule Take 240 mg by mouth daily.       ergocalciferol (VITAMIN D2) 50000 UNITS capsule Take 50,000 Units by mouth 2 (two) times a week.      famotidine (PEPCID) 20 MG tablet Take  20 mg by mouth 2 (two) times daily.     fluocinonide (LIDEX) 0.05 % external solution Apply topically.     fluticasone (FLONASE) 50 MCG/ACT nasal spray      furosemide (LASIX) 20 MG tablet Take 20 mg by mouth 3 (three) times daily. Patient states she takes 2 tablets in the morning and 1 tablet at 2pm each day.     hyoscyamine (LEVSIN SL) 0.125 MG SL tablet      ketoconazole (NIZORAL) 2 % cream SMARTSIG:1 Topical Daily PRN     lansoprazole (PREVACID) 30 MG capsule Take 30 mg by mouth 2 (two) times daily before a meal.      losartan-hydrochlorothiazide (HYZAAR) 100-12.5 MG per tablet Take 12.5 tablets by mouth daily.     montelukast (SINGULAIR) 10 MG tablet Take 10 mg by mouth daily.     Multiple Vitamins-Minerals (PRESERVISION/LUTEIN) CAPS Take 2 capsules by mouth daily.     Omega-3 Fatty Acids (FISH OIL) 1200 MG CAPS Take 2 capsules by mouth daily.     Polyethyl Glycol-Propyl Glycol 0.4-0.3 % SOLN Apply to eye.       potassium chloride (KLOR-CON) 10 MEQ tablet Take 10 mEq by mouth daily.     sodium chloride (OCEAN) 0.65 % nasal spray Place 1 spray into the nose as needed for congestion.     triamcinolone cream (KENALOG) 0.1 % SMARTSIG:1 Application Topical 2-3 Times Daily     FLUTICASONE PROPIONATE, NASAL, NA Place into the nose. 2 sprays 1/daily     Probiotic Product (ALIGN PO) Take by mouth.       doxycycline (VIBRA-TABS) 100 MG tablet Take 100 mg by mouth 2 (two) times daily.     loperamide (IMODIUM) 2 MG capsule Take 2 mg by mouth as needed for diarrhea or loose stools.      No facility-administered medications prior to visit.   Final Medications at End of Visit    Current Meds  Medication Sig   Artificial Tear Solution (SYSTANE CONTACTS) SOLN Apply to eye.   bacitracin ophthalmic ointment Place 409 application into both eyes 3 (three) times daily.   Calcium Carbonate Antacid (TUMS PO) Take by mouth daily as needed. chewable   Calcium Carbonate-Vit D-Min (CALTRATE PLUS PO) Take by mouth.     denosumab (PROLIA) 60 MG/ML SOLN injection Inject 60 mg into the skin every 6 (six) months. Administer in upper arm, thigh, or abdomen   diltiazem (CARDIZEM CD) 240 MG 24 hr capsule Take 240 mg by mouth daily.     ergocalciferol (VITAMIN D2) 50000 UNITS capsule Take 50,000 Units by mouth 2 (two) times a week.    famotidine (PEPCID) 20 MG tablet Take 20 mg by mouth 2 (two) times daily.   fluocinonide (LIDEX) 0.05 % external solution Apply topically.   fluticasone (FLONASE) 50 MCG/ACT nasal spray    furosemide (LASIX) 20 MG tablet Take 20 mg by mouth 3 (three) times daily. Patient states she takes 2 tablets in the morning and 1 tablet at 2pm each day.   hyoscyamine (LEVSIN SL) 0.125 MG SL tablet    ketoconazole (NIZORAL) 2 % cream SMARTSIG:1 Topical Daily PRN   lansoprazole (PREVACID) 30 MG capsule Take 30 mg by mouth 2 (two) times daily before a meal.    losartan-hydrochlorothiazide (HYZAAR) 100-12.5 MG per tablet Take 12.5 tablets by mouth daily.   montelukast (SINGULAIR) 10 MG tablet Take 10 mg by mouth daily.   Multiple Vitamins-Minerals (PRESERVISION/LUTEIN) CAPS Take 2 capsules by mouth daily.  Omega-3 Fatty Acids (FISH OIL) 1200 MG CAPS Take 2 capsules by mouth daily.   Polyethyl Glycol-Propyl Glycol 0.4-0.3 % SOLN Apply to eye.     potassium chloride (KLOR-CON) 10 MEQ tablet Take 10 mEq by mouth daily.   sodium chloride (OCEAN) 0.65 % nasal spray Place 1 spray into the nose as needed for congestion.   triamcinolone cream (KENALOG) 0.1 % SMARTSIG:1 Application  Topical 2-3 Times Daily   [DISCONTINUED] FLUTICASONE PROPIONATE, NASAL, NA Place into the nose. 2 sprays 1/daily   [DISCONTINUED] Probiotic Product (ALIGN PO) Take by mouth.     Radiology:   No results found.  Cardiac Studies:   None   EKG:   07/27/2021: Sinus rhythm with frequent PACs at a rate of 72 bpm.  Normal axis.  Right bundle branch block with secondary ST-T wave changes, cannot exclude ischemia.  Assessment     ICD-10-CM   1. Dyspnea on exertion  R06.09 PCV MYOCARDIAL PERFUSION WITH LEXISCAN    PCV ECHOCARDIOGRAM COMPLETE    2. PAC (premature atrial contraction)  I49.1     3. Palpitations  R00.2 LONG TERM MONITOR (3-14 DAYS)    4. Irregular heartbeat  I49.9 EKG 12-Lead       Medications Discontinued During This Encounter  Medication Reason   FLUTICASONE PROPIONATE, NASAL, NA Error   loperamide (IMODIUM) 2 MG capsule Error   doxycycline (VIBRA-TABS) 100 MG tablet Error   Probiotic Product (ALIGN PO) Error    No orders of the defined types were placed in this encounter.   Recommendations:   Catherine Harding is a 87 y.o. Caucasian female with history of hypertension, hyperlipidemia, muscular dystrophy (follows with [facility]), IBS-D.  Denies history of tobacco or alcohol use, diabetes, family premature CAD.   Patient is referred to our office for evaluation of irregular heart rhythm by PCP.  Patient's EKG today reveals frequent PACs and right bundle branch block.  Patient is relatively asymptomatic, however she does report dyspnea on exertion worsening over the last few weeks.  Given dyspnea as well as multiple cardiovascular risk factors including advanced age, hypertension, hyperlipidemia, will proceed with further ischemic evaluation.  Due to muscular dystrophy patient is not a treadmill candidate, will obtain Lexiscan nuclear stress test.  Given frequent PACs and dyspnea we will also obtain echocardiogram.  We will do cardiac monitor to  assess PAC burden.  Patient's blood pressure is well controlled.  I personally reviewed external labs, lipids are uncontrolled.  Hypercholesterolemia is presently managed by PCP, will defer further management to Dr. Forde Dandy.  Further recommendations pending cardiovascular testing.  During this visit I reviewed and updated: Tobacco history  allergies medication reconciliation  medical history  surgical history  family history  social history.  This note was created using a voice recognition software as a result there may be grammatical errors inadvertently enclosed that do not reflect the nature of this encounter. Every attempt is made to correct such errors.   Alethia Berthold, PA-C 07/27/2021, 12:22 PM Office: [phone]

## 2021-08-04 ENCOUNTER — Other Ambulatory Visit: Payer: Self-pay

## 2021-08-04 ENCOUNTER — Ambulatory Visit: Payer: PPO

## 2021-08-04 DIAGNOSIS — R0609 Other forms of dyspnea: Secondary | ICD-10-CM

## 2021-08-05 ENCOUNTER — Inpatient Hospital Stay: Payer: PPO

## 2021-08-05 ENCOUNTER — Ambulatory Visit: Payer: PPO

## 2021-08-05 DIAGNOSIS — R002 Palpitations: Secondary | ICD-10-CM | POA: Diagnosis not present

## 2021-08-05 DIAGNOSIS — R0609 Other forms of dyspnea: Secondary | ICD-10-CM | POA: Diagnosis not present

## 2021-08-06 LAB — PCV MYOCARDIAL PERFUSION WITH LEXISCAN: ST Depression (mm): 0 mm

## 2021-08-08 NOTE — Progress Notes (Signed)
Low risk stress test. Will discuss further at upcoming office visit.

## 2021-08-10 NOTE — Progress Notes (Signed)
Normal pumping activity, monthly dilated left atrium, and moderate LVH.  We will discuss further at upcoming office visit.

## 2021-08-11 NOTE — Progress Notes (Signed)
Called pt to inform her about her stress test result. Pt understood

## 2021-08-11 NOTE — Progress Notes (Signed)
Called pt to inform er about her echo result. Pt understood

## 2021-08-21 DIAGNOSIS — R002 Palpitations: Secondary | ICD-10-CM | POA: Diagnosis not present

## 2021-08-25 DIAGNOSIS — R002 Palpitations: Secondary | ICD-10-CM | POA: Diagnosis not present

## 2021-08-25 NOTE — Progress Notes (Signed)
Called pt to inform her about her monitor. Pt understood

## 2021-08-25 NOTE — Progress Notes (Signed)
Relatively frequent extra heartbeats which may be the cause of patient's palpitations.  We will discuss further at upcoming office visit.

## 2021-09-07 ENCOUNTER — Ambulatory Visit: Payer: PPO | Admitting: Student

## 2021-09-07 DIAGNOSIS — H353221 Exudative age-related macular degeneration, left eye, with active choroidal neovascularization: Secondary | ICD-10-CM | POA: Diagnosis not present

## 2021-09-10 ENCOUNTER — Ambulatory Visit: Payer: PPO | Admitting: Student

## 2021-09-10 ENCOUNTER — Other Ambulatory Visit: Payer: Self-pay

## 2021-09-10 ENCOUNTER — Encounter (HOSPITAL_COMMUNITY): Payer: PPO

## 2021-09-10 ENCOUNTER — Encounter: Payer: Self-pay | Admitting: Student

## 2021-09-10 VITALS — BP 147/53 | Temp 98.2°F | Ht 60.0 in | Wt 154.0 lb

## 2021-09-10 DIAGNOSIS — I491 Atrial premature depolarization: Secondary | ICD-10-CM | POA: Diagnosis not present

## 2021-09-10 DIAGNOSIS — R002 Palpitations: Secondary | ICD-10-CM | POA: Diagnosis not present

## 2021-09-10 NOTE — Progress Notes (Signed)
Primary Physician/Referring:  Reynold Bowen, MD  Patient ID: Catherine Harding, female    DOB: 03/02/1930, 87 y.o.   MRN: [id number]  Chief Complaint  Patient presents with   Shortness of Breath   Follow-up   Results   HPI:    Catherine Harding  is a 87 y.o. Caucasian female with history of hypertension, hyperlipidemia, muscular dystrophy (follows with [facility]), IBS-D.  Denies history of tobacco or alcohol use, diabetes, family premature CAD.   Patient was referred to our office by PCP for evaluation of irregular heart rhythm.  Patient's EKG at last office visit noted frequent PACs and right bundle branch block, therefore recommended stress test, cardiac monitor, and echocardiogram.  Patient now presents for results with her son and daughter-in-law present at bedside.  Stress test was overall low risk and echocardiogram noted preserved LVEF with grade 1 diastolic dysfunction and moderate LVH.  Cardiac monitor revealed PVC burden of 15%, however patient is completely asymptomatic.  Patient denies palpitations, dyspnea, dizziness, syncope, near syncope.  Denies orthopnea, PND, chest pain.  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   Macular hole of left eye    Muscular dystrophy ([place])    Osteoporosis    pelvic fracture   Pseudocholinesterase deficiency    Scoliosis    Past Surgical History:  Procedure Laterality Date   ABDOMINAL HYSTERECTOMY     TAH BSO   BREAST LUMPECTOMY  1971   benign   CESAREAN SECTION     x2   COLPOSCOPY     EYE SURGERY  2003   left   TOTAL ABDOMINAL HYSTERECTOMY W/ BILATERAL SALPINGOOPHORECTOMY  1981    TRIGGER FINGER RELEASE  07/28/2012   Procedure: RELEASE TRIGGER FINGER/A-1 PULLEY;  Surgeon: Cammie Sickle., MD;  Location: [place];  Service: Orthopedics;  Laterality: Right;  EXCISION CYST RIGHT LONG A-1 RELEASE A-1 RIGHT LONG    Family History  Problem Relation Age of Onset   Hypertension Sister     Social History   Tobacco Use   Smoking status: Never   Smokeless tobacco: Never  Substance Use Topics   Alcohol use: No    Alcohol/week: 0.0 standard drinks    Comment: rare   Marital Status: Divorced   ROS  Review of Systems  Constitutional: Negative for malaise/fatigue and weight gain.  Cardiovascular:  Positive for dyspnea on exertion (mild). Negative for chest pain, claudication, leg swelling, near-syncope, orthopnea, palpitations, paroxysmal nocturnal dyspnea and syncope.  Neurological:  Negative for dizziness.   Objective  Blood pressure (!) 147/53, temperature 98.2 F (36.8 C), temperature source Temporal, height 5' (1.524 m), weight 154 lb (69.9 kg), SpO2 97 %.  Vitals with BMI 09/10/2021 07/27/2021 06/23/2021  Height 5\' 0"  5\' 0"  -  Weight 154 lbs 154 lbs 152 lbs  BMI 40.98 11.91 -  Systolic 478 295 621  Diastolic 53 68 73  Pulse - 75 69      Physical Exam Vitals reviewed.  Constitutional:      Appearance: She is obese.  HENT:     Head: Normocephalic and atraumatic.  Cardiovascular:     Rate and Rhythm: Normal rate and regular rhythm. FrequentExtrasystoles are present.    Pulses: Intact distal pulses.     Heart sounds: S1 normal and S2 normal. No murmur heard.   No gallop.  Pulmonary:     Effort: Pulmonary effort is normal. No respiratory distress.     Breath sounds: No wheezing, rhonchi or rales.  Musculoskeletal:     Right lower leg: No edema.     Left lower leg: No edema.  Neurological:     Mental Status: She is alert.  Physical exam unchanged compared to previous office visit.  Laboratory examination:   No results for  input(s): NA, K, CL, CO2, GLUCOSE, BUN, CREATININE, CALCIUM, GFRNONAA, GFRAA in the last 8760 hours. CrCl cannot be calculated (Patient's most recent lab result is older than the maximum 21 days allowed.).  CMP Latest Ref Rng & Units 08/28/2012 08/28/2012 07/27/2012  Glucose 70 - 99 mg/dL 115(H) - 116(H)  BUN 6 - 23 mg/dL 21 - 14  Creatinine 0.50 - 1.10 mg/dL 0.76 - 0.65  Sodium 135 - 145 mEq/L 137 - 141  Potassium 3.5 - 5.3 mEq/L 3.6 - 4.1  Chloride 96 - 112 mEq/L 102 - 107  CO2 19 - 32 mEq/L 27 - 26  Calcium 8.4 - 10.5 mg/dL 10.1 10.1 9.6  Total Protein 6.0 - 8.3 g/dL 6.8 - -  Total Bilirubin 0.3 - 1.2 mg/dL 0.4 - -  Alkaline Phos 39 - 117 U/L 41 - -  AST 0 - 37 U/L 34 - -  ALT 0 - 35 U/L 35 - -   CBC Latest Ref Rng & Units 07/28/2012  Hemoglobin 12.0 - 15.0 g/dL 14.0    Lipid Panel No results for input(s): CHOL, TRIG, LDLCALC, VLDL, HDL, CHOLHDL, LDLDIRECT in the last 8760 hours.  HEMOGLOBIN A1C No results found for: HGBA1C, MPG TSH No results for input(s): TSH in the last 8760 hours.  External labs:   06/10/2021: Hgb 13.7, HCT 37.0, platelet 172 BUN 15, creatinine 0.8, GFR >60, sodium 142, potassium 3.8, AST 35, ALT 37  12/01/2020: Triglycerides 234, HDL 37, LDL 107, total cholesterol 191 TSH 1.83 A1c 6.8%  Allergies   Allergies  Allergen Reactions   Anesthetics, Amide    Celebrex [Celecoxib]    Ciprofloxacin Swelling   Codeine Nausea And Vomiting   Morphine And Related Nausea And Vomiting   Sulfa Antibiotics     Medications Prior to Visit:   Outpatient Medications Prior to Visit  Medication Sig Dispense Refill   Artificial Tear Solution (SYSTANE CONTACTS) SOLN Apply to eye.     bacitracin ophthalmic ointment Place 284 application into both eyes 3 (three) times daily.     Calcium Carbonate Antacid (TUMS PO) Take by mouth daily as needed. chewable     Calcium Carbonate-Vit D-Min (CALTRATE PLUS PO) Take by mouth.       denosumab (PROLIA) 60 MG/ML SOLN  injection Inject 60 mg into the skin every 6 (six) months. Administer in upper arm, thigh, or abdomen     diltiazem (CARDIZEM CD) 240 MG 24 hr capsule Take 240 mg by mouth daily.       ergocalciferol (VITAMIN D2) 50000 UNITS capsule Take 50,000 Units by mouth 2 (two) times a week.      famotidine (PEPCID) 20 MG tablet Take 20 mg by mouth 2 (two) times daily.     fluocinonide (LIDEX) 0.05 % external solution Apply topically.     fluticasone (FLONASE)  50 MCG/ACT nasal spray      furosemide (LASIX) 20 MG tablet Take 20 mg by mouth 3 (three) times daily. Patient states she takes 2 tablets in the morning and 1 tablet at 2pm each day.     hyoscyamine (LEVSIN SL) 0.125 MG SL tablet      ketoconazole (NIZORAL) 2 % cream SMARTSIG:1 Topical Daily PRN     lansoprazole (PREVACID) 30 MG capsule Take 30 mg by mouth 2 (two) times daily before a meal.      losartan-hydrochlorothiazide (HYZAAR) 100-12.5 MG per tablet Take 12.5 tablets by mouth daily.     montelukast (SINGULAIR) 10 MG tablet Take 10 mg by mouth daily.     Multiple Vitamins-Minerals (PRESERVISION/LUTEIN) CAPS Take 2 capsules by mouth daily.     Omega-3 Fatty Acids (FISH OIL) 1200 MG CAPS Take 2 capsules by mouth daily.     Polyethyl Glycol-Propyl Glycol 0.4-0.3 % SOLN Apply to eye.       potassium chloride (KLOR-CON) 10 MEQ tablet Take 10 mEq by mouth daily.     sodium chloride (OCEAN) 0.65 % nasal spray Place 1 spray into the nose as needed for congestion.     triamcinolone cream (KENALOG) 0.1 % SMARTSIG:1 Application Topical 2-3 Times Daily     No facility-administered medications prior to visit.   Final Medications at End of Visit    Current Meds  Medication Sig   Artificial Tear Solution (SYSTANE CONTACTS) SOLN Apply to eye.   bacitracin ophthalmic ointment Place 025 application into both eyes 3 (three) times daily.   Calcium Carbonate Antacid (TUMS PO) Take by mouth daily as needed. chewable   Calcium Carbonate-Vit D-Min (CALTRATE PLUS  PO) Take by mouth.     denosumab (PROLIA) 60 MG/ML SOLN injection Inject 60 mg into the skin every 6 (six) months. Administer in upper arm, thigh, or abdomen   diltiazem (CARDIZEM CD) 240 MG 24 hr capsule Take 240 mg by mouth daily.     ergocalciferol (VITAMIN D2) 50000 UNITS capsule Take 50,000 Units by mouth 2 (two) times a week.    famotidine (PEPCID) 20 MG tablet Take 20 mg by mouth 2 (two) times daily.   fluocinonide (LIDEX) 0.05 % external solution Apply topically.   fluticasone (FLONASE) 50 MCG/ACT nasal spray    furosemide (LASIX) 20 MG tablet Take 20 mg by mouth 3 (three) times daily. Patient states she takes 2 tablets in the morning and 1 tablet at 2pm each day.   hyoscyamine (LEVSIN SL) 0.125 MG SL tablet    ketoconazole (NIZORAL) 2 % cream SMARTSIG:1 Topical Daily PRN   lansoprazole (PREVACID) 30 MG capsule Take 30 mg by mouth 2 (two) times daily before a meal.    losartan-hydrochlorothiazide (HYZAAR) 100-12.5 MG per tablet Take 12.5 tablets by mouth daily.   montelukast (SINGULAIR) 10 MG tablet Take 10 mg by mouth daily.   Multiple Vitamins-Minerals (PRESERVISION/LUTEIN) CAPS Take 2 capsules by mouth daily.   Omega-3 Fatty Acids (FISH OIL) 1200 MG CAPS Take 2 capsules by mouth daily.   Polyethyl Glycol-Propyl Glycol 0.4-0.3 % SOLN Apply to eye.     potassium chloride (KLOR-CON) 10 MEQ tablet Take 10 mEq by mouth daily.   sodium chloride (OCEAN) 0.65 % nasal spray Place 1 spray into the nose as needed for congestion.   triamcinolone cream (KENALOG) 0.1 % SMARTSIG:1 Application Topical 2-3 Times Daily   Radiology:   No results found.  Cardiac Studies:   Ambulatory cardiac telemetry 14 days (08/05/2021- 08/19/2021):  Predominant underlying rhythm was sinus.  Patient with minimum heart rate 42 bpm, maximum heart rate 193 bpm, average heart rate 67 bpm.  Patient has underlying bundle branch block.  57 episodes of supraventricular tachycardia, some of which more consistent with atrial  tachycardia.  Longest episode lasting 12 beats.  Patient had rare PVCs.  However she had frequent PACs with PAC burden of 15%.  No evidence of ventricular tachycardia, atrial fibrillation, high degree AV block, pauses >3 seconds.  There were no patient triggered events.  PCV ECHOCARDIOGRAM COMPLETE 08/04/2021 Left ventricle cavity is normal in size. Moderate concentric hypertrophy of the left ventricle. Normal global wall motion. Normal LV systolic function with EF 61%. Doppler evidence of grade I (impaired) diastolic dysfunction, normal LAP. Left atrial cavity is mildly dilated. Trileaflet aortic valve with mild aortic valve leaflet calcification. Trace aortic valve stenosis. Mild tricuspid regurgitation. No evidence of pulmonary hypertension.   PCV MYOCARDIAL PERFUSION WITH LEXISCAN 08/05/2021 Lexiscan nuclear stress test performed using 1-day protocol. Normal myocardial perfusion. Stress LVEF 67%. Low risk study.  EKG:   07/27/2021: Sinus rhythm with frequent PACs at a rate of 72 bpm.  Normal axis.  Right bundle branch block with secondary ST-T wave changes, cannot exclude ischemia.  Assessment     ICD-10-CM   1. PAC (premature atrial contraction)  I49.1     2. Palpitations  R00.2        There are no discontinued medications.   No orders of the defined types were placed in this encounter.   Recommendations:   ROZANNE HEUMANN is a 87 y.o. Caucasian female with history of hypertension, hyperlipidemia, muscular dystrophy (follows with [facility]), IBS-D.  Denies history of tobacco or alcohol use, diabetes, family premature CAD.   Patient presents for follow-up of cardiac testing.  She was originally referred to our office by PCP for irregular heart rhythm.  Cardiac monitor showed no evidence of significant cardiac arrhythmias, no A. fib.  She does have frequent PACs with PAC burden of approximately 15%.  Echocardiogram notes preserved LVEF with moderate LVH  and grade 1 diastolic dysfunction, otherwise no significant abnormalities.  Stress test is overall low risk.  Patient is fairly asymptomatic with only mild dyspnea on exertion.  Discussed at length with patient, her daughter, and her son-in-law regarding management options.  Discussed additional medical therapy (addition of low-dose beta-blocker) versus referral to electrophysiology for further evaluation versus watchful waiting.  Discussed risks versus benefits of each management strategy.  Given the patient is feeling well and there is no significant abnormalities noted on cardiac testing shared decision was to proceed with watchful waiting.  Will defer further management of hyperlipidemia to PCP.  Patient is otherwise stable from a cardiovascular standpoint.  Follow-up in 6 months, sooner if needed, for PACs, hypertension, hyperlipidemia.  May consider repeat echocardiogram at that time.   Alethia Berthold, PA-C 09/10/2021, 3:13 PM Office: [phone]

## 2021-09-21 ENCOUNTER — Other Ambulatory Visit (HOSPITAL_COMMUNITY): Payer: Self-pay

## 2021-09-22 ENCOUNTER — Ambulatory Visit (HOSPITAL_COMMUNITY)
Admission: RE | Admit: 2021-09-22 | Discharge: 2021-09-22 | Disposition: A | Discharge status: Home / Self Care | Payer: PPO | Source: Ambulatory Visit | Attending: Endocrinology | Admitting: Endocrinology

## 2021-09-22 ENCOUNTER — Other Ambulatory Visit: Payer: Self-pay

## 2021-09-22 DIAGNOSIS — M810 Age-related osteoporosis without current pathological fracture: Secondary | ICD-10-CM | POA: Insufficient documentation

## 2021-09-22 MED ORDER — DENOSUMAB 60 MG/ML ~~LOC~~ SOSY: 60.0000 mg | PREFILLED_SYRINGE | Freq: Once | SUBCUTANEOUS | Status: DC

## 2021-09-22 MED ORDER — DENOSUMAB 60 MG/ML ~~LOC~~ SOSY
PREFILLED_SYRINGE | SUBCUTANEOUS | Status: AC
  Administered 2021-09-22: 60 mg via SUBCUTANEOUS
  Filled 2021-09-22: qty 1

## 2021-10-01 DIAGNOSIS — R109 Unspecified abdominal pain: Secondary | ICD-10-CM | POA: Diagnosis not present

## 2021-10-01 DIAGNOSIS — K580 Irritable bowel syndrome with diarrhea: Secondary | ICD-10-CM | POA: Diagnosis not present

## 2021-10-01 DIAGNOSIS — R197 Diarrhea, unspecified: Secondary | ICD-10-CM | POA: Diagnosis not present

## 2021-12-14 DIAGNOSIS — H353221 Exudative age-related macular degeneration, left eye, with active choroidal neovascularization: Secondary | ICD-10-CM | POA: Diagnosis not present

## 2021-12-14 DIAGNOSIS — H35372 Puckering of macula, left eye: Secondary | ICD-10-CM | POA: Diagnosis not present

## 2021-12-14 DIAGNOSIS — H35453 Secondary pigmentary degeneration, bilateral: Secondary | ICD-10-CM | POA: Diagnosis not present

## 2021-12-14 DIAGNOSIS — H353112 Nonexudative age-related macular degeneration, right eye, intermediate dry stage: Secondary | ICD-10-CM | POA: Diagnosis not present

## 2021-12-14 DIAGNOSIS — H35363 Drusen (degenerative) of macula, bilateral: Secondary | ICD-10-CM | POA: Diagnosis not present

## 2021-12-16 DIAGNOSIS — H353221 Exudative age-related macular degeneration, left eye, with active choroidal neovascularization: Secondary | ICD-10-CM | POA: Diagnosis not present

## 2021-12-20 IMAGING — CT CT HEAD W/O CM
1 series · 16 of 30 positions shown, 20 images · non-contrast
Comparison: None.

CLINICAL DATA: Facial numbness and pain with headaches

EXAM:
CT HEAD WITHOUT CONTRAST
TECHNIQUE: Contiguous axial images were obtained from the base of the skull
through the vertex without intravenous contrast.

[Series 2: head w/(date) · axial · 0.43mm/px · z∈[+751,+906]mm · 16 of 35 slices shown, 20 images]
[im 2/35  brain]
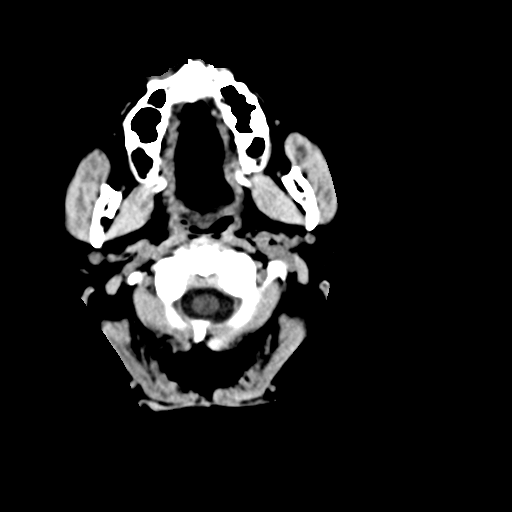
[im 2/35  bone]
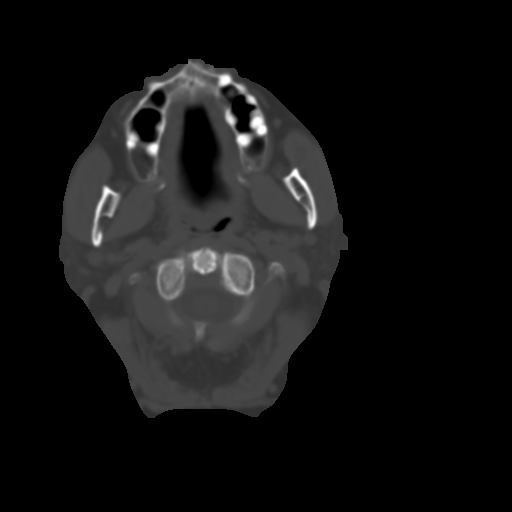
[im 4/35  brain]
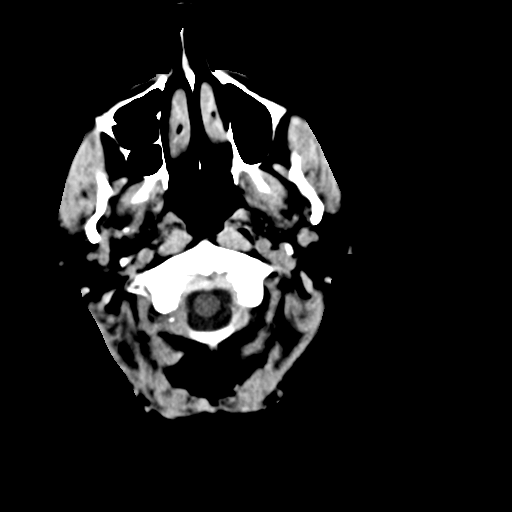
[im 6/35  brain]
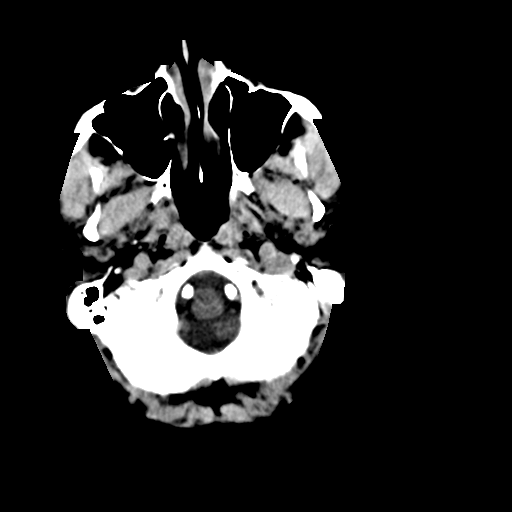
[im 9/35  brain]
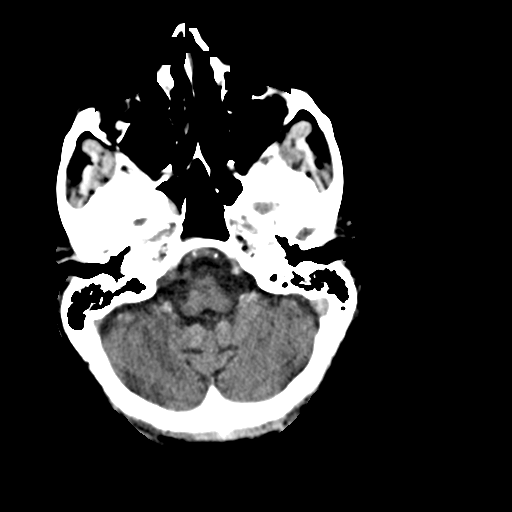
[im 10/35  brain]
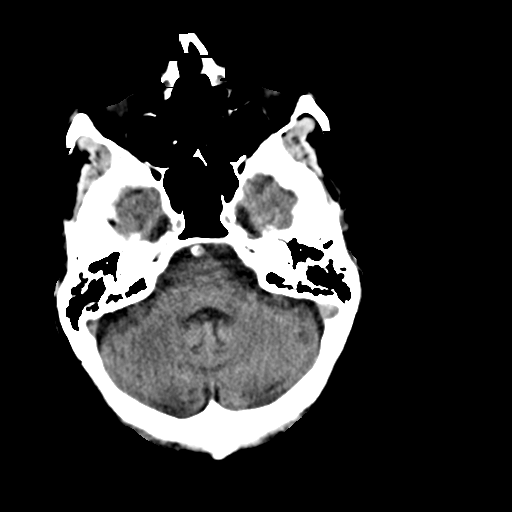
[im 10/35  bone]
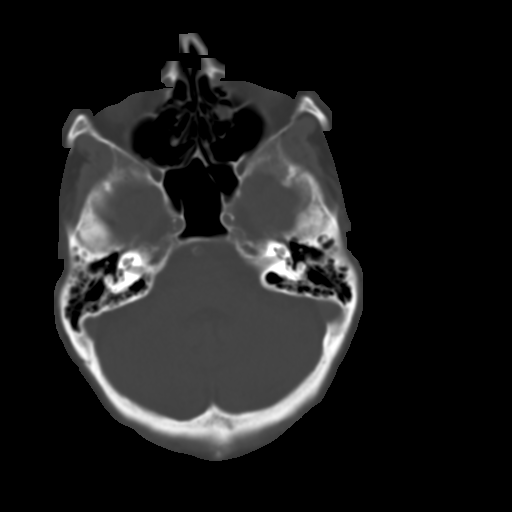
[im 12/35  brain]
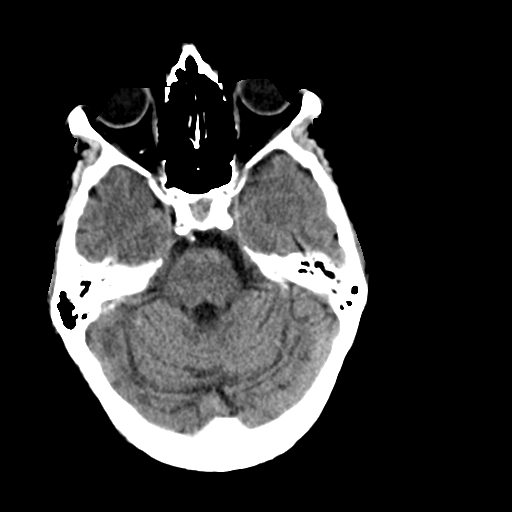
[im 15/35  brain]
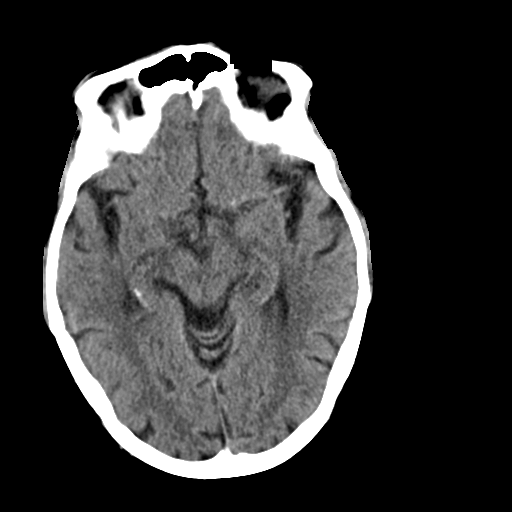
[im 17/35  brain]
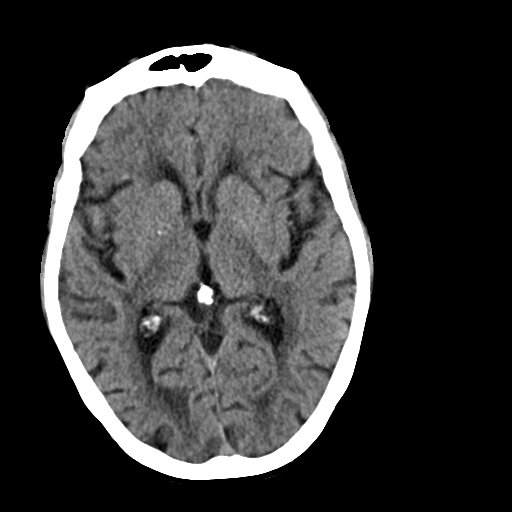
[im 18/35  brain]
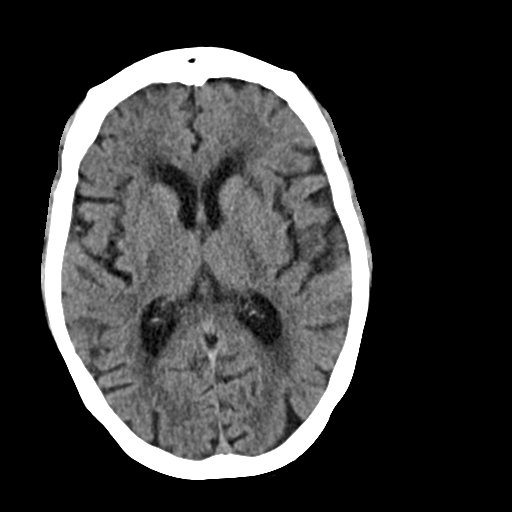
[im 18/35  bone]
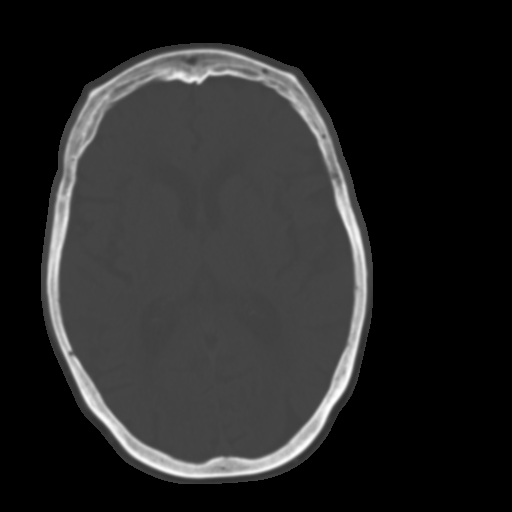
[im 20/35  brain]
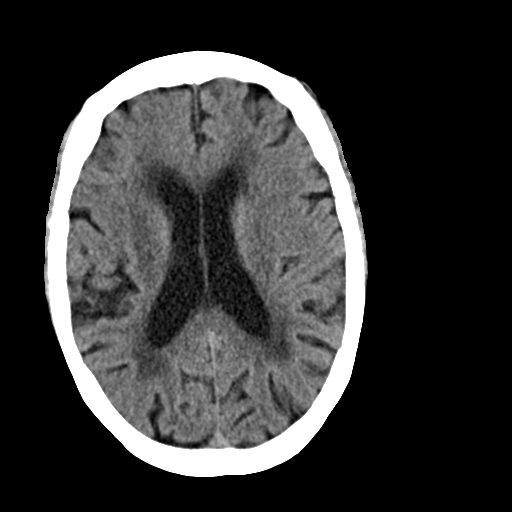
[im 23/35  brain]
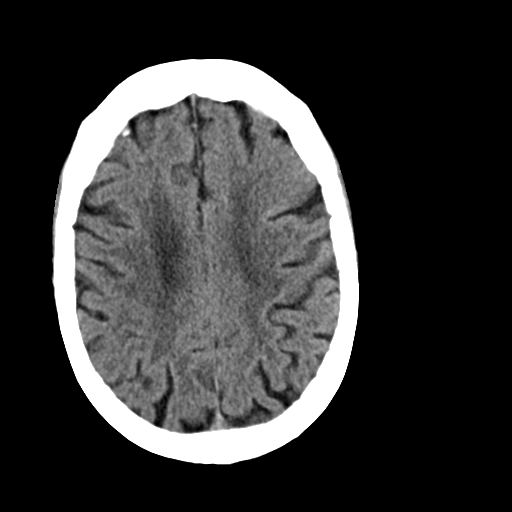
[im 25/35  brain]
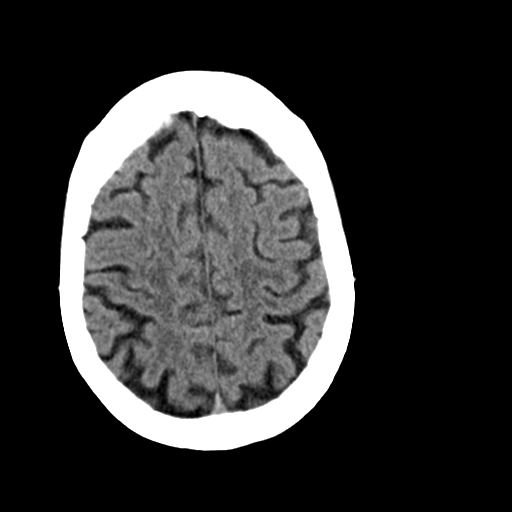
[im 26/35  brain]
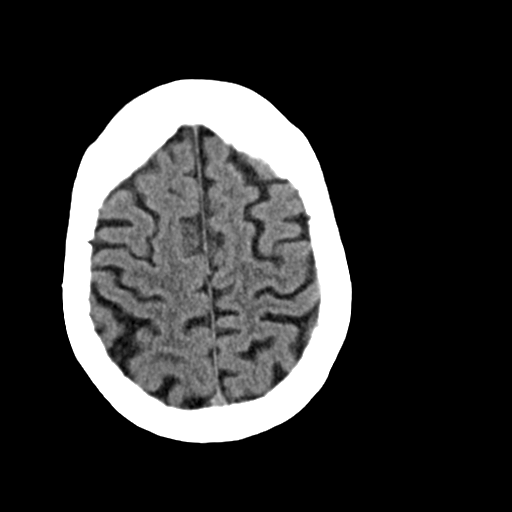
[im 26/35  bone]
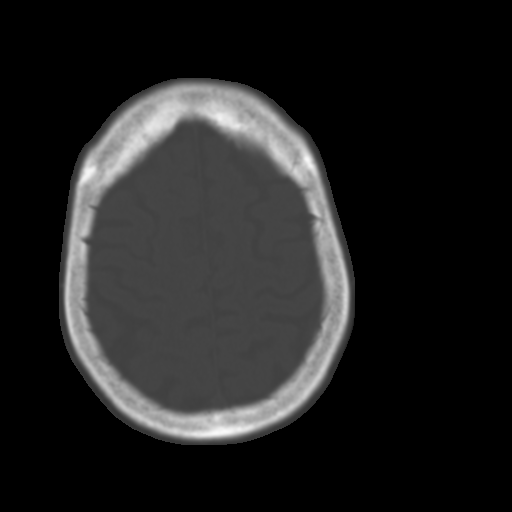
[im 29/35  brain]
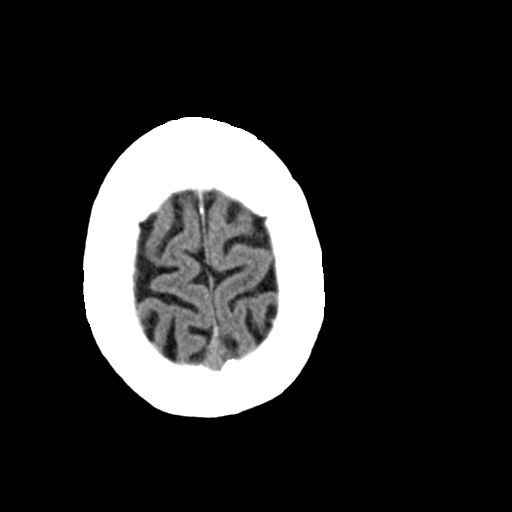
[im 31/35  brain]
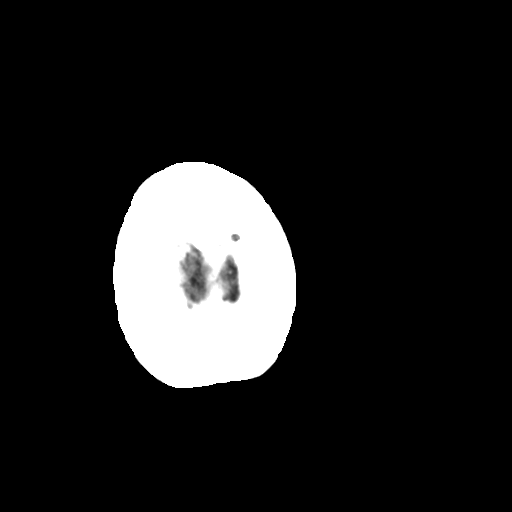
[im 33/35  brain]
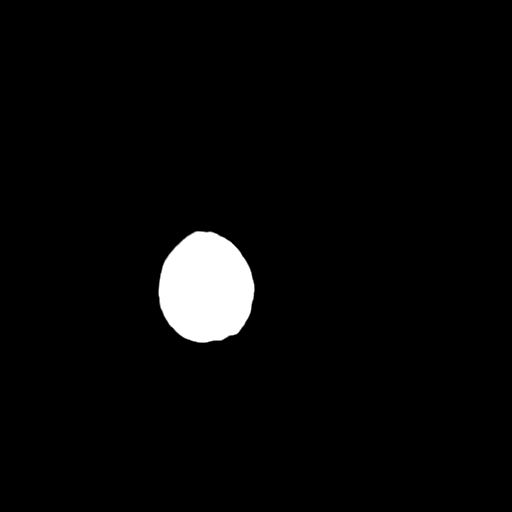

[16 of 30 positions shown; findings below may reference images not displayed]

FINDINGS: Brain: There is no acute intracranial hemorrhage, mass effect, or
edema. Gray-white differentiation is preserved. There is no
extra-axial fluid collection. Patchy and confluent areas of
hypoattenuation in the supratentorial white matter are nonspecific
but may reflect moderate chronic microvascular ischemic changes.
Prominence of the ventricles and sulci reflects generalized
parenchymal volume loss.

Vascular: There is atherosclerotic calcification at the skull base.

Skull: Calvarium is unremarkable.

Sinuses/Orbits: No acute finding.

Other: None.
IMPRESSION: No acute intracranial abnormality. Moderate chronic microvascular
ischemic changes.

## 2021-12-23 DIAGNOSIS — R7301 Impaired fasting glucose: Secondary | ICD-10-CM | POA: Diagnosis not present

## 2021-12-23 DIAGNOSIS — I10 Essential (primary) hypertension: Secondary | ICD-10-CM | POA: Diagnosis not present

## 2021-12-23 DIAGNOSIS — R109 Unspecified abdominal pain: Secondary | ICD-10-CM | POA: Diagnosis not present

## 2021-12-23 DIAGNOSIS — E785 Hyperlipidemia, unspecified: Secondary | ICD-10-CM | POA: Diagnosis not present

## 2021-12-23 DIAGNOSIS — E559 Vitamin D deficiency, unspecified: Secondary | ICD-10-CM | POA: Diagnosis not present

## 2021-12-23 DIAGNOSIS — K529 Noninfective gastroenteritis and colitis, unspecified: Secondary | ICD-10-CM | POA: Diagnosis not present

## 2021-12-23 DIAGNOSIS — K580 Irritable bowel syndrome with diarrhea: Secondary | ICD-10-CM | POA: Diagnosis not present

## 2021-12-24 DIAGNOSIS — G7109 Other specified muscular dystrophies: Secondary | ICD-10-CM | POA: Diagnosis not present

## 2021-12-24 DIAGNOSIS — I129 Hypertensive chronic kidney disease with stage 1 through stage 4 chronic kidney disease, or unspecified chronic kidney disease: Secondary | ICD-10-CM | POA: Diagnosis not present

## 2021-12-24 DIAGNOSIS — I700 Atherosclerosis of aorta: Secondary | ICD-10-CM | POA: Diagnosis not present

## 2021-12-24 DIAGNOSIS — Z1331 Encounter for screening for depression: Secondary | ICD-10-CM | POA: Diagnosis not present

## 2021-12-24 DIAGNOSIS — I10 Essential (primary) hypertension: Secondary | ICD-10-CM | POA: Diagnosis not present

## 2021-12-24 DIAGNOSIS — I2510 Atherosclerotic heart disease of native coronary artery without angina pectoris: Secondary | ICD-10-CM | POA: Diagnosis not present

## 2021-12-24 DIAGNOSIS — G629 Polyneuropathy, unspecified: Secondary | ICD-10-CM | POA: Diagnosis not present

## 2021-12-24 DIAGNOSIS — I87323 Chronic venous hypertension (idiopathic) with inflammation of bilateral lower extremity: Secondary | ICD-10-CM | POA: Diagnosis not present

## 2021-12-24 DIAGNOSIS — Z0000 Encounter for general adult medical examination without abnormal findings: Secondary | ICD-10-CM | POA: Diagnosis not present

## 2021-12-24 DIAGNOSIS — M810 Age-related osteoporosis without current pathological fracture: Secondary | ICD-10-CM | POA: Diagnosis not present

## 2021-12-24 DIAGNOSIS — E785 Hyperlipidemia, unspecified: Secondary | ICD-10-CM | POA: Diagnosis not present

## 2021-12-24 DIAGNOSIS — Z1389 Encounter for screening for other disorder: Secondary | ICD-10-CM | POA: Diagnosis not present

## 2021-12-24 DIAGNOSIS — I5189 Other ill-defined heart diseases: Secondary | ICD-10-CM | POA: Diagnosis not present

## 2021-12-24 DIAGNOSIS — K760 Fatty (change of) liver, not elsewhere classified: Secondary | ICD-10-CM | POA: Diagnosis not present

## 2021-12-29 ENCOUNTER — Ambulatory Visit (INDEPENDENT_AMBULATORY_CARE_PROVIDER_SITE_OTHER): Payer: PPO | Admitting: Neurology

## 2021-12-29 ENCOUNTER — Encounter: Payer: Self-pay | Admitting: Neurology

## 2021-12-29 VITALS — BP 166/79 | HR 92 | Ht 60.0 in | Wt 152.5 lb

## 2021-12-29 DIAGNOSIS — R269 Unspecified abnormalities of gait and mobility: Secondary | ICD-10-CM

## 2021-12-29 DIAGNOSIS — G71039 Limb girdle muscular dystrophy, unspecified: Secondary | ICD-10-CM

## 2021-12-29 NOTE — Progress Notes (Signed)
? ?[facility] ? ?[name] ?DOB: 07-24-1930 ? ? ?REVISIT- REASON FOR VISIT: Follow-up for gait abnormality. Patient here since 17 years ago, first by Dr Erling Cruz.  ?I have the pleasure of seeing Catherine Harding on 12-29-2021, a meanwhile 88 year old Caucasian right-handed lady that is still able to ambulate but only with a walker.  ?She has not had any falls, is very careful. She has been also diagnosed with macular degeneration. She has IBS- D and feels often limited by this. She is followed by Dr Einar Gip and Dr Forde Dandy.  ?Older Sister in [place] died in 2021-01-11 at 77 -and without LGMD-  Other sister had worse LGMD, deceased. Has seen Dr Erling Cruz .  ?She is almost tearful when she reports her 30 year-old daughter was diagnosed with Alzheimer's dementia and she already noted personality changes. Her younger child is still in high school. 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  ?She was referred to [facility]- and this is the place to be for her.  ? ? ? ?2020: This Patient cannot walk without assistance.  She has been followed in this office for the last 14 years, and has an adult onset limb-girdle muscular dystrophy.  She is using the walker even inside her home.  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  In the past visits we often spoke also about abdominal and flank pain but a CT of the abdomen did not confirm diverticulosis. ?IBS- diarrhea was diagnosed, has been on diuretics also- she has ankle edema. No varicosis.  Her feet are swollen, not weeping.  She can't put compression stocking on without assistance. As soon as she goes to bed she starts Burping. She already excluded onions, peppers and leeks from her diet.  ?Dr Forde Dandy ordered echo and vein examination to no results. She suffers from hypokalemia, too.  Nobody has given her an explanation why the legs keep swelling, painfully.  ? ?Interval history from 06/01/2017, I have the pleasure of seeing Mrs.  Harding N/A scheduled yearly revisit, her last 2 appointments were with NP Cecille Rubin. The patient states that her gait has not further deteriorated, she is using a seated walker, she does have abdominal and flank pain and was thought to suffer from diverticulitis, but his CT of the abdomen has not confirmed the presence of inflammatory changes. She now contracted a urinary tract infection, she has had a difficult summer due to abnormal digestion and infection. She is seeing dr Paulita Fujita GI @ [facility] today.  ?The patient had developed hip pain in the year 01-11-13 and had multiple falls that year, but steroid injections into the hip has helped greatly and she had no recurrence since. Her last fall was last November.  ?She now uses the walker regularly outside of the home, in home she " wobbles ' from wall to furniture. She has had dizzyspells. Improved after d/c Hyoscyamines. ? ? ?[name],  a 88 year old female returns for follow-up on her limb girdle dystrophy and associated gait disorder. She was last seen by me on 07/05/2013. ?She has a history of  limb girdle muscular dystrophy as well as a secondary scoliosis. The scoliosis has led to some stiffness in her lower extremities problems to arise from a chair or from a seated position. The stiffness is worse if she remained seated for a longer period of time. She reports that just driving to town for 30 or 40 minutes would be enough to have  problems. She is however still driving, her problem is to enter and leave the car. In 2007 this established patient underwent a EMG and nerve conduction study, which showed only  a mild peripheral neuropathy. Her neurologic symptoms are otherwise unchanged- she has the expected progression of gait difficulties,  she does have a loss of balance /poor sense of balance and she is walking with a walker to stabilize. She reports using a cane at home. Within the boundaries of her home she can maneuver by holding onto the wall or  furniture if needed. She does not use an assistive device per se indoors. ?She reports sciatic nerve pain. Hip pain- treated with meloxicam and prednisone  But only found relief after sterid injections.   ? she has had 3 falls in the 2014 , but none in 2015- and all occurred when she was not using her cane outdoors. ? ? HPI _ 2016 ;Patients sister was never diagnosed with Limb Girdle-Muscular Dystrophy, but died wheelchair bound at age 28 in 9.  ?Another sister is still alive at age 61 and in good shape. Catherine Harding is now 9. Neither child nor her grandchild have any signs of limb- girdle muscle atrophy.  ?The patient has a history of walking " funny " for probably over a decade may be to. But she was only diagnosed after presenting to the neurology office 9 years ago. There is a family history as her sister was probably affected by the same dystrophy type. None of her children have shown any symptoms at this time and her grandchildren are unaffected also.  Two of her children have  scoliosis , which can be an early symptom. It was her then 54 year old grandson who spotted and named her gait " [facility] WALK "  ?She does report left-sided sciatica and she has a very palpable tender spot at L5 L4. The patella reflex is preserved. She found some relief after an injection. I would like for her to have physical therapy and perhaps massage therapy. Will refer to PT , she had not been seen by neuro rehab.  ? ?  ? ?REVIEW OF SYSTEMS: Full 14 system review of systems performed and notable only for those listed, all others are neg:  ?Constitutional: Occasional fatigue ?Leg edema , left over right, not hot but warm, painful to touch, not weeping.  ?Vision: impaired by macular generation  ?Abdominal cramping. IBS diarrhea.  ?Musculoskeletal: Muscle cramps, walking difficulty, scoliosis, sciatica. Weakness of gait, limb girdle distribution,"  penguin gait " now dependent on  Handle bars - and walker.  Torn rotator cuff-   ?Allergy/Immunology: N/A  ?  ? ?ALLERGIES: ?Allergies  ?Allergen Reactions  ? Anesthetics, Amide   ? Celebrex [Celecoxib]   ? Ciprofloxacin Swelling  ? Codeine Nausea And Vomiting  ? Morphine And Related Nausea And Vomiting  ? Sulfa Antibiotics   ? ? ?HOME MEDICATIONS: ?Outpatient Medications Prior to Visit  ?Medication Sig Dispense Refill  ? Artificial Tear Solution (SYSTANE CONTACTS) SOLN Apply to eye.    ? bacitracin ophthalmic ointment Place 786 application into both eyes 3 (three) times daily.    ? Calcium Carbonate Antacid (TUMS PO) Take by mouth daily as needed. chewable    ? Calcium Carbonate-Vit D-Min (CALTRATE PLUS PO) Take by mouth.      ? denosumab (PROLIA) 60 MG/ML SOLN injection Inject 60 mg into the skin every 6 (six) months. Administer in upper arm, thigh, or abdomen    ? diltiazem (CARDIZEM CD)  240 MG 24 hr capsule Take 240 mg by mouth daily.      ? ergocalciferol (VITAMIN D2) 50000 UNITS capsule Take 50,000 Units by mouth 2 (two) times a week.     ? famotidine (PEPCID) 20 MG tablet Take 20 mg by mouth 2 (two) times daily.    ? fluocinonide (LIDEX) 0.05 % external solution Apply topically.    ? fluticasone (FLONASE) 50 MCG/ACT nasal spray     ? furosemide (LASIX) 20 MG tablet Take 20 mg by mouth 3 (three) times daily. Patient states she takes 2 tablets in the morning and 1 tablet at 2pm each day.    ? hyoscyamine (LEVSIN SL) 0.125 MG SL tablet     ? ketoconazole (NIZORAL) 2 % cream SMARTSIG:1 Topical Daily PRN    ? lansoprazole (PREVACID) 30 MG capsule Take 30 mg by mouth 2 (two) times daily before a meal.     ? losartan-hydrochlorothiazide (HYZAAR) 100-12.5 MG per tablet Take 12.5 tablets by mouth daily.    ? montelukast (SINGULAIR) 10 MG tablet Take 10 mg by mouth daily.    ? Multiple Vitamins-Minerals (PRESERVISION/LUTEIN) CAPS Take 2 capsules by mouth daily.    ? Omega-3 Fatty Acids (FISH OIL) 1200 MG CAPS Take 2 capsules by mouth daily.    ? Polyethyl Glycol-Propyl Glycol 0.4-0.3 % SOLN Apply  to eye.      ? potassium chloride (KLOR-CON) 10 MEQ tablet Take 10 mEq by mouth daily.    ? Probiotic Product (PROBIOTIC PO) Take 1 Dose by mouth daily.    ? sodium chloride (OCEAN) 0.65 % nasal spray Place 1 spr

## 2021-12-29 NOTE — Patient Instructions (Signed)
Muscular Dystrophy ?Muscular dystrophy is any condition in a group of diseases that are characterized by muscle weakness and muscle loss. This can occur in the muscles that are used for body movements (voluntary muscles) or in the muscles that are used for breathing and heartbeat (involuntary muscles). Most types of muscular dystrophy begin to cause symptoms during childhood. Some types do not start causing symptoms until later in life. ?The most common types of muscular dystrophy include: ?Duchenne. This is the most common type. Symptoms usually begin by age 50 and start in the upper arms and legs. ?Deneise Lever. This type is usually milder than Duchenne. Symptoms usually begin by age 72 and start in the arms and legs. ?Myotonic. This is the most common type that affects adults. Symptoms usually begin by age 13. This type makes muscles tight and unable to relax. It usually starts in the arms, face, hands, or legs. ?Limb-girdle. Weakness may begin in late childhood or adult years. This type starts in the hips and shoulders. ?Facioscapulohumeral. This type affects the face and shoulders. Symptoms may start in childhood or adult years. ?Congenital. This type is present at birth. It causes muscle weakness in the upper body and legs. ?What are the causes? ?This condition is caused by changes (mutations) in the genes that are needed to make proteins for healthy muscles. Different mutations cause different types of muscular dystrophy. These gene mutations may: ?Be present at birth (congenital). The mutation is passed from parent to child (inherited). ?Develop on their own (spontaneous). This occurs when the baby is growing in the womb and the mother is not a carrier of the mutation. ?What increases the risk? ?This condition is more likely to develop in: ?People who have a family history of the condition. ?Males. The most common type of muscular dystrophy occurs more often in boys than in girls. ?What are the signs or  symptoms? ?Symptoms of this condition vary, depending on the type of muscular dystrophy that you have. Early signs and symptoms of muscular dystrophy in childhood may include: ?Frequent falling or clumsiness. ?Having a hard time getting up into a standing position. ?Trouble running, walking, jumping, or climbing stairs. ?A waddling walk. ?Walking on toes. ?Painful or stiff muscles. ?Symptoms of more advanced muscular dystrophy may include: ?Becoming unable to walk. ?Trouble pushing things like a wagon or tricycle. ?Curving of the spine. ?Trouble swallowing. ?Trouble breathing. ?Heart failure. ?How is this diagnosed? ?This condition may be diagnosed with: ?A physical exam. ?Your medical history. ?Blood tests. ?Tests to measure the electrical activity of muscles (electromyogram). ?A procedure to remove a sample of muscle fibers to be examined under a microscope (muscle biopsy). ?Heart and lung function tests. ?How is this treated? ?There is no cure for this condition. However, treatment may slow worsening of the disease and help you to stay active. Treatment may include: ?Occupational, physical, and speech therapy. ?Assistive devices, such as a brace, cane, walker, or wheelchair. ?Medicines to: ?Reduce swelling (steroids). ?Improve heart function and bone health. ?Surgery to correct or prevent complications, such as eye or heart problems. ?Counseling to support your mental health. ?A feeding tube to assist with nutrition. ?A breathing machine (ventilator). ?Follow these instructions at home: ?Learn as much as you can about muscular dystrophy, and work closely with your treatment team. Home care needs may change over time. ?Eating and drinking ? ?Eat a healthy, balanced diet. ?Do not try to treat this condition with special diets or supplements. These do not help to relieve the symptoms  of this condition. ?Activity ?Try to stay physically active. Ask your health care provider what level of physical activity is safe for  you. ?Do physical therapy exercises as directed by a physical therapist. ?Safety ? ?Take steps to prevent falls. If possible: ?Remodel a bathroom to make it accessible for a wheelchair. ?Add a chair or bench to the shower. ?Install grab bars for your tub, shower, and toilet. ?Lower beds to make them easier to get in and out. ?Install assistive devices throughout your home to help you when going from a seated to a standing position. ?If necessary, make changes in your home to make access easier: ?Widen doors and install a wheelchair ramp. ?Change doorknobs to lever handles to make doors easier to open. ?Install pull-out shelving in cabinets. ?Place items such as plates and cups in low drawers. ?Consider "smart home" controls that allow you to turn off lights if you are seated or have left a room. ?General instructions ? ?Take over-the-counter and prescription medicines only as told by the health care provider. ?Stay up to date on all immunizations, including the yearly (annual) flu vaccine. ?Try to maintain a healthy weight. You may need to follow a low-calorie eating plan to maintain your weight if you cannot be active. ?Do not use any products that contain nicotine or tobacco, such as cigarettes and e-cigarettes. If you need help quitting, ask a health care provider. ?Keep all follow-up visits as directed by your health care provider. This is important. ?Where to find more information ?Ask your health care provider to suggest resources in your area for emotional or caregiving support. ?Muscular Dystrophy Association: CallRank.tn ?Contact a health care provider if: ?Your symptoms change. ?You have trouble taking care of yourself at home. ?Get help right away if: ?You have problems with breathing or swallowing. ?It is no longer safe or possible for you to care for yourself at home. ?Summary ?Muscular dystrophy is any condition in a group of diseases that are characterized by muscle weakness and muscle loss. ?Symptoms  of this condition vary, depending on the type of muscular dystrophy. ?There is no cure for muscular dystrophy. However, treatment may slow the worsening of the disease. ?You may need to make changes to your home to make it safer and easier for you to care for yourself. ?This information is not intended to replace advice given to you by your health care provider. Make sure you discuss any questions you have with your health care provider. ?Document Revised: 10/26/2017 Document Reviewed: 10/25/2017 ?Elsevier Patient Education ? [employer]. ? ?

## 2022-01-27 DIAGNOSIS — H353221 Exudative age-related macular degeneration, left eye, with active choroidal neovascularization: Secondary | ICD-10-CM | POA: Diagnosis not present

## 2022-01-27 DIAGNOSIS — M7989 Other specified soft tissue disorders: Secondary | ICD-10-CM | POA: Diagnosis not present

## 2022-01-27 DIAGNOSIS — L03116 Cellulitis of left lower limb: Secondary | ICD-10-CM | POA: Diagnosis not present

## 2022-01-27 DIAGNOSIS — I129 Hypertensive chronic kidney disease with stage 1 through stage 4 chronic kidney disease, or unspecified chronic kidney disease: Secondary | ICD-10-CM | POA: Diagnosis not present

## 2022-01-27 DIAGNOSIS — N1831 Chronic kidney disease, stage 3a: Secondary | ICD-10-CM | POA: Diagnosis not present

## 2022-01-27 DIAGNOSIS — I5189 Other ill-defined heart diseases: Secondary | ICD-10-CM | POA: Diagnosis not present

## 2022-01-27 DIAGNOSIS — R238 Other skin changes: Secondary | ICD-10-CM | POA: Diagnosis not present

## 2022-01-28 DIAGNOSIS — K589 Irritable bowel syndrome without diarrhea: Secondary | ICD-10-CM | POA: Diagnosis not present

## 2022-03-10 NOTE — Progress Notes (Unsigned)
Primary Physician/Referring:  Catherine Bowen, MD  Patient ID: Catherine Harding, female    DOB: 11/15/1929, 88 y.o.   MRN: [id number]  No chief complaint on file.  HPI:    Catherine Harding  is a 88 y.o. Caucasian female with history of hypertension, hyperlipidemia, muscular dystrophy (follows with [facility]), IBS-D.  Denies history of tobacco or alcohol use, diabetes, family premature CAD. Patient has undergone low risk stress test and echocardiogram which noted preserved LVEF with grade 1 diastolic dysfunction and moderate LVH. Previous cardiac monitor revealed PVC burden of 15%, however as patient has been asymptomatic she opted to continue watchful waiting.   Patient now presents for 86 month follow up. ***  ***Order echo.   Patient was referred to our office by PCP for evaluation of irregular heart rhythm.  Patient's EKG at last office visit noted frequent PACs and right bundle branch block, therefore recommended stress test, cardiac monitor, and echocardiogram.  Patient now presents for results with her son and daughter-in-law present at bedside.  Stress test was overall low risk and echocardiogram noted preserved LVEF with grade 1 diastolic dysfunction and moderate LVH.  Cardiac monitor revealed PVC burden of 15%, however patient is completely asymptomatic.  Patient denies palpitations, dyspnea, dizziness, syncope, near syncope.  Denies orthopnea, PND, chest pain.  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   Macular hole of left eye    Muscular dystrophy ([place])    Osteoporosis    pelvic fracture   Pseudocholinesterase deficiency     Scoliosis    Past Surgical History:  Procedure Laterality Date   ABDOMINAL HYSTERECTOMY     TAH BSO   BREAST LUMPECTOMY  1971   benign   CESAREAN SECTION     x2   COLPOSCOPY     EYE SURGERY  2003   left   TOTAL ABDOMINAL HYSTERECTOMY W/ BILATERAL SALPINGOOPHORECTOMY  1981   TRIGGER FINGER RELEASE  07/28/2012   Procedure: RELEASE TRIGGER FINGER/A-1 PULLEY;  Surgeon: Cammie Sickle., MD;  Location: [facility];  Service: Orthopedics;  Laterality: Right;  EXCISION CYST RIGHT LONG A-1 RELEASE A-1 RIGHT LONG    Family History  Problem Relation Age of Onset   Hypertension Sister     Social History   Tobacco Use   Smoking status: Never   Smokeless tobacco: Never  Substance Use Topics   Alcohol use: No    Alcohol/week: 0.0 standard drinks    Comment: rare   Marital Status: Divorced   ROS  Review of Systems  Constitutional: Negative for malaise/fatigue and weight gain.  Cardiovascular:  Positive for dyspnea on exertion (mild). Negative for chest pain, claudication, leg swelling, near-syncope, orthopnea, palpitations, paroxysmal nocturnal dyspnea and syncope.  Neurological:  Negative for dizziness.   Objective  There were no vitals taken for this visit.     12/29/2021   10:33 AM 09/22/2021    1:12 PM 09/10/2021    9:06 AM  Vitals with BMI  Height '5\' 0"'$   '5\' 0"'$   Weight 152 lbs 8 oz  154 lbs  BMI 29.78  30.08  Systolic 785 885 027  Diastolic 79 65 53  Pulse 92 63       Physical Exam Vitals reviewed.  Constitutional:      Appearance: She is obese.  HENT:     Head: Normocephalic and atraumatic.  Cardiovascular:     Rate and Rhythm: Normal rate and regular rhythm. FrequentExtrasystoles are present.    Pulses: Intact distal pulses.     Heart sounds: S1 normal and S2 normal. No murmur heard.   No gallop.  Pulmonary:     Effort: Pulmonary effort is normal. No respiratory distress.     Breath sounds: No wheezing, rhonchi or rales.  Musculoskeletal:      Right lower leg: No edema.     Left lower leg: No edema.  Neurological:     Mental Status: She is alert.  Physical exam unchanged compared to previous office visit.  Laboratory examination:   No results for input(s): NA, K, CL, CO2, GLUCOSE, BUN, CREATININE, CALCIUM, GFRNONAA, GFRAA in the last 8760 hours. CrCl cannot be calculated (Patient's most recent lab result is older than the maximum 21 days allowed.).     Latest Ref Rng & Units 08/28/2012   12:49 PM 07/27/2012   11:00 AM  CMP  Glucose 70 - 99 mg/dL 115   116    BUN 6 - 23 mg/dL 21   14    Creatinine 0.50 - 1.10 mg/dL 0.76   0.65    Sodium 135 - 145 mEq/L 137   141    Potassium 3.5 - 5.3 mEq/L 3.6   4.1    Chloride 96 - 112 mEq/L 102   107    CO2 19 - 32 mEq/L 27   26    Calcium 8.4 - 10.5 mg/dL 10.1     10.1   9.6    Total Protein 6.0 - 8.3 g/dL 6.8     Total Bilirubin 0.3 - 1.2 mg/dL 0.4     Alkaline Phos 39 - 117 U/L 41     AST 0 - 37 U/L 34     ALT 0 - 35 U/L 35         Latest Ref Rng & Units 07/28/2012    9:01 AM  CBC  Hemoglobin 12.0 - 15.0 g/dL 14.0      Lipid Panel No results for input(s): CHOL, TRIG, LDLCALC, VLDL, HDL, CHOLHDL, LDLDIRECT in the last 8760 hours.  HEMOGLOBIN A1C No results found for: HGBA1C, MPG TSH No results for input(s): TSH in the last 8760 hours.  External labs:   06/10/2021: Hgb 13.7, HCT 37.0, platelet 172 BUN 15, creatinine 0.8, GFR >60, sodium 142, potassium 3.8, AST 35, ALT 37  12/01/2020: Triglycerides 234, HDL 37, LDL 107, total cholesterol 191 TSH 1.83 A1c 6.8%  Allergies   Allergies  Allergen Reactions   Anesthetics, Amide    Celebrex [Celecoxib]    Ciprofloxacin Swelling   Codeine Nausea And Vomiting   Morphine And Related Nausea And Vomiting   Sulfa Antibiotics     Medications Prior to Visit:   Outpatient Medications Prior to Visit  Medication Sig Dispense Refill   Artificial Tear Solution (SYSTANE CONTACTS) SOLN Apply to eye.     bacitracin  ophthalmic ointment Place 741 application into both eyes 3 (three) times daily.     Calcium Carbonate Antacid (TUMS PO) Take by mouth daily as needed. chewable     Calcium Carbonate-Vit D-Min (CALTRATE PLUS PO) Take by mouth.  denosumab (PROLIA) 60 MG/ML SOLN injection Inject 60 mg into the skin every 6 (six) months. Administer in upper arm, thigh, or abdomen     diltiazem (CARDIZEM CD) 240 MG 24 hr capsule Take 240 mg by mouth daily.       ergocalciferol (VITAMIN D2) 50000 UNITS capsule Take 50,000 Units by mouth 2 (two) times a week.      famotidine (PEPCID) 20 MG tablet Take 20 mg by mouth 2 (two) times daily.     fluocinonide (LIDEX) 0.05 % external solution Apply topically.     fluticasone (FLONASE) 50 MCG/ACT nasal spray      furosemide (LASIX) 20 MG tablet Take 20 mg by mouth 3 (three) times daily. Patient states she takes 2 tablets in the morning and 1 tablet at 2pm each day.     hyoscyamine (LEVSIN SL) 0.125 MG SL tablet      ketoconazole (NIZORAL) 2 % cream SMARTSIG:1 Topical Daily PRN     lansoprazole (PREVACID) 30 MG capsule Take 30 mg by mouth 2 (two) times daily before a meal.      losartan-hydrochlorothiazide (HYZAAR) 100-12.5 MG per tablet Take 12.5 tablets by mouth daily.     montelukast (SINGULAIR) 10 MG tablet Take 10 mg by mouth daily.     Multiple Vitamins-Minerals (PRESERVISION/LUTEIN) CAPS Take 2 capsules by mouth daily.     Omega-3 Fatty Acids (FISH OIL) 1200 MG CAPS Take 2 capsules by mouth daily.     Polyethyl Glycol-Propyl Glycol 0.4-0.3 % SOLN Apply to eye.       potassium chloride (KLOR-CON) 10 MEQ tablet Take 10 mEq by mouth daily.     Probiotic Product (PROBIOTIC PO) Take 1 Dose by mouth daily.     sodium chloride (OCEAN) 0.65 % nasal spray Place 1 spray into the nose as needed for congestion.     triamcinolone cream (KENALOG) 0.1 % SMARTSIG:1 Application Topical 2-3 Times Daily     No facility-administered medications prior to visit.   Final Medications  at End of Visit    No outpatient medications have been marked as taking for the 03/11/22 encounter (Appointment) with Rayetta Pigg, Aman Batley C, PA-C.   Radiology:   No results found.  Cardiac Studies:   Ambulatory cardiac telemetry 14 days (08/05/2021- 08/19/2021): Predominant underlying rhythm was sinus.  Patient with minimum heart rate 42 bpm, maximum heart rate 193 bpm, average heart rate 67 bpm.  Patient has underlying bundle branch block.  57 episodes of supraventricular tachycardia, some of which more consistent with atrial tachycardia.  Longest episode lasting 12 beats.  Patient had rare PVCs.  However she had frequent PACs with PAC burden of 15%.  No evidence of ventricular tachycardia, atrial fibrillation, high degree AV block, pauses >3 seconds.  There were no patient triggered events.  PCV ECHOCARDIOGRAM COMPLETE 08/04/2021 Left ventricle cavity is normal in size. Moderate concentric hypertrophy of the left ventricle. Normal global wall motion. Normal LV systolic function with EF 61%. Doppler evidence of grade I (impaired) diastolic dysfunction, normal LAP. Left atrial cavity is mildly dilated. Trileaflet aortic valve with mild aortic valve leaflet calcification. Trace aortic valve stenosis. Mild tricuspid regurgitation. No evidence of pulmonary hypertension.   PCV MYOCARDIAL PERFUSION WITH LEXISCAN 08/05/2021 Lexiscan nuclear stress test performed using 1-day protocol. Normal myocardial perfusion. Stress LVEF 67%. Low risk study.  EKG:  ***   07/27/2021: Sinus rhythm with frequent PACs at a rate of 72 bpm.  Normal axis.  Right bundle branch block with secondary ST-T wave  changes, cannot exclude ischemia.  Assessment   No diagnosis found.    There are no discontinued medications.   No orders of the defined types were placed in this encounter.   Recommendations:   Catherine Harding is a 88 y.o. Caucasian female with history of hypertension, hyperlipidemia, muscular  dystrophy (follows with [facility]), IBS-D.  Denies history of tobacco or alcohol use, diabetes, family premature CAD. Patient has undergone low risk stress test and echocardiogram which noted preserved LVEF with grade 1 diastolic dysfunction and moderate LVH. Previous cardiac monitor revealed PVC burden of 15%, however as patient has been asymptomatic she opted to continue watchful waiting.   Patient now presents for 86 month follow up. ***  ***Order echo.   Patient presents for follow-up of cardiac testing.  She was originally referred to our office by PCP for irregular heart rhythm.  Cardiac monitor showed no evidence of significant cardiac arrhythmias, no A. fib.  She does have frequent PACs with PAC burden of approximately 15%.  Echocardiogram notes preserved LVEF with moderate LVH and grade 1 diastolic dysfunction, otherwise no significant abnormalities.  Stress test is overall low risk.  Patient is fairly asymptomatic with only mild dyspnea on exertion.  Discussed at length with patient, her daughter, and her son-in-law regarding management options.  Discussed additional medical therapy (addition of low-dose beta-blocker) versus referral to electrophysiology for further evaluation versus watchful waiting.  Discussed risks versus benefits of each management strategy.  Given the patient is feeling well and there is no significant abnormalities noted on cardiac testing shared decision was to proceed with watchful waiting.  Will defer further management of hyperlipidemia to PCP.  Patient is otherwise stable from a cardiovascular standpoint.  Follow-up in 6 months, sooner if needed, for PACs, hypertension, hyperlipidemia.  May consider repeat echocardiogram at that time.   Alethia Berthold, PA-C 03/10/2022, 12:44 PM Office: [phone]

## 2022-03-11 ENCOUNTER — Encounter: Payer: Self-pay | Admitting: Student

## 2022-03-11 ENCOUNTER — Ambulatory Visit: Payer: PPO | Admitting: Student

## 2022-03-11 VITALS — BP 168/71 | HR 68 | Temp 98.0°F | Resp 16 | Ht 61.0 in | Wt 157.0 lb

## 2022-03-11 DIAGNOSIS — I491 Atrial premature depolarization: Secondary | ICD-10-CM

## 2022-03-11 DIAGNOSIS — R0609 Other forms of dyspnea: Secondary | ICD-10-CM | POA: Diagnosis not present

## 2022-03-11 DIAGNOSIS — R600 Localized edema: Secondary | ICD-10-CM | POA: Diagnosis not present

## 2022-03-17 DIAGNOSIS — H353221 Exudative age-related macular degeneration, left eye, with active choroidal neovascularization: Secondary | ICD-10-CM | POA: Diagnosis not present

## 2022-03-19 ENCOUNTER — Ambulatory Visit: Payer: PPO

## 2022-03-19 DIAGNOSIS — I491 Atrial premature depolarization: Secondary | ICD-10-CM

## 2022-03-19 DIAGNOSIS — R0609 Other forms of dyspnea: Secondary | ICD-10-CM

## 2022-03-23 NOTE — Progress Notes (Signed)
Called and spoke to patient she voiced understanding

## 2022-03-24 ENCOUNTER — Other Ambulatory Visit (HOSPITAL_COMMUNITY): Payer: Self-pay | Admitting: *Deleted

## 2022-03-25 ENCOUNTER — Ambulatory Visit (HOSPITAL_COMMUNITY)
Admission: RE | Admit: 2022-03-25 | Discharge: 2022-03-25 | Disposition: A | Discharge status: Home / Self Care | Payer: PPO | Source: Ambulatory Visit | Attending: Endocrinology | Admitting: Endocrinology

## 2022-03-25 DIAGNOSIS — M810 Age-related osteoporosis without current pathological fracture: Secondary | ICD-10-CM | POA: Insufficient documentation

## 2022-03-25 MED ORDER — DENOSUMAB 60 MG/ML ~~LOC~~ SOSY
PREFILLED_SYRINGE | SUBCUTANEOUS | Status: AC
  Filled 2022-03-25: qty 1

## 2022-03-25 MED ORDER — DENOSUMAB 60 MG/ML ~~LOC~~ SOSY
60.0000 mg | PREFILLED_SYRINGE | Freq: Once | SUBCUTANEOUS | Status: AC
Start: 2022-03-25 — End: 2022-03-25
  Administered 2022-03-25: 60 mg via SUBCUTANEOUS

## 2022-05-05 DIAGNOSIS — H353221 Exudative age-related macular degeneration, left eye, with active choroidal neovascularization: Secondary | ICD-10-CM | POA: Diagnosis not present

## 2022-06-02 DIAGNOSIS — M25562 Pain in left knee: Secondary | ICD-10-CM | POA: Diagnosis not present

## 2022-06-15 DIAGNOSIS — I10 Essential (primary) hypertension: Secondary | ICD-10-CM | POA: Diagnosis not present

## 2022-06-15 DIAGNOSIS — K760 Fatty (change of) liver, not elsewhere classified: Secondary | ICD-10-CM | POA: Diagnosis not present

## 2022-06-15 DIAGNOSIS — G7109 Other specified muscular dystrophies: Secondary | ICD-10-CM | POA: Diagnosis not present

## 2022-06-15 DIAGNOSIS — M810 Age-related osteoporosis without current pathological fracture: Secondary | ICD-10-CM | POA: Diagnosis not present

## 2022-06-15 DIAGNOSIS — I2510 Atherosclerotic heart disease of native coronary artery without angina pectoris: Secondary | ICD-10-CM | POA: Diagnosis not present

## 2022-06-15 DIAGNOSIS — I872 Venous insufficiency (chronic) (peripheral): Secondary | ICD-10-CM | POA: Diagnosis not present

## 2022-06-15 DIAGNOSIS — E785 Hyperlipidemia, unspecified: Secondary | ICD-10-CM | POA: Diagnosis not present

## 2022-06-15 DIAGNOSIS — I700 Atherosclerosis of aorta: Secondary | ICD-10-CM | POA: Diagnosis not present

## 2022-06-15 DIAGNOSIS — I129 Hypertensive chronic kidney disease with stage 1 through stage 4 chronic kidney disease, or unspecified chronic kidney disease: Secondary | ICD-10-CM | POA: Diagnosis not present

## 2022-06-15 DIAGNOSIS — F419 Anxiety disorder, unspecified: Secondary | ICD-10-CM | POA: Diagnosis not present

## 2022-06-15 DIAGNOSIS — R7301 Impaired fasting glucose: Secondary | ICD-10-CM | POA: Diagnosis not present

## 2022-06-15 DIAGNOSIS — I87323 Chronic venous hypertension (idiopathic) with inflammation of bilateral lower extremity: Secondary | ICD-10-CM | POA: Diagnosis not present

## 2022-06-23 DIAGNOSIS — M25562 Pain in left knee: Secondary | ICD-10-CM | POA: Diagnosis not present

## 2022-06-23 DIAGNOSIS — M6281 Muscle weakness (generalized): Secondary | ICD-10-CM | POA: Insufficient documentation

## 2022-06-23 DIAGNOSIS — M179 Osteoarthritis of knee, unspecified: Secondary | ICD-10-CM | POA: Insufficient documentation

## 2022-06-23 DIAGNOSIS — M1712 Unilateral primary osteoarthritis, left knee: Secondary | ICD-10-CM | POA: Diagnosis not present

## 2022-06-30 DIAGNOSIS — H353221 Exudative age-related macular degeneration, left eye, with active choroidal neovascularization: Secondary | ICD-10-CM | POA: Diagnosis not present

## 2022-07-09 DIAGNOSIS — H02403 Unspecified ptosis of bilateral eyelids: Secondary | ICD-10-CM | POA: Diagnosis not present

## 2022-07-09 DIAGNOSIS — Z961 Presence of intraocular lens: Secondary | ICD-10-CM | POA: Diagnosis not present

## 2022-07-09 DIAGNOSIS — H52203 Unspecified astigmatism, bilateral: Secondary | ICD-10-CM | POA: Diagnosis not present

## 2022-07-20 DIAGNOSIS — D2271 Melanocytic nevi of right lower limb, including hip: Secondary | ICD-10-CM | POA: Diagnosis not present

## 2022-07-20 DIAGNOSIS — L820 Inflamed seborrheic keratosis: Secondary | ICD-10-CM | POA: Diagnosis not present

## 2022-07-20 DIAGNOSIS — D2272 Melanocytic nevi of left lower limb, including hip: Secondary | ICD-10-CM | POA: Diagnosis not present

## 2022-07-20 DIAGNOSIS — L309 Dermatitis, unspecified: Secondary | ICD-10-CM | POA: Diagnosis not present

## 2022-07-20 DIAGNOSIS — Z85828 Personal history of other malignant neoplasm of skin: Secondary | ICD-10-CM | POA: Diagnosis not present

## 2022-07-20 DIAGNOSIS — L814 Other melanin hyperpigmentation: Secondary | ICD-10-CM | POA: Diagnosis not present

## 2022-07-20 DIAGNOSIS — L570 Actinic keratosis: Secondary | ICD-10-CM | POA: Diagnosis not present

## 2022-07-20 DIAGNOSIS — L821 Other seborrheic keratosis: Secondary | ICD-10-CM | POA: Diagnosis not present

## 2022-07-20 DIAGNOSIS — D225 Melanocytic nevi of trunk: Secondary | ICD-10-CM | POA: Diagnosis not present

## 2022-07-20 DIAGNOSIS — D2371 Other benign neoplasm of skin of right lower limb, including hip: Secondary | ICD-10-CM | POA: Diagnosis not present

## 2022-07-20 DIAGNOSIS — L738 Other specified follicular disorders: Secondary | ICD-10-CM | POA: Diagnosis not present

## 2022-07-29 DIAGNOSIS — K589 Irritable bowel syndrome without diarrhea: Secondary | ICD-10-CM | POA: Diagnosis not present

## 2022-07-29 DIAGNOSIS — K219 Gastro-esophageal reflux disease without esophagitis: Secondary | ICD-10-CM | POA: Diagnosis not present

## 2022-08-17 DIAGNOSIS — Z1231 Encounter for screening mammogram for malignant neoplasm of breast: Secondary | ICD-10-CM | POA: Diagnosis not present

## 2022-08-17 DIAGNOSIS — Z23 Encounter for immunization: Secondary | ICD-10-CM | POA: Diagnosis not present

## 2022-08-25 DIAGNOSIS — H353221 Exudative age-related macular degeneration, left eye, with active choroidal neovascularization: Secondary | ICD-10-CM | POA: Diagnosis not present

## 2022-09-08 DIAGNOSIS — K589 Irritable bowel syndrome without diarrhea: Secondary | ICD-10-CM | POA: Diagnosis not present

## 2022-09-08 DIAGNOSIS — K219 Gastro-esophageal reflux disease without esophagitis: Secondary | ICD-10-CM | POA: Diagnosis not present

## 2022-09-10 ENCOUNTER — Ambulatory Visit: Payer: PPO | Admitting: Student

## 2022-09-10 ENCOUNTER — Ambulatory Visit: Payer: PPO | Admitting: Internal Medicine

## 2022-09-10 ENCOUNTER — Encounter: Payer: Self-pay | Admitting: Internal Medicine

## 2022-09-10 VITALS — BP 142/78 | HR 87 | Ht 61.0 in | Wt 160.0 lb

## 2022-09-10 DIAGNOSIS — R600 Localized edema: Secondary | ICD-10-CM | POA: Diagnosis not present

## 2022-09-10 DIAGNOSIS — I10 Essential (primary) hypertension: Secondary | ICD-10-CM | POA: Diagnosis not present

## 2022-09-10 DIAGNOSIS — I491 Atrial premature depolarization: Secondary | ICD-10-CM | POA: Insufficient documentation

## 2022-09-10 NOTE — Progress Notes (Signed)
Primary Physician/Referring:  Reynold Bowen, MD  Patient ID: Catherine Harding, female    DOB: Feb 29, 1930, 88 y.o.   MRN: [id number]  Chief Complaint  Patient presents with   premature atrial contraction   Follow-up   HPI:    Catherine Harding  is a 88 y.o. Caucasian female with history of hypertension, hyperlipidemia, muscular dystrophy (follows with [facility]), IBS-D.  Denies history of tobacco or alcohol use, diabetes, family premature CAD. Patient has undergone low risk stress test and echocardiogram which noted preserved LVEF with grade 1 diastolic dysfunction and moderate LVH. Previous cardiac monitor revealed PVC burden of 15%, however as patient has been asymptomatic she opted to continue watchful waiting.   Patient now presents for follow-up visit. She has been doing well since the last time she was here. She does have bilateral lower extremity edema but this is at her baseline. She is not feeling fluttering or heart racing episodes. Patient denies chest pain, shortness of breath, palpitations, diaphoresis, syncope, PND, orthopnea.   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   Macular hole of left eye    Muscular dystrophy ([place])    Osteoporosis    pelvic fracture   Pseudocholinesterase deficiency    Scoliosis    Past Surgical History:  Procedure Laterality Date   ABDOMINAL HYSTERECTOMY     TAH BSO   BREAST LUMPECTOMY  1971   benign   CESAREAN SECTION     x2   COLPOSCOPY     EYE SURGERY  2003   left   TOTAL ABDOMINAL HYSTERECTOMY W/ BILATERAL SALPINGOOPHORECTOMY  1981   TRIGGER FINGER RELEASE  07/28/2012   Procedure:  RELEASE TRIGGER FINGER/A-1 PULLEY;  Surgeon: Cammie Sickle., MD;  Location: [facility];  Service: Orthopedics;  Laterality: Right;  EXCISION CYST RIGHT LONG A-1 RELEASE A-1 RIGHT LONG    Family History  Problem Relation Age of Onset   Hypertension Sister     Social History   Tobacco Use   Smoking status: Never   Smokeless tobacco: Never  Substance Use Topics   Alcohol use: No    Alcohol/week: 0.0 standard drinks of alcohol    Comment: rare   Marital Status: Divorced   ROS  Review of Systems  Constitutional: Negative for malaise/fatigue and weight gain.  Cardiovascular:  Negative for chest pain, claudication, dyspnea on exertion, leg swelling, near-syncope, orthopnea, palpitations, paroxysmal nocturnal dyspnea and syncope.  Neurological:  Negative for dizziness.    Objective  Blood pressure (!) 142/78, pulse 87, height '5\' 1"'$  (1.549 m), weight 160 lb (72.6 kg), SpO2 94 %.     09/10/2022   11:54 AM 09/10/2022   11:45 AM 03/25/2022   12:58 PM  Vitals with BMI  Height  '5\' 1"'$    Weight  160 lbs   BMI  63.78   Systolic 588 502 774  Diastolic 78 83 70  Pulse 87 89 95      Physical Exam Vitals reviewed.  Constitutional:      Appearance: She is obese.  Cardiovascular:     Rate and Rhythm:  Normal rate and regular rhythm. No extrasystoles are present.    Pulses: Intact distal pulses.     Heart sounds: S1 normal and S2 normal. No murmur heard.    No gallop.  Pulmonary:     Effort: Pulmonary effort is normal. No respiratory distress.     Breath sounds: No wheezing, rhonchi or rales.  Musculoskeletal:     Right lower leg: Edema (trace) present.     Left lower leg: Edema (1+ pitting) present.  Neurological:     Mental Status: She is alert.    Laboratory examination:   No results for input(s): "NA", "K", "CL", "CO2", "GLUCOSE", "BUN", "CREATININE", "CALCIUM", "GFRNONAA", "GFRAA" in the last 8760 hours. CrCl cannot be calculated (Patient's most recent lab  result is older than the maximum 21 days allowed.).     Latest Ref Rng & Units 08/28/2012   12:49 PM 07/27/2012   11:00 AM  CMP  Glucose 70 - 99 mg/dL 115  116   BUN 6 - 23 mg/dL 21  14   Creatinine 0.50 - 1.10 mg/dL 0.76  0.65   Sodium 135 - 145 mEq/L 137  141   Potassium 3.5 - 5.3 mEq/L 3.6  4.1   Chloride 96 - 112 mEq/L 102  107   CO2 19 - 32 mEq/L 27  26   Calcium 8.4 - 10.5 mg/dL 8.4 - 10.5 mg/dL 10.1    10.1  9.6   Total Protein 6.0 - 8.3 g/dL 6.8    Total Bilirubin 0.3 - 1.2 mg/dL 0.4    Alkaline Phos 39 - 117 U/L 41    AST 0 - 37 U/L 34    ALT 0 - 35 U/L 35        Latest Ref Rng & Units 07/28/2012    9:01 AM  CBC  Hemoglobin 12.0 - 15.0 g/dL 14.0     Lipid Panel No results for input(s): "CHOL", "TRIG", "LDLCALC", "VLDL", "HDL", "CHOLHDL", "LDLDIRECT" in the last 8760 hours.  HEMOGLOBIN A1C No results found for: "HGBA1C", "MPG" TSH No results for input(s): "TSH" in the last 8760 hours.  External labs:   06/10/2021: Hgb 13.7, HCT 37.0, platelet 172 BUN 15, creatinine 0.8, GFR >60, sodium 142, potassium 3.8, AST 35, ALT 37  12/01/2020: Triglycerides 234, HDL 37, LDL 107, total cholesterol 191 TSH 1.83 A1c 6.8%  Allergies   Allergies  Allergen Reactions   Anesthetics, Amide    Celebrex [Celecoxib]    Ciprofloxacin Swelling   Codeine Nausea And Vomiting   Morphine And Related Nausea And Vomiting   Sulfa Antibiotics     Medications Prior to Visit:   Outpatient Medications Prior to Visit  Medication Sig Dispense Refill   Artificial Tear Solution (SYSTANE CONTACTS) SOLN Apply to eye.     bacitracin ophthalmic ointment Place 725 application into both eyes 3 (three) times daily.     Calcium Carbonate Antacid (TUMS PO) Take by mouth daily as needed. chewable     Calcium Carbonate-Vit D-Min (CALTRATE PLUS PO) Take by mouth.       denosumab (PROLIA) 60 MG/ML SOLN injection Inject 60 mg into the skin every 6 (six) months. Administer in upper arm, thigh, or  abdomen     diltiazem (CARDIZEM CD) 240 MG 24 hr capsule Take 240 mg by mouth daily.       ergocalciferol (VITAMIN D2) 50000 UNITS capsule Take 50,000 Units by mouth 2 (two) times a week.      famotidine (PEPCID) 20 MG tablet  Take 20 mg by mouth 2 (two) times daily.     fluocinonide (LIDEX) 0.05 % external solution Apply topically.     fluticasone (FLONASE) 50 MCG/ACT nasal spray      furosemide (LASIX) 20 MG tablet Take 20 mg by mouth 3 (three) times daily. Patient states she takes 2 tablets in the morning and 1 tablet at 2pm each day.     hyoscyamine (LEVSIN SL) 0.125 MG SL tablet      ketoconazole (NIZORAL) 2 % cream SMARTSIG:1 Topical Daily PRN     losartan-hydrochlorothiazide (HYZAAR) 100-12.5 MG per tablet Take 12.5 tablets by mouth daily.     montelukast (SINGULAIR) 10 MG tablet Take 10 mg by mouth daily.     Multiple Vitamins-Minerals (PRESERVISION/LUTEIN) CAPS Take 2 capsules by mouth daily.     Omega-3 Fatty Acids (FISH OIL) 1200 MG CAPS Take 2 capsules by mouth daily.     Polyethyl Glycol-Propyl Glycol 0.4-0.3 % SOLN Apply to eye.       Probiotic Product (PROBIOTIC PO) Take 1 Dose by mouth daily.     sodium chloride (OCEAN) 0.65 % nasal spray Place 1 spray into the nose as needed for congestion.     triamcinolone cream (KENALOG) 0.1 % SMARTSIG:1 Application Topical 2-3 Times Daily     potassium chloride (KLOR-CON) 10 MEQ tablet Take 10 mEq by mouth daily. (Patient not taking: Reported on 09/10/2022)     lansoprazole (PREVACID) 30 MG capsule Take 30 mg by mouth 2 (two) times daily before a meal.  (Patient not taking: Reported on 09/10/2022)     No facility-administered medications prior to visit.   Final Medications at End of Visit    Current Meds  Medication Sig   Artificial Tear Solution (SYSTANE CONTACTS) SOLN Apply to eye.   bacitracin ophthalmic ointment Place 202 application into both eyes 3 (three) times daily.   Calcium Carbonate Antacid (TUMS PO) Take by mouth daily as  needed. chewable   Calcium Carbonate-Vit D-Min (CALTRATE PLUS PO) Take by mouth.     denosumab (PROLIA) 60 MG/ML SOLN injection Inject 60 mg into the skin every 6 (six) months. Administer in upper arm, thigh, or abdomen   diltiazem (CARDIZEM CD) 240 MG 24 hr capsule Take 240 mg by mouth daily.     ergocalciferol (VITAMIN D2) 50000 UNITS capsule Take 50,000 Units by mouth 2 (two) times a week.    famotidine (PEPCID) 20 MG tablet Take 20 mg by mouth 2 (two) times daily.   fluocinonide (LIDEX) 0.05 % external solution Apply topically.   fluticasone (FLONASE) 50 MCG/ACT nasal spray    furosemide (LASIX) 20 MG tablet Take 20 mg by mouth 3 (three) times daily. Patient states she takes 2 tablets in the morning and 1 tablet at 2pm each day.   hyoscyamine (LEVSIN SL) 0.125 MG SL tablet    ketoconazole (NIZORAL) 2 % cream SMARTSIG:1 Topical Daily PRN   losartan-hydrochlorothiazide (HYZAAR) 100-12.5 MG per tablet Take 12.5 tablets by mouth daily.   montelukast (SINGULAIR) 10 MG tablet Take 10 mg by mouth daily.   Multiple Vitamins-Minerals (PRESERVISION/LUTEIN) CAPS Take 2 capsules by mouth daily.   Omega-3 Fatty Acids (FISH OIL) 1200 MG CAPS Take 2 capsules by mouth daily.   Polyethyl Glycol-Propyl Glycol 0.4-0.3 % SOLN Apply to eye.     Probiotic Product (PROBIOTIC PO) Take 1 Dose by mouth daily.   sodium chloride (OCEAN) 0.65 % nasal spray Place 1 spray into the nose as needed for congestion.  triamcinolone cream (KENALOG) 0.1 % SMARTSIG:1 Application Topical 2-3 Times Daily   Radiology:   No results found.  Cardiac Studies:   Ambulatory cardiac telemetry 14 days (08/05/2021- 08/19/2021): Predominant underlying rhythm was sinus.  Patient with minimum heart rate 42 bpm, maximum heart rate 193 bpm, average heart rate 67 bpm.  Patient has underlying bundle branch block.  57 episodes of supraventricular tachycardia, some of which more consistent with atrial tachycardia.  Longest episode lasting 12  beats.  Patient had rare PVCs.  However she had frequent PACs with PAC burden of 15%.  No evidence of ventricular tachycardia, atrial fibrillation, high degree AV block, pauses >3 seconds.  There were no patient triggered events.  PCV ECHOCARDIOGRAM COMPLETE 08/04/2021 Left ventricle cavity is normal in size. Moderate concentric hypertrophy of the left ventricle. Normal global wall motion. Normal LV systolic function with EF 61%. Doppler evidence of grade I (impaired) diastolic dysfunction, normal LAP. Left atrial cavity is mildly dilated. Trileaflet aortic valve with mild aortic valve leaflet calcification. Trace aortic valve stenosis. Mild tricuspid regurgitation. No evidence of pulmonary hypertension.   PCV MYOCARDIAL PERFUSION WITH LEXISCAN 08/05/2021 Lexiscan nuclear stress test performed using 1-day protocol. Normal myocardial perfusion. Stress LVEF 67%. Low risk study.  EKG:  09/10/2022: sinus rhythm with RBBB with secondary ST wave changes. No significant change from prior  03/11/2022: Sinus rhythm rate of 61 bpm.  Left axis.  Right bundle branch block with secondary ST-T wave changes, cannot exclude ischemia.  Compared EKG 07/27/2021, no PACs.   07/27/2021: Sinus rhythm with frequent PACs at a rate of 72 bpm.  Normal axis.  Right bundle branch block with secondary ST-T wave changes, cannot exclude ischemia.  Assessment     ICD-10-CM   1. PAC (premature atrial contraction)  I49.1 EKG 12-Lead    2. Essential hypertension  I10     3. Bilateral leg edema  R60.0        Medications Discontinued During This Encounter  Medication Reason   lansoprazole (PREVACID) 30 MG capsule Completed Course     No orders of the defined types were placed in this encounter.   Recommendations:   Catherine Harding is a 88 y.o. Caucasian female with history of hypertension, hyperlipidemia, muscular dystrophy (follows with [facility]), IBS-D.  Denies history of tobacco or  alcohol use, diabetes, family premature CAD. Patient has undergone low risk stress test and echocardiogram which noted preserved LVEF with grade 1 diastolic dysfunction and moderate LVH. Previous cardiac monitor revealed PAC burden of 15%, however as patient has been asymptomatic she opted to continue watchful waiting.   PAC (premature atrial contraction) Continue cardizem, symptoms well controlled on this   Essential hypertension Continue current cardiac medications. Encourage low-sodium diet, less than 2000 mg daily. BP slightly elevated in office but normally runs <130/80   Bilateral leg edema Continue Lasix  Follow-up in 12 months or sooner if needed    Floydene Flock, DO, [facility] 09/10/2022, 2:49 PM Office: [phone]

## 2022-09-24 ENCOUNTER — Other Ambulatory Visit (HOSPITAL_COMMUNITY): Payer: Self-pay | Admitting: *Deleted

## 2022-09-27 ENCOUNTER — Ambulatory Visit (HOSPITAL_COMMUNITY)
Admission: RE | Admit: 2022-09-27 | Discharge: 2022-09-27 | Disposition: A | Discharge status: Home / Self Care | Payer: PPO | Source: Ambulatory Visit | Attending: Endocrinology | Admitting: Endocrinology

## 2022-09-27 DIAGNOSIS — M810 Age-related osteoporosis without current pathological fracture: Secondary | ICD-10-CM | POA: Diagnosis not present

## 2022-09-27 DIAGNOSIS — M25561 Pain in right knee: Secondary | ICD-10-CM | POA: Diagnosis not present

## 2022-09-27 MED ORDER — DENOSUMAB 60 MG/ML ~~LOC~~ SOSY
60.0000 mg | PREFILLED_SYRINGE | Freq: Once | SUBCUTANEOUS | Status: AC
  Administered 2022-09-27: 60 mg via SUBCUTANEOUS

## 2022-09-27 MED ORDER — DENOSUMAB 60 MG/ML ~~LOC~~ SOSY
PREFILLED_SYRINGE | SUBCUTANEOUS | Status: AC
  Filled 2022-09-27: qty 1

## 2022-11-03 DIAGNOSIS — H353221 Exudative age-related macular degeneration, left eye, with active choroidal neovascularization: Secondary | ICD-10-CM | POA: Diagnosis not present

## 2022-12-21 DIAGNOSIS — I10 Essential (primary) hypertension: Secondary | ICD-10-CM | POA: Diagnosis not present

## 2022-12-21 DIAGNOSIS — R7301 Impaired fasting glucose: Secondary | ICD-10-CM | POA: Diagnosis not present

## 2022-12-21 DIAGNOSIS — E785 Hyperlipidemia, unspecified: Secondary | ICD-10-CM | POA: Diagnosis not present

## 2022-12-27 DIAGNOSIS — F419 Anxiety disorder, unspecified: Secondary | ICD-10-CM | POA: Diagnosis not present

## 2022-12-27 DIAGNOSIS — I10 Essential (primary) hypertension: Secondary | ICD-10-CM | POA: Diagnosis not present

## 2022-12-27 DIAGNOSIS — E559 Vitamin D deficiency, unspecified: Secondary | ICD-10-CM | POA: Diagnosis not present

## 2022-12-27 DIAGNOSIS — E785 Hyperlipidemia, unspecified: Secondary | ICD-10-CM | POA: Diagnosis not present

## 2022-12-28 DIAGNOSIS — N1831 Chronic kidney disease, stage 3a: Secondary | ICD-10-CM | POA: Diagnosis not present

## 2022-12-28 DIAGNOSIS — I700 Atherosclerosis of aorta: Secondary | ICD-10-CM | POA: Diagnosis not present

## 2022-12-28 DIAGNOSIS — G7109 Other specified muscular dystrophies: Secondary | ICD-10-CM | POA: Diagnosis not present

## 2022-12-28 DIAGNOSIS — Z1331 Encounter for screening for depression: Secondary | ICD-10-CM | POA: Diagnosis not present

## 2022-12-28 DIAGNOSIS — I10 Essential (primary) hypertension: Secondary | ICD-10-CM | POA: Diagnosis not present

## 2022-12-28 DIAGNOSIS — M810 Age-related osteoporosis without current pathological fracture: Secondary | ICD-10-CM | POA: Diagnosis not present

## 2022-12-28 DIAGNOSIS — Z1339 Encounter for screening examination for other mental health and behavioral disorders: Secondary | ICD-10-CM | POA: Diagnosis not present

## 2022-12-28 DIAGNOSIS — I5189 Other ill-defined heart diseases: Secondary | ICD-10-CM | POA: Diagnosis not present

## 2022-12-28 DIAGNOSIS — I1310 Hypertensive heart and chronic kidney disease without heart failure, with stage 1 through stage 4 chronic kidney disease, or unspecified chronic kidney disease: Secondary | ICD-10-CM | POA: Diagnosis not present

## 2022-12-28 DIAGNOSIS — F419 Anxiety disorder, unspecified: Secondary | ICD-10-CM | POA: Diagnosis not present

## 2022-12-28 DIAGNOSIS — E669 Obesity, unspecified: Secondary | ICD-10-CM | POA: Diagnosis not present

## 2022-12-28 DIAGNOSIS — R7301 Impaired fasting glucose: Secondary | ICD-10-CM | POA: Diagnosis not present

## 2022-12-28 DIAGNOSIS — Z0000 Encounter for general adult medical examination without abnormal findings: Secondary | ICD-10-CM | POA: Diagnosis not present

## 2022-12-28 NOTE — Progress Notes (Unsigned)
[name] DOB: July 28, 1930  REASON FOR VISIT: follow up HISTORY FROM: patient  No chief complaint on file.     HISTORY OF PRESENT ILLNESS:  12/28/22 ALL: Catherine Harding returns for follow up for muscular dystrophy. She was last seen by Dr Catherine Harding 12/2021.   06/23/2021 ALL: Catherine Harding returns for follow up. She feels that she is doing fairly well. No significant changes. She continues to have bilateral lower extremity weakness and low back/hip discomfort. She has numbness of her toes, bilaterally, but is not overly bothered by this. She is using her Rolator or walker at all times. She continues to live alone. She is able to maintain her home but has difficulty cleaning it. She is not able to stand up for longer than about 5 minutes at a time. She can perform ADLs independently. She manages medicaitons and finances. Her grandson mows her yard and her nephew calls every Sunday to check in. She has a neighbor that helps with driving to ophthalmology for eye injections. She can drive short, local distances if she has to. No falls. She has lost her sister at age 63, two nephews this year. She lost her church friend and significant other in the past 2-3 years. She is no longer gong to church.   06/23/2020 ALL:  Catherine Harding is a 89 y.o. female here today for follow up. She feels that increased swelling of bilateral legs have decreased her mobility. She continues to live alone. She is able to perform ADL's independently. She can not get up stairs. She continues to use her Rolator. She has great support with her family. She has family that helps with lawn work. She someone helping her with cleaning her home. She is no longer driving long distances due to vision changes. She is followed by ophthalmology regularly. She denies any falls. She feels that she is doing failry well, overall. She is followed closely by PCP.   HISTORY: (copied from Dr Catherine Harding's note on 06/20/2019)  I have the pleasure  of seeing Catherine Harding today meanwhile 89 year old Caucasian right-handed lady that is still able to ambulate but not without assistance.  She is now using a walker or similar frame.  She has been followed in this office for the last 14 years, and has an adult onset limb-girdle muscular dystrophy.  She is using the walker even inside her home.  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  In the past visits we often spoke also about abdominal and flank pain but a CT of the abdomen did not confirm diverticulosis. IBS- diarrhea was diagnosed, has been on diuretics also- she has ankle edema. No varicosis.  Her feet are swollen, not weeping.  She can't put compression stocking on without assistance. As soon as she goes to bed she starts Burping. She already excluded onions, peppers and leeks from her diet.  Dr Forde Dandy ordered echo and vein examination to no results. She suffers from hypokalemia, too.  Nobody has given her an explanation why the legs keep swelling, painfully.    Interval history from 06/01/2017, I have the pleasure of seeing Catherine Harding N/A scheduled yearly revisit, her last 2 appointments were with NP Catherine Harding. The patient states that her gait has not further deteriorated, she is using a seated walker, she does have abdominal and flank pain and was thought to suffer from diverticulitis, but his CT of the abdomen has not confirmed the presence of inflammatory changes. She now  contracted a urinary tract infection, she has had a difficult summer due to abnormal digestion and infection. She is seeing dr Catherine Harding GI @ [facility] today.  The patient had developed hip pain in the year 2014 and had multiple falls that year, but steroid injections into the hip has helped greatly and she had no recurrence since. Her last fall was last November.  She now uses the walker regularly outside of the home, in home she " wobbles ' from wall to furniture. She has had dizzyspells.  Improved after d/c Hyoscyamines.   Catherine Harding,  a 89 year old female returns for follow-up on her limb girdle dystrophy and associated gait disorder. She was last seen by me on 07/05/2013. 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 The scoliosis has led to some stiffness in her lower extremities problems to arise from a chair or from a seated position. The stiffness is worse if she remained seated for a longer period of time. She reports that just driving to town for 30 or 40 minutes would be enough to have problems. She is however still driving, her problem is to enter and leave the car. In 2007 this established patient underwent a EMG and nerve conduction study, which showed only  a mild peripheral neuropathy. Her neurologic symptoms are otherwise unchanged- she has the expected progression of gait difficulties,  she does have a loss of balance /poor sense of balance and she is walking with a walker to stabilize. She reports using a cane at home. Within the boundaries of her home she can maneuver by holding onto the wall or furniture if needed. She does not use an assistive device per se indoors. She reports sciatic nerve pain. Hip pain- treated with meloxicam and prednisone  But only found relief after sterid injections.    she has had 3 falls in the 2014 , but none in 2015- and all occurred when she was not using her cane outdoors.    HPI _ 2016 ;Patients sister was never diagnosed with Limb Girdle-Muscular Dystrophy, but died wheelchair bound at age 44 in 60. Another sister is still alive at age 74 and in good shape. Catherine Harding is now 32. Neither child nor her grandchild have any signs of muscle atrophy.  The patient has a history of walking " funny " for probably over a decade may be to. But she was only diagnosed after presenting to the neurology office 9 years ago. There is a family history as her sister was probably affected by the same dystrophy type.  None of her children have shown any symptoms at this time and her grandchildren are unaffected also.  Two of her children have  scoliosis , which can be an early symptom. It was her then 67 year old grandson who spotted and named her gait " Dynegy "  She does report left-sided sciatica and she has a very palpable tender spot at L5 L4. The patella reflex is preserved. She found some relief after an injection. I would like for her to have physical therapy and perhaps massage therapy. Will refer to PT , she had not been seen by neuro rehab.      REVIEW OF SYSTEMS: Out of a complete 14 system review of symptoms, the patient complains only of the following symptoms, chronic pain, gait abnormality, imbalance, and all other reviewed systems are negative.   ALLERGIES: Allergies  Allergen Reactions   Anesthetics, Amide  Celebrex [Celecoxib]    Ciprofloxacin Swelling   Codeine Nausea And Vomiting   Morphine And Related Nausea And Vomiting   Sulfa Antibiotics     HOME MEDICATIONS: Outpatient Medications Prior to Visit  Medication Sig Dispense Refill   Artificial Tear Solution (SYSTANE CONTACTS) SOLN Apply to eye.     bacitracin ophthalmic ointment Place [id number] application into both eyes 3 (three) times daily.     Calcium Carbonate Antacid (TUMS PO) Take by mouth daily as needed. chewable     Calcium Carbonate-Vit D-Min (CALTRATE PLUS PO) Take by mouth.       denosumab (PROLIA) 60 MG/ML SOLN injection Inject 60 mg into the skin every 6 (six) months. Administer in upper arm, thigh, or abdomen     diltiazem (CARDIZEM CD) 240 MG 24 hr capsule Take 240 mg by mouth daily.       ergocalciferol (VITAMIN D2) 50000 UNITS capsule Take 50,000 Units by mouth 2 (two) times a week.      famotidine (PEPCID) 20 MG tablet Take 20 mg by mouth 2 (two) times daily.     fluocinonide (LIDEX) 0.05 % external solution Apply topically.     fluticasone (FLONASE) 50 MCG/ACT nasal spray      furosemide (LASIX) 20 MG  tablet Take 20 mg by mouth 3 (three) times daily. Patient states she takes 2 tablets in the morning and 1 tablet at 2pm each day.     hyoscyamine (LEVSIN SL) 0.125 MG SL tablet      ketoconazole (NIZORAL) 2 % cream SMARTSIG:1 Topical Daily PRN     losartan-hydrochlorothiazide (HYZAAR) 100-12.5 MG per tablet Take 12.5 tablets by mouth daily.     montelukast (SINGULAIR) 10 MG tablet Take 10 mg by mouth daily.     Multiple Vitamins-Minerals (PRESERVISION/LUTEIN) CAPS Take 2 capsules by mouth daily.     Omega-3 Fatty Acids (FISH OIL) 1200 MG CAPS Take 2 capsules by mouth daily.     Polyethyl Glycol-Propyl Glycol 0.4-0.3 % SOLN Apply to eye.       potassium chloride (KLOR-CON) 10 MEQ tablet Take 10 mEq by mouth daily. (Patient not taking: Reported on 09/10/2022)     Probiotic Product (PROBIOTIC PO) Take 1 Dose by mouth daily.     sodium chloride (OCEAN) 0.65 % nasal spray Place 1 spray into the nose as needed for congestion.     triamcinolone cream (KENALOG) 0.1 % SMARTSIG:1 Application Topical 2-3 Times Daily     No facility-administered medications prior to visit.    PAST MEDICAL HISTORY: Past Medical History:  Diagnosis Date   Arthritis    Atrophic vaginitis    Basal cell carcinoma    Cervical dysplasia    Complication of anesthesia    pseudocholinesterase deficiency-hard to wake up   Falls    GERD (gastroesophageal reflux disease)    Hypertension    IBS (irritable bowel syndrome)    Jaw atrophy    Limb-girdle muscular dystrophy ([place]) 07/05/2013   Macular hole of left eye    Muscular dystrophy ([place])    Osteoporosis    pelvic fracture   Pseudocholinesterase deficiency    Scoliosis     PAST SURGICAL HISTORY: Past Surgical History:  Procedure Laterality Date   ABDOMINAL HYSTERECTOMY     TAH BSO   BREAST LUMPECTOMY  1971   benign   CESAREAN SECTION     x2   COLPOSCOPY     EYE SURGERY  2003   left   TOTAL ABDOMINAL HYSTERECTOMY W/  BILATERAL SALPINGOOPHORECTOMY  1981    TRIGGER FINGER RELEASE  07/28/2012   Procedure: RELEASE TRIGGER FINGER/A-1 PULLEY;  Surgeon: Cammie Sickle., MD;  Location: [facility];  Service: Orthopedics;  Laterality: Right;  EXCISION CYST RIGHT LONG A-1 RELEASE A-1 RIGHT LONG     FAMILY HISTORY: Family History  Problem Relation Age of Onset   Hypertension Sister     SOCIAL HISTORY: Social History   Socioeconomic History   Marital status: Divorced    Spouse name: Not on file   Number of children: 3   Years of education: 13   Highest education level: Not on file  Occupational History   Occupation: retired    Comment: Theatre stage manager at [employer]  Tobacco Use   Smoking status: Never   Smokeless tobacco: Never  Vaping Use   Vaping Use: Never used  Substance and Sexual Activity   Alcohol use: No    Alcohol/week: 0.0 standard drinks of alcohol    Comment: rare   Drug use: No   Sexual activity: Never    Birth control/protection: Surgical  Other Topics Concern   Not on file  Social History Narrative   Patient lives at home alone and she is divorced. Patient is retired.   Right handed.   Caffeine- one cup daily.   Uses walker.   Grown children, live 7 miles away.     Social Determinants of Health   Financial Resource Strain: Not on file  Food Insecurity: Not on file  Transportation Needs: Not on file  Physical Activity: Not on file  Stress: Not on file  Social Connections: Not on file  Intimate Partner Violence: Not on file      PHYSICAL EXAM  There were no vitals filed for this visit.   There is no height or weight on file to calculate BMI.  Generalized: Well developed, in no acute distress  Cardiology: normal rate and rhythm, no murmur noted Respiratory: clear to auscultation bilaterally  Neurological examination  Mentation: Alert oriented to time, place, history taking. Follows all commands speech and language fluent Cranial nerve II-XII: Pupils were equal round reactive to  light. Extraocular movements were full, visual field were full on confrontational test. Facial sensation and strength were normal. Uvula tongue midline. Head turning and shoulder shrug  were normal and symmetric. Motor: The motor testing reveals 5 over 5 strength of bilateral extremities, 4/5 bilateral lowers. Good symmetric motor tone is noted throughout.  Sensory: Sensory testing is intact to soft touch on all 4 extremities. No evidence of extinction is noted.  Coordination: Cerebellar testing reveals good finger-nose-finger  Gait and station: Gait is fairly steady with Rolator, able to push with arms to standing position, Tandem not attempted.  Reflexes: Deep tendon reflexes are brisk but symmetric bilaterally.   DIAGNOSTIC DATA (LABS, IMAGING, TESTING) - I reviewed patient records, labs, notes, testing and imaging myself where available.      No data to display           Lab Results  Component Value Date   HGB 14.0 07/28/2012      Component Value Date/Time   NA 137 08/28/2012 1249   K 3.6 08/28/2012 1249   CL 102 08/28/2012 1249   CO2 27 08/28/2012 1249   GLUCOSE 115 (H) 08/28/2012 1249   BUN 21 08/28/2012 1249   CREATININE 0.76 08/28/2012 1249   CALCIUM 10.1 08/28/2012 1249   CALCIUM 10.1 08/28/2012 1249   PROT 6.8 08/28/2012 1249  ALBUMIN 4.3 08/28/2012 1249   AST 34 08/28/2012 1249   ALT 35 08/28/2012 1249   ALKPHOS 41 08/28/2012 1249   BILITOT 0.4 08/28/2012 1249   GFRNONAA 82 (L) 07/27/2012 1100   GFRAA >90 07/27/2012 1100   No results found for: "CHOL", "HDL", "LDLCALC", "LDLDIRECT", "TRIG", "CHOLHDL" No results found for: "HGBA1C" No results found for: "VITAMINB12" No results found for: "TSH"     ASSESSMENT AND PLAN 89 y.o. year old female  has a past medical history of Arthritis, Atrophic vaginitis, Basal cell carcinoma, Cervical dysplasia, Complication of anesthesia, Falls, GERD (gastroesophageal reflux disease), Hypertension, IBS (irritable bowel  syndrome), Jaw atrophy, Limb-girdle muscular dystrophy ([place]) (07/05/2013), Macular hole of left eye, Muscular dystrophy ([place]), Osteoporosis, Pseudocholinesterase deficiency, and Scoliosis. here with   No diagnosis found.    Brandon is doing fairly well, today. She denies new or worsening symptoms. Gait is stable with Rolator. No falls. We have discussed referral to PT. It has not been particularly helpful in the past, however, I feel this may be beneficial for strength training as well as creating a schedule for her to be more active. She denies significant depression. She does have several family members and friends that continue to care for her. She was encouraged to continue healthy lifestyle habits. Fall and safety precautions discussed. She will follow up regularly with care team as directed. She will follow up with Dr Catherine Harding in 6 months, sooner if needed.   No orders of the defined types were placed in this encounter.     No orders of the defined types were placed in this encounter.       Debbora Presto, FNP-C 12/28/2022, 9:48 AM [facility] [address] [place], [place] [address] [phone]

## 2022-12-28 NOTE — Patient Instructions (Incomplete)
Below is our plan:  We will continue to monitor.   Please make sure you are staying well hydrated. I recommend 50-60 ounces daily. Well balanced diet and regular exercise encouraged. Consistent sleep schedule with 6-8 hours recommended.   Please continue follow up with care team as directed.   Follow up with Dr Vickey Huger in 1 year   You may receive a survey regarding today's visit. I encourage you to leave honest feed back as I do use this information to improve patient care. Thank you for seeing me today!   Management of Memory Problems   There are some general things you can do to help manage your memory problems.  Your memory may not in fact recover, but by using techniques and strategies you will be able to manage your memory difficulties better.   1)  Establish a routine. Try to establish and then stick to a regular routine.  By doing this, you will get used to what to expect and you will reduce the need to rely on your memory.  Also, try to do things at the same time of day, such as taking your medication or checking your calendar first thing in the morning. Think about think that you can do as a part of a regular routine and make a list.  Then enter them into a daily planner to remind you.  This will help you establish a routine.   2)  Organize your environment. Organize your environment so that it is uncluttered.  Decrease visual stimulation.  Place everyday items such as keys or cell phone in the same place every day (ie.  Basket next to front door) Use post it notes with a brief message to yourself (ie. Turn off light, lock the door) Use labels to indicate where things go (ie. Which cupboards are for food, dishes, etc.) Keep a notepad and pen by the telephone to take messages   3)  Memory Aids A diary or journal/notebook/daily planner Making a list (shopping list, chore list, to do list that needs to be done) Using an alarm as a reminder (kitchen timer or cell phone  alarm) Using cell phone to store information (Notes, Calendar, Reminders) Calendar/White board placed in a prominent position Post-it notes   In order for memory aids to be useful, you need to have good habits.  It's no good remembering to make a note in your journal if you don't remember to look in it.  Try setting aside a certain time of day to look in journal.   4)  Improving mood and managing fatigue. There may be other factors that contribute to memory difficulties.  Factors, such as anxiety, depression and tiredness can affect memory. Regular gentle exercise can help improve your mood and give you more energy. Exercise: there are short videos created by the [employer] on Health specially for older adults: https://bit.ly/2I30q97.  Mediterranean diet: which emphasizes fruits, vegetables, whole grains, legumes, fish, and other seafood; unsaturated fats such as olive oils; and low amounts of red meat, eggs, and sweets. A variation of this, called MIND (Mediterranean-DASH Intervention for Neurodegenerative Delay) incorporates the DASH (Dietary Approaches to Stop Hypertension) diet, which has been shown to lower high blood pressure, a risk factor for Alzheimer's disease. More information at: ExitMarketing.de.  Aerobic exercise that improve heart health is also good for the mind.  [employer] on Aging have short videos for exercises that you can do at home: BlindWorkshop.com.pt Simple relaxation techniques may help relieve symptoms of  anxiety Try to get back to completing activities or hobbies you enjoyed doing in the past. Learn to pace yourself through activities to decrease fatigue. Find out about some local support groups where you can share experiences with others. Try and achieve 7-8 hours of sleep at night.   Resources for Family/Caregiver  Online caregiver support groups can be found at WesternTunes.it  or call Alzheimer's Association's 24/7 hotline: [phone]. [facility] Memory Counseling Program offers in-person, virtual support groups and individual counseling for both care partners and persons with memory loss. Call for more information at [phone].   Advanced care plan: there are two types of Power of Attorney: healthcare and durable. Healthcare POA is a designated person to make healthcare decisions on your behalf if you were too sick to make them yourself. This person can be selected and documented by your physician. Durable POA has to be set up with a lawyer who takes charge of your finances and estate if you were too sick or cognitively impaired to manage your finances accurately. You can find a local Elder Therapist, art here: [place]Ranch.at.  Check out www.planyourlifespan.org, which will help you plan before a crisis and decide who will take care of life considerations in a circumstance where you may not be able to speak for yourself.   Helpful books (available on [employer] or your local bookstore):  By Dr. Carl Best: Keeping Love Alive as Memories Fade: The 5 Love Languages and the Alzheimer's Journey Jul 12, 2015 The Dementia Care Partner's Workbook: A Guide for Understanding, Education, and [employer] - March 11, 2018.  Both available for less than $15.   "Coping with behavior change in dementia: a family caregiver's guide" by Ricardo Jericho & Valora Piccolo "A Caregiver's Guide to Dementia: Using Activities and Other Strategies to Prevent, Reduce and Manage Behavioral Symptoms" by Mahala Menghini Gitlin and [employer].  Youth worker of Joy for the Person with Alzheimer's or Dementia" 4th edition by Tama High  Caregiver videos on common behaviors related to dementia: PopulationGame.pl  [place] Caregiver Portal: free to sign up, links to local resources: https://[place]-caregivers.com/login

## 2022-12-30 ENCOUNTER — Encounter: Payer: Self-pay | Admitting: Family Medicine

## 2022-12-30 ENCOUNTER — Ambulatory Visit (INDEPENDENT_AMBULATORY_CARE_PROVIDER_SITE_OTHER): Payer: PPO | Admitting: Family Medicine

## 2022-12-30 VITALS — BP 156/69 | HR 72 | Ht 62.0 in | Wt 156.2 lb

## 2022-12-30 DIAGNOSIS — R29898 Other symptoms and signs involving the musculoskeletal system: Secondary | ICD-10-CM | POA: Diagnosis not present

## 2022-12-30 DIAGNOSIS — G71039 Limb girdle muscular dystrophy, unspecified: Secondary | ICD-10-CM | POA: Diagnosis not present

## 2022-12-30 DIAGNOSIS — H3530 Unspecified macular degeneration: Secondary | ICD-10-CM | POA: Diagnosis not present

## 2022-12-30 DIAGNOSIS — R269 Unspecified abnormalities of gait and mobility: Secondary | ICD-10-CM

## 2023-01-06 DIAGNOSIS — H353221 Exudative age-related macular degeneration, left eye, with active choroidal neovascularization: Secondary | ICD-10-CM | POA: Diagnosis not present

## 2023-01-13 DIAGNOSIS — K224 Dyskinesia of esophagus: Secondary | ICD-10-CM | POA: Diagnosis not present

## 2023-01-13 DIAGNOSIS — K589 Irritable bowel syndrome without diarrhea: Secondary | ICD-10-CM | POA: Diagnosis not present

## 2023-01-13 DIAGNOSIS — K219 Gastro-esophageal reflux disease without esophagitis: Secondary | ICD-10-CM | POA: Diagnosis not present

## 2023-02-23 DIAGNOSIS — M25561 Pain in right knee: Secondary | ICD-10-CM | POA: Diagnosis not present

## 2023-02-23 DIAGNOSIS — M25551 Pain in right hip: Secondary | ICD-10-CM | POA: Diagnosis not present

## 2023-03-02 DIAGNOSIS — M25561 Pain in right knee: Secondary | ICD-10-CM | POA: Diagnosis not present

## 2023-03-15 NOTE — Progress Notes (Signed)
Celso Amy, PA-C [address]  [address]  [place], [place] [address]  Main: [phone]  Fax: [phone]   Gastroenterology Consultation  Referring Provider:     Adrian Prince, MD Primary Care Physician:  Adrian Prince, MD Primary Gastroenterologist:  Celso Amy, PA-C / Dr. Wyline Mood  Reason for Consultation:     Irritable bowel syndrome with diarrhea        HPI:   Catherine Harding is a 89 y.o. y/o female referred for consultation & management  by Adrian Prince, MD.    Patient has long history of irritable bowel syndrome with chronic intermittent diarrhea.  Previous patient of [facility], transferring to our office.  We are requesting all GI records.  Currently her diarrhea is better.  Not having any recent diarrhea.  She denies nausea, vomiting, fever, chills, constipation or rectal bleeding.  For the past 1 to 2 weeks she has had severe right lower quadrant pain.  Pain is 8 out of 10 today.  She is worried about diverticulitis.  She has tried hyoscyamine with little benefit.  She has had previous hysterectomy, appendectomy, and C-section.  Last CT scan in 2018 showed no acute abnormality.  Moderate pandiverticulosis.  No recent lab work.  She reports worsening of her muscular dystrophy with worsening leg weakness.  Walks with a walker.  She is here today with a friend who is her transportation.  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   Macular hole of left eye    Muscular dystrophy (HCC)    Osteoporosis    pelvic fracture   Pseudocholinesterase deficiency    Scoliosis     Past Surgical History:  Procedure Laterality Date    ABDOMINAL HYSTERECTOMY     TAH BSO   BREAST LUMPECTOMY  1971   benign   CESAREAN SECTION     x2   COLPOSCOPY     EYE SURGERY  2003   left   TOTAL ABDOMINAL HYSTERECTOMY W/ BILATERAL SALPINGOOPHORECTOMY  1981   TRIGGER FINGER RELEASE  07/28/2012   Procedure: RELEASE TRIGGER FINGER/A-1 PULLEY;  Surgeon: Wyn Forster., MD;  Location: [facility];  Service: Orthopedics;  Laterality: Right;  EXCISION CYST RIGHT LONG A-1 RELEASE A-1 RIGHT LONG     Prior to Admission medications   Medication Sig Start Date End Date Taking? Authorizing Provider  Artificial Tear Solution (SYSTANE CONTACTS) SOLN Apply to eye.    [provider]  bacitracin ophthalmic ointment Place 500 application into both eyes 3 (three) times daily. 04/19/13   [provider]  Calcium Carbonate Antacid (TUMS PO) Take by mouth daily as needed. chewable    [provider]  Calcium Carbonate-Vit D-Min (CALTRATE PLUS PO) Take by mouth.      [provider]  denosumab (PROLIA) 60 MG/ML SOLN injection Inject 60 mg into the skin every 6 (six) months. Administer in upper arm, thigh, or abdomen    [provider]  diltiazem (CARDIZEM CD) 240 MG 24 hr capsule Take 240 mg by mouth daily.      [provider]  ergocalciferol (VITAMIN D2) 50000 UNITS capsule Take 50,000 Units by mouth 2 (two) times a week.     [provider]  famotidine (PEPCID) 20 MG tablet Take 20 mg by mouth 2 (two) times daily.    [provider]  fluocinonide (LIDEX) 0.05 % external solution Apply topically. 07/09/21   [provider]  fluticasone Aleda Grana) 50 MCG/ACT nasal spray  01/08/20   [provider]  furosemide (LASIX) 20 MG tablet Take 20 mg by mouth 3 (three) times daily. Patient states she takes 2 tablets in the morning and 1 tablet at 2pm each day.    [provider]  hyoscyamine (LEVSIN SL) 0.125 MG SL tablet  06/12/19   [provider]   ketoconazole (NIZORAL) 2 % cream SMARTSIG:1 Topical Daily PRN 07/09/21   [provider]  losartan-hydrochlorothiazide (HYZAAR) 100-12.5 MG per tablet Take 12.5 tablets by mouth daily. 06/12/13   [provider]  montelukast (SINGULAIR) 10 MG tablet Take 10 mg by mouth daily. 06/21/21   [provider]  Multiple Vitamins-Minerals (PRESERVISION/LUTEIN) CAPS Take 2 capsules by mouth daily.    [provider]  Omega-3 Fatty Acids (FISH OIL) 1200 MG CAPS Take 2 capsules by mouth daily.    [provider]  Polyethyl Glycol-Propyl Glycol 0.4-0.3 % SOLN Apply to eye.      [provider]  potassium chloride (KLOR-CON) 10 MEQ tablet Take 10 mEq by mouth daily. 01/19/20   [provider]  Probiotic Product (PROBIOTIC PO) Take 1 Dose by mouth daily.    [provider]  sodium chloride (OCEAN) 0.65 % nasal spray Place 1 spray into the nose as needed for congestion.    [provider]  triamcinolone cream (KENALOG) 0.1 % SMARTSIG:1 Application Topical 2-3 Times Daily 01/08/20   [provider]    Family History  Problem Relation Age of Onset   Hypertension Sister      Social History   Tobacco Use   Smoking status: Never   Smokeless tobacco: Never  Vaping Use   Vaping Use: Never used  Substance Use Topics   Alcohol use: No    Alcohol/week: 0.0 standard drinks of alcohol    Comment: rare   Drug use: No    Allergies as of 03/16/2023 - Review Complete 03/16/2023  Allergen Reaction Noted   Anesthetics, amide  07/04/2013   Celebrex [celecoxib]  09/08/2011   Ciprofloxacin Swelling 09/08/2011   Codeine Nausea And Vomiting 09/08/2011   Morphine and codeine Nausea And Vomiting 09/08/2011   Sulfa antibiotics  09/08/2011    Review of Systems:    All systems reviewed and negative except where noted in HPI.   Physical Exam:  BP 117/75   Pulse (!) 108   Temp 98.4 F (36.9 C) (Oral)   Ht 5\' 1"  (1.549 m)   Wt  150 lb 8 oz (68.3 kg)   BMI 28.44 kg/m  No LMP recorded. Patient has had a hysterectomy. Psych:  Alert and cooperative.  Anxious mood and affect. General:   Feeble, elderly female, walks slowly with a walker.  Pleasant and cooperative in NAD Head:  Normocephalic and atraumatic. Eyes:  Sclera clear, no icterus.   Conjunctiva pink. Neck:  Supple; no masses or thyromegaly. Lungs:  Respirations even and unlabored.  Clear throughout to auscultation.   No wheezes, crackles, or rhonchi. No acute distress. Heart:  Regular rate and rhythm; no murmurs, clicks, rubs, or gallops. Abdomen:  Normal bowel sounds.  No bruits.  Soft, and non-distended without masses, hepatosplenomegaly or hernias noted.  Moderate RLQ tenderness; rest of abdomen is not tender.  No guarding or rebound tenderness.    Neurologic:  Alert and oriented x3; moderate lower extremity weakness.  Has difficulty getting on and off exam table with my help. Psych:  Alert and cooperative.  Anxious mood and affect.  Imaging Studies: No results found.  Assessment and Plan:   Catherine Harding is a 89 y.o. y/o female has been referred for irritable bowel syndrome with chronic intermittent diarrhea.  She is having severe right lower quadrant pain for 1 or 2 weeks.  Rule out diverticulitis.  Previous hysterectomy, appendectomy, and C-section.  History of diverticulosis.  I am ordering lab work and a CT abdomen pelvis with contrast for further evaluation.  RLQ abdominal pain  Lab: CBC, CMP  Scheduling CT abdomen pelvis with contrast  Recommend soft foods, low fiber and liquid diet until pain resolves.  I advised patient to go to the ED if her pain worsens.  She expressed understanding.  Irritable bowel syndrome with diarrhea  Continue current treatment  Request GI records from [facility] gastro[facility]ology in [place].  Follow up in 4 weeks with TG.  Celso Amy, PA-C

## 2023-03-16 ENCOUNTER — Encounter: Payer: Self-pay | Admitting: Physician Assistant

## 2023-03-16 ENCOUNTER — Ambulatory Visit (INDEPENDENT_AMBULATORY_CARE_PROVIDER_SITE_OTHER): Payer: PPO | Admitting: Physician Assistant

## 2023-03-16 VITALS — BP 117/75 | HR 108 | Temp 98.4°F | Ht 61.0 in | Wt 150.5 lb

## 2023-03-16 DIAGNOSIS — K580 Irritable bowel syndrome with diarrhea: Secondary | ICD-10-CM

## 2023-03-16 DIAGNOSIS — R1031 Right lower quadrant pain: Secondary | ICD-10-CM | POA: Diagnosis not present

## 2023-03-17 ENCOUNTER — Telehealth: Payer: Self-pay

## 2023-03-17 DIAGNOSIS — H353221 Exudative age-related macular degeneration, left eye, with active choroidal neovascularization: Secondary | ICD-10-CM | POA: Diagnosis not present

## 2023-03-17 LAB — CBC WITH DIFFERENTIAL
Basophils Absolute: 0 10*3/uL (ref 0.0–0.2)
Basos: 0 %
EOS (ABSOLUTE): 0 10*3/uL (ref 0.0–0.4)
Eos: 0 %
Hematocrit: 40.4 % (ref 34.0–46.6)
Hemoglobin: 13.6 g/dL (ref 11.1–15.9)
Immature Grans (Abs): 0 10*3/uL (ref 0.0–0.1)
Immature Granulocytes: 0 %
Lymphocytes Absolute: 2.6 10*3/uL (ref 0.7–3.1)
Lymphs: 26 %
MCH: 32.5 pg (ref 26.6–33.0)
MCHC: 33.7 g/dL (ref 31.5–35.7)
MCV: 96 fL (ref 79–97)
Monocytes Absolute: 1.3 10*3/uL — ABNORMAL HIGH (ref 0.1–0.9)
Monocytes: 13 %
Neutrophils Absolute: 6.1 10*3/uL (ref 1.4–7.0)
Neutrophils: 61 %
RBC: 4.19 x10E6/uL (ref 3.77–5.28)
RDW: 12.5 % (ref 11.7–15.4)
WBC: 10.1 10*3/uL (ref 3.4–10.8)

## 2023-03-17 LAB — COMPREHENSIVE METABOLIC PANEL
ALT: 28 IU/L (ref 0–32)
AST: 21 IU/L (ref 0–40)
Albumin/Globulin Ratio: 1.6 (ref 1.2–2.2)
Albumin: 4.1 g/dL (ref 3.6–4.6)
Alkaline Phosphatase: 68 IU/L (ref 44–121)
BUN/Creatinine Ratio: 17 (ref 12–28)
BUN: 16 mg/dL (ref 10–36)
Bilirubin Total: 0.6 mg/dL (ref 0.0–1.2)
CO2: 23 mmol/L (ref 20–29)
Calcium: 9.9 mg/dL (ref 8.7–10.3)
Chloride: 99 mmol/L (ref 96–106)
Creatinine, Ser: 0.94 mg/dL (ref 0.57–1.00)
Globulin, Total: 2.5 g/dL (ref 1.5–4.5)
Glucose: 159 mg/dL — ABNORMAL HIGH (ref 70–99)
Potassium: 4.2 mmol/L (ref 3.5–5.2)
Sodium: 139 mmol/L (ref 134–144)
Total Protein: 6.6 g/dL (ref 6.0–8.5)
eGFR: 58 mL/min/{1.73_m2} — ABNORMAL LOW (ref >59)

## 2023-03-17 NOTE — Telephone Encounter (Signed)
Patient notified.  Notify patient her CBC and CMP are normal.  White count, hemoglobin, kidney and liver test are normal.  Continue current plan for abd / pelvic CT with contrast.

## 2023-03-17 NOTE — Progress Notes (Signed)
Notify patient her CBC and CMP are normal.  White count, hemoglobin, kidney and liver test are normal.  Continue current plan for abd / pelvic CT with contrast.

## 2023-03-17 NOTE — Telephone Encounter (Signed)
Left message for patient to return call to office regarding lab results.  Notify patient her CBC and CMP are normal.  White count, hemoglobin, kidney and liver test are normal.  Continue current plan for abd / pelvic CT with contrast.

## 2023-03-23 ENCOUNTER — Ambulatory Visit
Admission: RE | Admit: 2023-03-23 | Discharge: 2023-03-23 | Disposition: A | Discharge status: Home / Self Care | Payer: PPO | Source: Ambulatory Visit | Attending: Physician Assistant | Admitting: Physician Assistant

## 2023-03-23 DIAGNOSIS — R1031 Right lower quadrant pain: Secondary | ICD-10-CM | POA: Insufficient documentation

## 2023-03-23 DIAGNOSIS — I700 Atherosclerosis of aorta: Secondary | ICD-10-CM | POA: Diagnosis not present

## 2023-03-23 DIAGNOSIS — R109 Unspecified abdominal pain: Secondary | ICD-10-CM | POA: Diagnosis not present

## 2023-03-23 DIAGNOSIS — K760 Fatty (change of) liver, not elsewhere classified: Secondary | ICD-10-CM | POA: Diagnosis not present

## 2023-03-23 MED ORDER — IOHEXOL 9 MG/ML PO SOLN: 500.0000 mL | ORAL | Status: AC

## 2023-03-23 MED ORDER — IOHEXOL 9 MG/ML PO SOLN
500.0000 mL | ORAL | Status: AC
  Administered 2023-03-23 (×2): 500 mL via ORAL

## 2023-03-23 MED ORDER — IOHEXOL 300 MG/ML  SOLN
100.0000 mL | Freq: Once | INTRAMUSCULAR | Status: AC | PRN
  Administered 2023-03-23: 100 mL via INTRAVENOUS

## 2023-03-24 ENCOUNTER — Telehealth: Payer: Self-pay | Admitting: Physician Assistant

## 2023-03-24 NOTE — Telephone Encounter (Signed)
Pt  left message to get results of CT scan on 03/23/2023 please return call

## 2023-03-25 ENCOUNTER — Telehealth: Payer: Self-pay

## 2023-03-25 MED ORDER — AMOXICILLIN-POT CLAVULANATE 875-125 MG PO TABS: 1.0000 | ORAL_TABLET | Freq: Two times a day (BID) | ORAL | 0 refills | Status: DC

## 2023-03-25 NOTE — Progress Notes (Signed)
Call and notify patient abdominal pelvic CT shows mild early sigmoid diverticulitis.  No abscess or perforation.  No other acute abnormality.  Incidental mild fatty liver, small amount of gallstones, tiny right kidney stone versus calcification.  Not worrisome.  I recommend treat mild acute diverticulitis with Augmentin 875 mg twice daily for 10 days, #20, no refills.

## 2023-03-25 NOTE — Telephone Encounter (Signed)
Spoke with patient- Augmentin sent to pharmacy. Will mail patient copy of results of Ct scan. Call and notify patient abdominal pelvic CT shows mild early sigmoid diverticulitis.  No abscess or perforation.  No other acute abnormality.  Incidental mild fatty liver, small amount of gallstones, tiny right kidney stone versus calcification.  Not worrisome.  I recommend treat mild acute diverticulitis with Augmentin 875 mg twice daily for 10 days, #20, no refills.

## 2023-03-25 NOTE — Telephone Encounter (Signed)
error 

## 2023-03-29 ENCOUNTER — Other Ambulatory Visit (HOSPITAL_COMMUNITY): Payer: Self-pay | Admitting: *Deleted

## 2023-03-31 ENCOUNTER — Ambulatory Visit (HOSPITAL_COMMUNITY)
Admission: RE | Admit: 2023-03-31 | Discharge: 2023-03-31 | Disposition: A | Discharge status: Home / Self Care | Payer: PPO | Source: Ambulatory Visit | Attending: Endocrinology | Admitting: Endocrinology

## 2023-03-31 DIAGNOSIS — M810 Age-related osteoporosis without current pathological fracture: Secondary | ICD-10-CM | POA: Insufficient documentation

## 2023-03-31 MED ORDER — DENOSUMAB 60 MG/ML ~~LOC~~ SOSY
PREFILLED_SYRINGE | SUBCUTANEOUS | Status: AC
  Filled 2023-03-31: qty 1

## 2023-03-31 MED ORDER — DENOSUMAB 60 MG/ML ~~LOC~~ SOSY
60.0000 mg | PREFILLED_SYRINGE | Freq: Once | SUBCUTANEOUS | Status: AC
  Administered 2023-03-31: 60 mg via SUBCUTANEOUS

## 2023-04-20 ENCOUNTER — Telehealth: Payer: Self-pay | Admitting: Family Medicine

## 2023-04-20 ENCOUNTER — Telehealth: Payer: Self-pay

## 2023-04-20 DIAGNOSIS — G71039 Limb girdle muscular dystrophy, unspecified: Secondary | ICD-10-CM

## 2023-04-20 NOTE — Telephone Encounter (Signed)
Called pt and she stated that she has been in a wheelchair since 1 week after Mother's Day. Pt states that her legs are so weak that she couldn't walk nor get up so her Husband brought her a wheelchair. Asked pt if she would like to have some Physical Therapy to strengthen  her legs and she stated that she feels weaker after PT and that she wants to be prescribed something for strength.

## 2023-04-20 NOTE — Addendum Note (Signed)
Addended by: Shawnie Dapper L on: 04/20/2023 02:42 PM   Modules accepted: Orders

## 2023-04-20 NOTE — Telephone Encounter (Signed)
Patient left a voicemail stating she would like a refill on the antibiotic that was given to her for abdominal pain. She states the pain went away while on the medication and for a 1 week after taking the medication. She states since then the pain has return and would like to take the medication again.  Patient was given Augmentin

## 2023-04-20 NOTE — Telephone Encounter (Signed)
Called pt and offered the referral for PT Per Shawnie Dapper, NP. Pt was reluctant at first but she got on board and is now wanting the referral for Physical Therapy. Pt stated "I am determined to walk again and at this point I'll do anything" . Told pt that Amy will notified about this. Pt verbalized understanding.

## 2023-04-20 NOTE — Telephone Encounter (Signed)
Pt reports she is now using a wheelchair, pt asking if there is anything that can be prescribed to help strengthen her legs, please call to discuss

## 2023-04-21 MED ORDER — AMOXICILLIN-POT CLAVULANATE 875-125 MG PO TABS: 1.0000 | ORAL_TABLET | Freq: Two times a day (BID) | ORAL | 0 refills | Status: AC

## 2023-04-21 NOTE — Telephone Encounter (Signed)
Sent medication to [facility] and informed patient of this information and she verbalized understanding

## 2023-04-25 NOTE — Telephone Encounter (Signed)
 Noted  

## 2023-04-26 NOTE — Telephone Encounter (Signed)
Amy patient said she wanted home health PT, I have the referral open in this encounter. Can you fill in you fill in the blanks.

## 2023-04-26 NOTE — Addendum Note (Signed)
Addended by: Aura Camps on: 04/26/2023 04:47 PM   Modules accepted: Orders

## 2023-04-26 NOTE — Telephone Encounter (Signed)
The patient wants at home PT can you please place a home health referral for PT

## 2023-04-27 NOTE — Telephone Encounter (Signed)
Pt scheduled appt on 05/04/23 at 3:00 pm

## 2023-04-27 NOTE — Telephone Encounter (Addendum)
Phone room please call and let her know we must see her for face to face visit to order home health. Please schedule her a visit.      Per Amy response " Please check but I think we will have to have a face to face visit within a period of time to order home health. I think we will have to see her before orders can be placed. I am happy to order if I am wrong. TY

## 2023-05-03 NOTE — Patient Instructions (Signed)

## 2023-05-03 NOTE — Progress Notes (Unsigned)
[name] DOB: 05/19/1930  REASON FOR VISIT: follow up HISTORY FROM: patient  No chief complaint on file.    HISTORY OF PRESENT ILLNESS:  05/03/23 ALL: Catherine Harding returns for follow up for muscular dystrophy. She was last seen 12/2022 and doing well. Unfortunately, she had increasing weakness 02/2023. She had been using a wheelchair more often. PT advised but she wished to have in home PT. Face to face visit required to place [facility] orders.   12/30/2022 ALL: Catherine Harding returns for follow up for muscular dystrophy. She was last seen by Dr Vickey Huger 12/2021. She reports doing fairly well, overall. She did have a fall in December. She reports having sciatica at the time and as she lifted her leg to get out of the tub, she had a horrible pain and felt her leg give out. No injuries. She had to have her daughter come and get her up form the floor. No other falls. She continues to use her Rolator. She has not seen [facility] in years. She feels that she is doing well. She does not drive much at all. She drives short local distances only. A friend helps with longer distance driving. She lives alone. She is fully independent. She is managing her medications without difficulty.   Her daughter was recently diagnosed with AD at age 44. No maternal history of dementia. Father had vascular dementia.   06/23/2021 ALL: Catherine Harding returns for follow up. She feels that she is doing fairly well. No significant changes. She continues to have bilateral lower extremity weakness and low back/hip discomfort. She has numbness of her toes, bilaterally, but is not overly bothered by this. She is using her Rolator or walker at all times. She continues to live alone. She is able to maintain her home but has difficulty cleaning it. She is not able to stand up for longer than about 5 minutes at a time. She can perform ADLs independently. She manages medicaitons and finances. Her grandson mows her yard and her nephew calls every Sunday to  check in. She has a neighbor that helps with driving to ophthalmology for eye injections. She can drive short, local distances if she has to. No falls. She has lost her sister at age 13, two nephews this year. She lost her church friend and significant other in the past 2-3 years. She is no longer gong to church.   06/23/2020 ALL:  Catherine Harding is a 89 y.o. female here today for follow up. She feels that increased swelling of bilateral legs have decreased her mobility. She continues to live alone. She is able to perform ADL's independently. She can not get up stairs. She continues to use her Rolator. She has great support with her family. She has family that helps with lawn work. She someone helping her with cleaning her home. She is no longer driving long distances due to vision changes. She is followed by ophthalmology regularly. She denies any falls. She feels that she is doing failry well, overall. She is followed closely by PCP.   HISTORY: (copied from Dr Dohmeier's note on 06/20/2019)  I have the pleasure of seeing Catherine Harding today meanwhile 89 year old Caucasian right-handed lady that is still able to ambulate but not without assistance.  She is now using a walker or similar frame.  She has been followed in this office for the last 14 years, and has an adult onset limb-girdle muscular dystrophy.  She is using the walker even inside her home.  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  In the past visits we often spoke also about abdominal and flank pain but a CT of the abdomen did not confirm diverticulosis. IBS- diarrhea was diagnosed, has been on diuretics also- she has ankle edema. No varicosis.  Her feet are swollen, not weeping.  She can't put compression stocking on without assistance. As soon as she goes to bed she starts Burping. She already excluded onions, peppers and leeks from her diet.  Dr Evlyn Kanner ordered echo and vein examination to no results. She  suffers from hypokalemia, too.  Nobody has given her an explanation why the legs keep swelling, painfully.    Interval history from 06/01/2017, I have the pleasure of seeing Catherine Harding N/A scheduled yearly revisit, her last 2 appointments were with NP Darrol Angel. The patient states that her gait has not further deteriorated, she is using a seated walker, she does have abdominal and flank pain and was thought to suffer from diverticulitis, but his CT of the abdomen has not confirmed the presence of inflammatory changes. She now contracted a urinary tract infection, she has had a difficult summer due to abnormal digestion and infection. She is seeing dr Dulce Sellar [facility] @ [facility] today.  The patient had developed hip pain in the year 2014 and had multiple falls that year, but steroid injections into the hip has helped greatly and she had no recurrence since. Her last fall was last November.  She now uses the walker regularly outside of the home, in home she " wobbles ' from wall to furniture. She has had dizzyspells. Improved after d/c Hyoscyamines.   Catherine Harding,  a 89 year old female returns for follow-up on her limb girdle dystrophy and associated gait disorder. She was last seen by me on 07/05/2013. 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 The scoliosis has led to some stiffness in her lower extremities problems to arise from a chair or from a seated position. The stiffness is worse if she remained seated for a longer period of time. She reports that just driving to town for 30 or 40 minutes would be enough to have problems. She is however still driving, her problem is to enter and leave the car. In 2007 this established patient underwent a EMG and nerve conduction study, which showed only  a mild peripheral neuropathy. Her neurologic symptoms are otherwise unchanged- she has the expected progression of gait difficulties,  she does have a loss of balance /poor  sense of balance and she is walking with a walker to stabilize. She reports using a cane at home. Within the boundaries of her home she can maneuver by holding onto the wall or furniture if needed. She does not use an assistive device per se indoors. She reports sciatic nerve pain. Hip pain- treated with meloxicam and prednisone  But only found relief after sterid injections.    she has had 3 falls in the 2014 , but none in 2015- and all occurred when she was not using her cane outdoors.    HPI _ 2016 ;Patients sister was never diagnosed with Limb Girdle-Muscular Dystrophy, but died wheelchair bound at age 19 in 44. Another sister is still alive at age 60 and in good shape. Lucila Maine is now 22. Neither child nor her grandchild have any signs of muscle atrophy.  The patient has a history of walking " funny " for probably over a decade may  be to. But she was only diagnosed after presenting to the neurology office 9 years ago. There is a family history as her sister was probably affected by the same dystrophy type. None of her children have shown any symptoms at this time and her grandchildren are unaffected also.  Two of her children have  scoliosis , which can be an early symptom. It was her then 4 year old grandson who spotted and named her gait " [place] "  She does report left-sided sciatica and she has a very palpable tender spot at L5 L4. The patella reflex is preserved. She found some relief after an injection. I would like for her to have physical therapy and perhaps massage therapy. Will refer to PT , she had not been seen by neuro rehab.     REVIEW OF SYSTEMS: Out of a complete 14 system review of symptoms, the patient complains only of the following symptoms, chronic pain, gait abnormality, imbalance, and all other reviewed systems are negative.   ALLERGIES: Allergies  Allergen Reactions   Anesthetics, Amide    Celebrex [Celecoxib]    Ciprofloxacin Swelling   Codeine Nausea And  Vomiting   Morphine And Codeine Nausea And Vomiting   Sulfa Antibiotics     HOME MEDICATIONS: Outpatient Medications Prior to Visit  Medication Sig Dispense Refill   Artificial Tear Solution (SYSTANE CONTACTS) SOLN Apply to eye.     bacitracin ophthalmic ointment Place 500 application into both eyes 3 (three) times daily.     Calcium Carbonate Antacid (TUMS PO) Take by mouth daily as needed. chewable     Calcium Carbonate-Vit D-Min (CALTRATE PLUS PO) Take by mouth.       denosumab (PROLIA) 60 MG/ML SOLN injection Inject 60 mg into the skin every 6 (six) months. Administer in upper arm, thigh, or abdomen     diltiazem (CARDIZEM CD) 240 MG 24 hr capsule Take 240 mg by mouth daily.       ergocalciferol (VITAMIN D2) 50000 UNITS capsule Take 50,000 Units by mouth 2 (two) times a week.      famotidine (PEPCID) 20 MG tablet Take 20 mg by mouth 2 (two) times daily.     fluocinonide (LIDEX) 0.05 % external solution Apply topically.     fluticasone (FLONASE) 50 MCG/ACT nasal spray      furosemide (LASIX) 20 MG tablet Take 20 mg by mouth 3 (three) times daily. Patient states she takes 2 tablets in the morning and 1 tablet at 2pm each day.     hyoscyamine (LEVSIN SL) 0.125 MG SL tablet      ketoconazole (NIZORAL) 2 % cream SMARTSIG:1 Topical Daily PRN     losartan-hydrochlorothiazide (HYZAAR) 100-12.5 MG per tablet Take 12.5 tablets by mouth daily.     montelukast (SINGULAIR) 10 MG tablet Take 10 mg by mouth daily.     Multiple Vitamins-Minerals (PRESERVISION/LUTEIN) CAPS Take 2 capsules by mouth daily.     Omega-3 Fatty Acids (FISH OIL) 1200 MG CAPS Take 2 capsules by mouth daily.     Polyethyl Glycol-Propyl Glycol 0.4-0.3 % SOLN Apply to eye.       potassium chloride (KLOR-CON) 10 MEQ tablet Take 10 mEq by mouth daily.     Probiotic Product (PROBIOTIC PO) Take 1 Dose by mouth daily.     sodium chloride (OCEAN) 0.65 % nasal spray Place 1 spray into the nose as needed for congestion.      triamcinolone cream (KENALOG) 0.1 % SMARTSIG:1 Application Topical 2-3 Times  Daily     No facility-administered medications prior to visit.    PAST MEDICAL HISTORY: 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   Macular hole of left eye    Muscular dystrophy (HCC)    Osteoporosis    pelvic fracture   Pseudocholinesterase deficiency    Scoliosis     PAST SURGICAL HISTORY: Past Surgical History:  Procedure Laterality Date   ABDOMINAL HYSTERECTOMY     TAH BSO   BREAST LUMPECTOMY  1971   benign   CESAREAN SECTION     x2   COLPOSCOPY     EYE SURGERY  2003   left   TOTAL ABDOMINAL HYSTERECTOMY W/ BILATERAL SALPINGOOPHORECTOMY  1981   TRIGGER FINGER RELEASE  07/28/2012   Procedure: RELEASE TRIGGER FINGER/A-1 PULLEY;  Surgeon: Wyn Forster., MD;  Location: [facility];  Service: Orthopedics;  Laterality: Right;  EXCISION CYST RIGHT LONG A-1 RELEASE A-1 RIGHT LONG     FAMILY HISTORY: Family History  Problem Relation Age of Onset   Hypertension Sister     SOCIAL HISTORY: Social History   Socioeconomic History   Marital status: Divorced    Spouse name: Not on file   Number of children: 3   Years of education: 13   Highest education level: Not on file  Occupational History   Occupation: retired    Comment: Haematologist at [employer]  Tobacco Use   Smoking status: Never   Smokeless tobacco: Never  Vaping Use   Vaping status: Never Used  Substance and Sexual Activity   Alcohol use: No    Alcohol/week: 0.0 standard drinks of alcohol    Comment: rare   Drug use: No   Sexual activity: Never    Birth control/protection:  Surgical  Other Topics Concern   Not on file  Social History Narrative   Patient lives at home alone and she is divorced. Patient is retired.   Right handed.   Caffeine- one cup daily.   Uses walker.   Grown children, live 7 miles away.     Social Determinants of Health   Financial Resource Strain: Not on file  Food Insecurity: Not on file  Transportation Needs: Not on file  Physical Activity: Not on file  Stress: Not on file  Social Connections: Not on file  Intimate Partner Violence: Not on file      PHYSICAL EXAM  There were no vitals filed for this visit.    There is no height or weight on file to calculate BMI.  Generalized: Well developed, in no acute distress  Cardiology: normal rate and rhythm, no murmur noted Respiratory: clear to auscultation bilaterally  Neurological examination  Mentation: Alert oriented to time, place, history taking. Follows all commands speech and language fluent Cranial nerve II-XII: Pupils were equal round reactive to light. Extraocular movements were full, visual field were full on confrontational test. Facial sensation and strength were normal. Uvula tongue midline. Head turning and shoulder shrug  were normal and symmetric. Motor: The motor testing reveals 5 over 5 strength of bilateral extremities, 4/5 bilateral lowers. Good symmetric motor  tone is noted throughout.  Sensory: Sensory testing is intact to soft touch on all 4 extremities. No evidence of extinction is noted.  Coordination: Cerebellar testing reveals good finger-nose-finger  Gait and station: Gait is fairly steady with Rolator, able to push with arms to standing position, Tandem not attempted.  Reflexes: Deep tendon reflexes are brisk but symmetric bilaterally.   DIAGNOSTIC DATA (LABS, IMAGING, TESTING) - I reviewed patient records, labs, notes, testing and imaging myself where available.      No data to display           Lab Results  Component Value Date   WBC  10.1 03/16/2023   HGB 13.6 03/16/2023   HCT 40.4 03/16/2023   MCV 96 03/16/2023      Component Value Date/Time   NA 139 03/16/2023 1340   K 4.2 03/16/2023 1340   CL 99 03/16/2023 1340   CO2 23 03/16/2023 1340   GLUCOSE 159 (H) 03/16/2023 1340   GLUCOSE 115 (H) 08/28/2012 1249   BUN 16 03/16/2023 1340   CREATININE 0.94 03/16/2023 1340   CREATININE 0.76 08/28/2012 1249   CALCIUM 9.9 03/16/2023 1340   CALCIUM 10.1 08/28/2012 1249   PROT 6.6 03/16/2023 1340   ALBUMIN 4.1 03/16/2023 1340   AST 21 03/16/2023 1340   ALT 28 03/16/2023 1340   ALKPHOS 68 03/16/2023 1340   BILITOT 0.6 03/16/2023 1340   GFRNONAA 82 (L) 07/27/2012 1100   GFRAA >90 07/27/2012 1100   No results found for: "CHOL", "HDL", "LDLCALC", "LDLDIRECT", "TRIG", "CHOLHDL" No results found for: "HGBA1C" No results found for: "VITAMINB12" No results found for: "TSH"     ASSESSMENT AND PLAN 89 y.o. year old female  has a past medical history of Arthritis, Atrophic vaginitis, Basal cell carcinoma, Cervical dysplasia, Complication of anesthesia, Falls, GERD (gastroesophageal reflux disease), Hypertension, IBS (irritable bowel syndrome), Jaw atrophy, Limb-girdle muscular dystrophy (HCC) (07/05/2013), Macular hole of left eye, Muscular dystrophy (HCC), Osteoporosis, Pseudocholinesterase deficiency, and Scoliosis. here with   No diagnosis found.     Lillyan is doing fairly well, today. She denies new or worsening symptoms. Gait is stable with Rolator. She did have a fall in [place]. Fortunately, no injuries. She does endorse more stress with daughter's recent diagnosis of AD. She does have several family members and friends that continue to care for her. She is followed closely by her PCP. She was encouraged to continue healthy lifestyle habits. Fall and safety precautions discussed. She will follow up regularly with care team as directed. She will follow up with Dr Vickey Huger in 6-12 months, sooner if needed.   No orders of  the defined types were placed in this encounter.     No orders of the defined types were placed in this encounter.       Shawnie Dapper, FNP-C 05/03/2023, 4:48 PM [facility] [address] [place], [place] [address] [phone]

## 2023-05-04 ENCOUNTER — Encounter: Payer: Self-pay | Admitting: Family Medicine

## 2023-05-04 ENCOUNTER — Ambulatory Visit: Payer: PPO | Admitting: Family Medicine

## 2023-05-04 VITALS — BP 142/75 | HR 96 | Ht 62.0 in

## 2023-05-04 DIAGNOSIS — Z9181 History of falling: Secondary | ICD-10-CM | POA: Diagnosis not present

## 2023-05-04 DIAGNOSIS — R29898 Other symptoms and signs involving the musculoskeletal system: Secondary | ICD-10-CM | POA: Diagnosis not present

## 2023-05-04 DIAGNOSIS — R2689 Other abnormalities of gait and mobility: Secondary | ICD-10-CM

## 2023-05-04 DIAGNOSIS — G71039 Limb girdle muscular dystrophy, unspecified: Secondary | ICD-10-CM | POA: Diagnosis not present

## 2023-05-05 ENCOUNTER — Telehealth: Payer: Self-pay | Admitting: Family Medicine

## 2023-05-05 NOTE — Telephone Encounter (Signed)
[facility] is taking this patient.

## 2023-05-06 DIAGNOSIS — M810 Age-related osteoporosis without current pathological fracture: Secondary | ICD-10-CM | POA: Diagnosis not present

## 2023-05-06 DIAGNOSIS — I10 Essential (primary) hypertension: Secondary | ICD-10-CM | POA: Diagnosis not present

## 2023-05-06 DIAGNOSIS — G71039 Limb girdle muscular dystrophy, unspecified: Secondary | ICD-10-CM | POA: Diagnosis not present

## 2023-05-06 DIAGNOSIS — H35342 Macular cyst, hole, or pseudohole, left eye: Secondary | ICD-10-CM | POA: Diagnosis not present

## 2023-05-06 DIAGNOSIS — M5430 Sciatica, unspecified side: Secondary | ICD-10-CM | POA: Diagnosis not present

## 2023-05-06 DIAGNOSIS — M419 Scoliosis, unspecified: Secondary | ICD-10-CM | POA: Diagnosis not present

## 2023-05-06 DIAGNOSIS — K219 Gastro-esophageal reflux disease without esophagitis: Secondary | ICD-10-CM | POA: Diagnosis not present

## 2023-05-06 DIAGNOSIS — N879 Dysplasia of cervix uteri, unspecified: Secondary | ICD-10-CM | POA: Diagnosis not present

## 2023-05-06 DIAGNOSIS — Z79899 Other long term (current) drug therapy: Secondary | ICD-10-CM | POA: Diagnosis not present

## 2023-05-06 DIAGNOSIS — K589 Irritable bowel syndrome without diarrhea: Secondary | ICD-10-CM | POA: Diagnosis not present

## 2023-05-06 DIAGNOSIS — I491 Atrial premature depolarization: Secondary | ICD-10-CM | POA: Diagnosis not present

## 2023-05-06 DIAGNOSIS — M4120 Other idiopathic scoliosis, site unspecified: Secondary | ICD-10-CM | POA: Diagnosis not present

## 2023-05-06 DIAGNOSIS — M1711 Unilateral primary osteoarthritis, right knee: Secondary | ICD-10-CM | POA: Diagnosis not present

## 2023-05-12 NOTE — Telephone Encounter (Signed)
I called that number and a man picked up and he told me I had the incorrect #

## 2023-05-12 NOTE — Telephone Encounter (Signed)
LVM at 2:57 pm: [facility] OT with [facility] calling requesting home health OT orders 1x a wk for 6 wks. If ok please give a call back to [phone].

## 2023-05-18 NOTE — Telephone Encounter (Signed)
[facility] OT has called to provide her # which is [phone], please call re: the needed orders for OT

## 2023-05-18 NOTE — Telephone Encounter (Signed)
Verbal okay given via phone to [facility] for OT

## 2023-06-02 DIAGNOSIS — H35361 Drusen (degenerative) of macula, right eye: Secondary | ICD-10-CM | POA: Diagnosis not present

## 2023-06-02 DIAGNOSIS — H353112 Nonexudative age-related macular degeneration, right eye, intermediate dry stage: Secondary | ICD-10-CM | POA: Diagnosis not present

## 2023-06-02 DIAGNOSIS — Z961 Presence of intraocular lens: Secondary | ICD-10-CM | POA: Diagnosis not present

## 2023-06-02 DIAGNOSIS — H35341 Macular cyst, hole, or pseudohole, right eye: Secondary | ICD-10-CM | POA: Diagnosis not present

## 2023-06-02 DIAGNOSIS — H53411 Scotoma involving central area, right eye: Secondary | ICD-10-CM | POA: Diagnosis not present

## 2023-06-02 DIAGNOSIS — H353221 Exudative age-related macular degeneration, left eye, with active choroidal neovascularization: Secondary | ICD-10-CM | POA: Diagnosis not present

## 2023-06-02 DIAGNOSIS — H5315 Visual distortions of shape and size: Secondary | ICD-10-CM | POA: Diagnosis not present

## 2023-06-08 ENCOUNTER — Telehealth: Payer: Self-pay | Admitting: Family Medicine

## 2023-06-08 ENCOUNTER — Other Ambulatory Visit: Payer: Self-pay

## 2023-06-08 ENCOUNTER — Inpatient Hospital Stay (HOSPITAL_BASED_OUTPATIENT_CLINIC_OR_DEPARTMENT_OTHER)
Admission: EM | Admit: 2023-06-08 | Discharge: 2023-06-14 | DRG: 446 | Disposition: A | Discharge status: Skilled Nursing Facility | Payer: PPO | Attending: Internal Medicine | Admitting: Internal Medicine

## 2023-06-08 ENCOUNTER — Emergency Department (HOSPITAL_BASED_OUTPATIENT_CLINIC_OR_DEPARTMENT_OTHER): Payer: PPO

## 2023-06-08 ENCOUNTER — Encounter (HOSPITAL_BASED_OUTPATIENT_CLINIC_OR_DEPARTMENT_OTHER): Payer: Self-pay

## 2023-06-08 DIAGNOSIS — I471 Supraventricular tachycardia, unspecified: Secondary | ICD-10-CM

## 2023-06-08 DIAGNOSIS — K8000 Calculus of gallbladder with acute cholecystitis without obstruction: Secondary | ICD-10-CM | POA: Diagnosis not present

## 2023-06-08 DIAGNOSIS — K5730 Diverticulosis of large intestine without perforation or abscess without bleeding: Secondary | ICD-10-CM | POA: Diagnosis not present

## 2023-06-08 DIAGNOSIS — R079 Chest pain, unspecified: Secondary | ICD-10-CM | POA: Diagnosis not present

## 2023-06-08 DIAGNOSIS — Z90722 Acquired absence of ovaries, bilateral: Secondary | ICD-10-CM

## 2023-06-08 DIAGNOSIS — Z886 Allergy status to analgesic agent status: Secondary | ICD-10-CM

## 2023-06-08 DIAGNOSIS — Z884 Allergy status to anesthetic agent status: Secondary | ICD-10-CM

## 2023-06-08 DIAGNOSIS — Z993 Dependence on wheelchair: Secondary | ICD-10-CM

## 2023-06-08 DIAGNOSIS — I4510 Unspecified right bundle-branch block: Secondary | ICD-10-CM | POA: Diagnosis present

## 2023-06-08 DIAGNOSIS — R112 Nausea with vomiting, unspecified: Secondary | ICD-10-CM | POA: Diagnosis not present

## 2023-06-08 DIAGNOSIS — R1011 Right upper quadrant pain: Secondary | ICD-10-CM

## 2023-06-08 DIAGNOSIS — Z01818 Encounter for other preprocedural examination: Secondary | ICD-10-CM

## 2023-06-08 DIAGNOSIS — Z881 Allergy status to other antibiotic agents status: Secondary | ICD-10-CM

## 2023-06-08 DIAGNOSIS — R1010 Upper abdominal pain, unspecified: Secondary | ICD-10-CM

## 2023-06-08 DIAGNOSIS — E8809 Other disorders of plasma-protein metabolism, not elsewhere classified: Secondary | ICD-10-CM | POA: Diagnosis present

## 2023-06-08 DIAGNOSIS — R1013 Epigastric pain: Secondary | ICD-10-CM | POA: Diagnosis not present

## 2023-06-08 DIAGNOSIS — K828 Other specified diseases of gallbladder: Secondary | ICD-10-CM | POA: Diagnosis present

## 2023-06-08 DIAGNOSIS — G8929 Other chronic pain: Secondary | ICD-10-CM | POA: Diagnosis present

## 2023-06-08 DIAGNOSIS — R7989 Other specified abnormal findings of blood chemistry: Secondary | ICD-10-CM | POA: Diagnosis present

## 2023-06-08 DIAGNOSIS — K8020 Calculus of gallbladder without cholecystitis without obstruction: Secondary | ICD-10-CM | POA: Diagnosis not present

## 2023-06-08 DIAGNOSIS — I10 Essential (primary) hypertension: Secondary | ICD-10-CM | POA: Diagnosis present

## 2023-06-08 DIAGNOSIS — M810 Age-related osteoporosis without current pathological fracture: Secondary | ICD-10-CM | POA: Diagnosis present

## 2023-06-08 DIAGNOSIS — E785 Hyperlipidemia, unspecified: Secondary | ICD-10-CM | POA: Diagnosis present

## 2023-06-08 DIAGNOSIS — Z87410 Personal history of cervical dysplasia: Secondary | ICD-10-CM

## 2023-06-08 DIAGNOSIS — K580 Irritable bowel syndrome with diarrhea: Secondary | ICD-10-CM | POA: Diagnosis present

## 2023-06-08 DIAGNOSIS — Z8249 Family history of ischemic heart disease and other diseases of the circulatory system: Secondary | ICD-10-CM

## 2023-06-08 DIAGNOSIS — M419 Scoliosis, unspecified: Secondary | ICD-10-CM | POA: Diagnosis present

## 2023-06-08 DIAGNOSIS — G71039 Limb girdle muscular dystrophy, unspecified: Secondary | ICD-10-CM | POA: Diagnosis present

## 2023-06-08 DIAGNOSIS — Z90710 Acquired absence of both cervix and uterus: Secondary | ICD-10-CM

## 2023-06-08 DIAGNOSIS — R7401 Elevation of levels of liver transaminase levels: Secondary | ICD-10-CM | POA: Diagnosis present

## 2023-06-08 DIAGNOSIS — Z79899 Other long term (current) drug therapy: Secondary | ICD-10-CM

## 2023-06-08 DIAGNOSIS — M1990 Unspecified osteoarthritis, unspecified site: Secondary | ICD-10-CM | POA: Diagnosis present

## 2023-06-08 DIAGNOSIS — K819 Cholecystitis, unspecified: Principal | ICD-10-CM

## 2023-06-08 DIAGNOSIS — Z882 Allergy status to sulfonamides status: Secondary | ICD-10-CM

## 2023-06-08 DIAGNOSIS — Z85828 Personal history of other malignant neoplasm of skin: Secondary | ICD-10-CM

## 2023-06-08 DIAGNOSIS — R000 Tachycardia, unspecified: Secondary | ICD-10-CM | POA: Diagnosis not present

## 2023-06-08 DIAGNOSIS — K760 Fatty (change of) liver, not elsewhere classified: Secondary | ICD-10-CM | POA: Diagnosis present

## 2023-06-08 DIAGNOSIS — I493 Ventricular premature depolarization: Secondary | ICD-10-CM | POA: Diagnosis present

## 2023-06-08 DIAGNOSIS — K810 Acute cholecystitis: Secondary | ICD-10-CM | POA: Diagnosis not present

## 2023-06-08 DIAGNOSIS — Z885 Allergy status to narcotic agent status: Secondary | ICD-10-CM

## 2023-06-08 DIAGNOSIS — K219 Gastro-esophageal reflux disease without esophagitis: Secondary | ICD-10-CM | POA: Diagnosis present

## 2023-06-08 DIAGNOSIS — R748 Abnormal levels of other serum enzymes: Secondary | ICD-10-CM

## 2023-06-08 LAB — TROPONIN I (HIGH SENSITIVITY)
Troponin I (High Sensitivity): 10 ng/L (ref <18)
Troponin I (High Sensitivity): 11 ng/L (ref <18)

## 2023-06-08 LAB — CBC
HCT: 42.2 % (ref 36.0–46.0)
Hemoglobin: 14.3 g/dL (ref 12.0–15.0)
MCH: 33 pg (ref 26.0–34.0)
MCHC: 33.9 g/dL (ref 30.0–36.0)
MCV: 97.5 fL (ref 80.0–100.0)
Platelets: 235 10*3/uL (ref 150–400)
RBC: 4.33 MIL/uL (ref 3.87–5.11)
RDW: 12.6 % (ref 11.5–15.5)
WBC: 10 10*3/uL (ref 4.0–10.5)
nRBC: 0 % (ref 0.0–0.2)

## 2023-06-08 LAB — COMPREHENSIVE METABOLIC PANEL
ALT: 136 U/L — ABNORMAL HIGH (ref 0–44)
AST: 253 U/L — ABNORMAL HIGH (ref 15–41)
Albumin: 4.8 g/dL (ref 3.5–5.0)
Alkaline Phosphatase: 88 U/L (ref 38–126)
Anion gap: 15 (ref 5–15)
BUN: 13 mg/dL (ref 8–23)
CO2: 24 mmol/L (ref 22–32)
Calcium: 10.3 mg/dL (ref 8.9–10.3)
Chloride: 98 mmol/L (ref 98–111)
Creatinine, Ser: 0.72 mg/dL (ref 0.44–1.00)
GFR, Estimated: >60 mL/min (ref >60)
Glucose, Bld: 206 mg/dL — ABNORMAL HIGH (ref 70–99)
Potassium: 3.8 mmol/L (ref 3.5–5.1)
Sodium: 137 mmol/L (ref 135–145)
Total Bilirubin: 2.3 mg/dL — ABNORMAL HIGH (ref 0.3–1.2)
Total Protein: 7.6 g/dL (ref 6.5–8.1)

## 2023-06-08 LAB — LIPASE, BLOOD: Lipase: 19 U/L (ref 11–51)

## 2023-06-08 LAB — LACTIC ACID, PLASMA: Lactic Acid, Venous: 4.7 mmol/L (ref 0.5–1.9)

## 2023-06-08 MED ORDER — IOHEXOL 300 MG/ML  SOLN: 100.0000 mL | Freq: Once | INTRAMUSCULAR | Status: DC | PRN

## 2023-06-08 MED ORDER — SODIUM CHLORIDE 0.9 % IV BOLUS
500.0000 mL | Freq: Once | INTRAVENOUS | Status: AC
  Administered 2023-06-08: 500 mL via INTRAVENOUS

## 2023-06-08 MED ORDER — SODIUM CHLORIDE 0.9 % IV BOLUS
1500.0000 mL | Freq: Once | INTRAVENOUS | Status: AC
  Administered 2023-06-08: 1500 mL via INTRAVENOUS

## 2023-06-08 MED ORDER — PIPERACILLIN-TAZOBACTAM 3.375 G IVPB 30 MIN
3.3750 g | Freq: Once | INTRAVENOUS | Status: AC
  Administered 2023-06-08: 3.375 g via INTRAVENOUS
  Filled 2023-06-08: qty 50

## 2023-06-08 MED ORDER — IOHEXOL 350 MG/ML SOLN
100.0000 mL | Freq: Once | INTRAVENOUS | Status: AC | PRN
  Administered 2023-06-08: 100 mL via INTRAVENOUS

## 2023-06-08 NOTE — ED Provider Notes (Signed)
[place] [facility]ERGENCY DEPARTMENT AT [facility] Provider Note   CSN: [id number] Arrival date & time: 06/08/23  1843     History  Chief Complaint  Patient presents with   Abdominal Pain    Catherine Harding is a 89 y.o. female.  The history is provided by the patient, a relative and medical records. No language interpreter was used.  Abdominal Pain Pain location:  Epigastric Pain quality: aching and cramping   Pain radiates to:  Chest and back Pain severity:  Severe Onset quality:  Gradual Duration:  1 day Timing:  Constant Progression:  Waxing and waning Chronicity:  New Relieved by:  Nothing Worsened by:  Nothing Associated symptoms: chills, fatigue, nausea and vomiting   Associated symptoms: no chest pain, no constipation, no cough, no diarrhea, no dysuria, no fever and no shortness of breath        Home Medications Prior to Admission medications   Medication Sig Start Date End Date Taking? Authorizing Provider  Artificial Tear Solution (SYSTANE CONTACTS) SOLN Apply to eye.    [provider]  bacitracin ophthalmic ointment Place 500 application into both eyes 3 (three) times daily. 04/19/13   [provider]  Calcium Carbonate Antacid (TUMS PO) Take by mouth daily as needed. chewable    [provider]  Calcium Carbonate-Vit D-Min (CALTRATE PLUS PO) Take by mouth.      [provider]  denosumab (PROLIA) 60 MG/ML SOLN injection Inject 60 mg into the skin every 6 (six) months. Administer in upper arm, thigh, or abdomen    [provider]  diltiazem (CARDIZEM CD) 240 MG 24 hr capsule Take 240 mg by mouth daily.      [provider]  ergocalciferol (VITAMIN D2) 50000 UNITS capsule Take 50,000 Units by mouth 2 (two) times a week.     [provider]  famotidine (PEPCID) 20 MG tablet Take 20 mg by mouth 2 (two) times daily.    [provider]  fluocinonide (LIDEX) 0.05 % external solution Apply  topically. 07/09/21   [provider]  fluticasone Aleda Grana) 50 MCG/ACT nasal spray  01/08/20   [provider]  furosemide (LASIX) 20 MG tablet Take 20 mg by mouth 3 (three) times daily. Patient states she takes 2 tablets in the morning and 1 tablet at 2pm each day.    [provider]  hyoscyamine (LEVSIN SL) 0.125 MG SL tablet  06/12/19   [provider]  ketoconazole (NIZORAL) 2 % cream SMARTSIG:1 Topical Daily PRN 07/09/21   [provider]  losartan-hydrochlorothiazide (HYZAAR) 100-12.5 MG per tablet Take 12.5 tablets by mouth daily. 06/12/13   [provider]  Multiple Vitamins-Minerals (PRESERVISION/LUTEIN) CAPS Take 2 capsules by mouth daily.    [provider]  Omega-3 Fatty Acids (FISH OIL) 1200 MG CAPS Take 2 capsules by mouth daily.    [provider]  Probiotic Product (PROBIOTIC PO) Take 1 Dose by mouth daily.    [provider]  sodium chloride (OCEAN) 0.65 % nasal spray Place 1 spray into the nose as needed for congestion.    [provider]  triamcinolone cream (KENALOG) 0.1 % SMARTSIG:1 Application Topical 2-3 Times Daily 01/08/20   [provider]      Allergies    Anesthetics, amide; Celebrex [celecoxib]; Ciprofloxacin; Codeine; Morphine and codeine; and Sulfa antibiotics    Review of Systems   Review of Systems  Constitutional:  Positive for chills and fatigue. Negative for diaphoresis and fever.  HENT:  Negative for congestion.   Respiratory:  Negative for cough, chest tightness, shortness of breath and wheezing.   Cardiovascular:  Negative for chest pain, palpitations and leg swelling.  Gastrointestinal:  Positive for abdominal pain, nausea and vomiting. Negative for abdominal distention, constipation and diarrhea.  Genitourinary:  Negative for dysuria, flank pain and frequency.  Musculoskeletal:  Positive for back pain. Negative for neck pain.  Neurological:  Negative for  light-headedness and headaches.  Psychiatric/Behavioral:  Negative for agitation.   All other systems reviewed and are negative.   Physical Exam Updated Vital Signs BP (!) 169/86 (BP Location: Right Arm)   Pulse (!) 106   Temp 98.4 F (36.9 C)   Resp 19   Ht 5\' 1"  (1.549 m)   Wt 68 kg   SpO2 99%   BMI 28.34 kg/m  Physical Exam Vitals and nursing note reviewed.  Constitutional:      General: She is not in acute distress.    Appearance: She is well-developed. She is not ill-appearing, toxic-appearing or diaphoretic.  HENT:     Head: Normocephalic and atraumatic.  Eyes:     Conjunctiva/sclera: Conjunctivae normal.  Cardiovascular:     Rate and Rhythm: Normal rate and regular rhythm.     Heart sounds: No murmur heard. Pulmonary:     Effort: Pulmonary effort is normal. No respiratory distress.     Breath sounds: Normal breath sounds.  Abdominal:     General: Abdomen is flat. Bowel sounds are normal.     Palpations: Abdomen is soft.     Tenderness: There is abdominal tenderness in the right upper quadrant and epigastric area. There is no right CVA tenderness, left CVA tenderness, guarding or rebound.  Musculoskeletal:        General: No swelling.     Cervical back: Neck supple.  Skin:    General: Skin is warm and dry.     Capillary Refill: Capillary refill takes less than 2 seconds.  Neurological:     Mental Status: She is alert.  Psychiatric:        Mood and Affect: Mood normal.     ED Results / Procedures / Treatments   Labs (all labs ordered are listed, but only abnormal results are displayed) Labs Reviewed  COMPREHENSIVE METABOLIC PANEL - Abnormal; Notable for the following components:      Result Value   Glucose, Bld 206 (*)    AST 253 (*)    ALT 136 (*)    Total Bilirubin 2.3 (*)    All other components within normal limits  LACTIC ACID, PLASMA - Abnormal; Notable for the following components:   Lactic Acid, Venous 4.7 (*)    All other components within  normal limits  LIPASE, BLOOD  CBC  LACTIC ACID, PLASMA  TROPONIN I (HIGH SENSITIVITY)  TROPONIN I (HIGH SENSITIVITY)    EKG EKG Interpretation Date/Time:  Wednesday June 08 2023 19:23:14 EDT Ventricular Rate:  109 PR Interval:  214 QRS Duration:  136 QT Interval:  320 QTC Calculation: 430 R Axis:   -48  Text Interpretation: Sinus tachycardia with 1st degree A-V block Left axis deviation Right bundle branch block T wave abnormality, consider inferolateral ischemia Abnormal ECG When compared with ECG of 27-Jul-2012 10:22, PR interval has increased Right bundle branch block is now Present when compared to prior, new RBBB when compared to 11 years ago and faster rate. No STEMI Confirmed by Theda Belfast ([id number]) on 06/08/2023 7:25:56 PM  Radiology US Abdomen  Limited RUQ (LIVER/GB)  Result Date: 06/08/2023 CLINICAL DATA:  Epigastric pain, nausea vomiting. EXAM: ULTRASOUND ABDOMEN LIMITED RIGHT UPPER QUADRANT COMPARISON:  CT dated 06/08/2023. FINDINGS: Gallbladder: There multiple stones within the gallbladder. The gallbladder wall is slightly thickened measuring 5 mm. There is a small pericholecystic fluid. Negative sonographic Murphy's sign. Common bile duct: Diameter: 13 mm. Liver: There is diffuse increased liver echogenicity most commonly seen in the setting of fatty infiltration. Superimposed inflammation or fibrosis is not excluded. Clinical correlation is recommended. Portal vein is patent on color Doppler imaging with normal direction of blood flow towards the liver. Other: None. IMPRESSION: 1. Cholelithiasis with equivocal findings for acute cholecystitis. A hepatobiliary scintigraphy may provide better evaluation of the gallbladder if there is a high clinical concern for acute cholecystitis . 2. Fatty liver. Electronically Signed   By: Elgie Collard M.D.   On: 06/08/2023 22:23   CT Angio Chest/Abd/Pel for Dissection W and/or Wo Contrast  Result Date: 06/08/2023 CLINICAL DATA:   Chest and abdominal pain with hypertension, initial encounter EXAM: CT ANGIOGRAPHY CHEST, ABDOMEN AND PELVIS TECHNIQUE: Non-contrast CT of the chest was initially obtained. Multidetector CT imaging through the chest, abdomen and pelvis was performed using the standard protocol during bolus administration of intravenous contrast. Multiplanar reconstructed images and MIPs were obtained and reviewed to evaluate the vascular anatomy. RADIATION DOSE REDUCTION: This exam was performed according to the departmental dose-optimization program which includes automated exposure control, adjustment of the mA and/or kV according to patient size and/or use of iterative reconstruction technique. CONTRAST:  OMNIPAQUE IOHEXOL 350 MG/ML SOLN COMPARISON:  03/23/2023 FINDINGS: CTA CHEST FINDINGS Cardiovascular: Initial precontrast images show atherosclerotic calcification of the thoracic aorta. No hyperdense crescent to suggest acute aortic injury is seen. No aneurysmal dilatation of the aorta is noted on postcontrast images. No dissection is seen. Mild soft atherosclerotic plaque is noted in the distal descending thoracic aorta. The pulmonary artery shows a normal branching pattern bilaterally. No intraluminal filling defect to suggest pulmonary embolism is seen. Coronary calcifications are noted. Mediastinum/Nodes: Thoracic inlet is within normal limits. No hilar or mediastinal adenopathy is noted. The esophagus as visualized is within normal limits. Lungs/Pleura: Lungs are well aerated bilaterally. No focal infiltrate or sizable effusion is seen. Musculoskeletal: Degenerative changes of the thoracic spine are noted. No acute rib abnormality is noted. Old rib fractures are seen bilaterally with healing. Review of the MIP images confirms the above findings. CTA ABDOMEN AND PELVIS FINDINGS VASCULAR Aorta: Atherosclerotic calcifications are noted. No aneurysmal dilatation is seen. Celiac: Patent without evidence of aneurysm,  dissection, vasculitis or significant stenosis. SMA: Patent without evidence of aneurysm, dissection, vasculitis or significant stenosis. Renals: Both renal arteries are patent without evidence of aneurysm, dissection, vasculitis, fibromuscular dysplasia or significant stenosis. IMA: Patent without evidence of aneurysm, dissection, vasculitis or significant stenosis. Inflow: Iliacs demonstrate atherosclerotic calcification without stenosis or aneurysmal dilatation. Veins: No specific venous abnormality is noted. Review of the MIP images confirms the above findings. NON-VASCULAR Hepatobiliary: Liver is within normal limits. Gallbladder is significantly distended with dependent density consistent with cholelithiasis. No biliary ductal dilatation is seen. Pancreas: Unremarkable. No pancreatic ductal dilatation or surrounding inflammatory changes. Spleen: Normal in size without focal abnormality. Adrenals/Urinary Tract: Adrenal glands are within normal limits. Kidneys demonstrate a normal enhancement pattern bilaterally. No renal calculi or obstructive changes are seen. The bladder is decompressed. Stomach/Bowel: Scattered diverticular change of the colon is noted without evidence of diverticulitis. The appendix is not well visualized. No inflammatory changes  to suggest appendicitis are noted. Small bowel and stomach are within normal limits. Lymphatic: No lymphadenopathy is seen. Reproductive: Status post hysterectomy. No adnexal masses. Other: No abdominal wall hernia or abnormality. No abdominopelvic ascites. Musculoskeletal: Degenerative changes of lumbar spine are noted. No acute abnormality noted. Review of the MIP images confirms the above findings. IMPRESSION: CTA of the chest: No evidence of aortic dissection. No evidence of pulmonary emboli is noted. CTA of the abdomen and pelvis: No arterial abnormality is noted. Diverticulosis without diverticulitis. Well distended gallbladder with evidence of  cholelithiasis. Electronically Signed   By: Alcide Clever M.D.   On: 06/08/2023 21:55    Procedures Procedures    CRITICAL CARE Performed by: Canary Brim Spencer Cardinal Total critical care time: 35 minutes Critical care time was exclusive of separately billable procedures and treating other patients. Critical care was necessary to treat or prevent imminent or life-threatening deterioration. Critical care was time spent personally by me on the following activities: development of treatment plan with patient and/or surrogate as well as nursing, discussions with consultants, evaluation of patient's response to treatment, examination of patient, obtaining history from patient or surrogate, ordering and performing treatments and interventions, ordering and review of laboratory studies, ordering and review of radiographic studies, pulse oximetry and re-evaluation of patient's condition.  Medications Ordered in ED Medications  iohexol (OMNIPAQUE) 300 MG/ML solution 100 mL (has no administration in time range)  piperacillin-tazobactam (ZOSYN) IVPB 3.375 g (3.375 g Intravenous New Bag/Given 06/08/23 2322)  sodium chloride 0.9 % bolus 500 mL (0 mLs Intravenous Stopped 06/08/23 2236)  iohexol (OMNIPAQUE) 350 MG/ML injection 100 mL (100 mLs Intravenous Contrast Given 06/08/23 2122)  sodium chloride 0.9 % bolus 1,500 mL (1,500 mLs Intravenous New Bag/Given 06/08/23 2327)    ED Course/ Medical Decision Making/ A&P                                 Medical Decision Making Amount and/or Complexity of Data Reviewed Labs: ordered. Radiology: ordered.  Risk Prescription drug management. Decision regarding hospitalization.    Catherine Harding is a 89 y.o. female with a past medical history significant for hypertension, GERD, muscular dystrophy, osteoporosis, and IBS who presents with severe pain in her lower chest, abdomen, back, and nausea and vomiting.  According to patient, symptoms began today after eating  some boiled chicken.  She reports it is an aching and dull pain but severe in her central abdomen and upper abdomen going to her back and her lower chest.  She denies any upper chest pain palpitations or shortness of breath.  Reports some nausea and vomiting but denies any constipation or diarrhea.  Denies any urinary changes.  Denies trauma.  She has not had this pain before.  She reports that she has had a history of gallstones but has never had her gallbladder taken out.  No rashes or other complaints reported.  On exam, lungs clear.  Chest nontender.  Abdomen is tender to palpation but back is nontender.  Bowel sounds are appreciated.  Good pulses in extremities.  Patient has dry mucous membranes.  Blood pressure was elevated in the 170s on arrival, given the patient reporting pain in her central abdomen and lower chest going straight to her back with elevated blood pressures, will get a dissection study but will also get a right upper quadrant ultrasound given my concern for cholecystitis.  Will get labs and give her some fluids  and medications.  CT and ultrasound are concerning for cholelithiasis and shows pericholecystic fluid and some wall thickening.  Clinically I am concerned about acute cholecystitis.  She also has LFT elevation and elevated lactic acid.  Fluids were given.  I called and spoke with Dr. Cliffton Asters with general surgery who recommended admission to medicine at [facility] and they will see for further management and discussion about possible surgery.  They recommended repeat LFT in the morning to further create a plan.  Patient is feeling better now.  CT scan did not show dissection or PE.  Patient will be admitted for further management.         Final Clinical Impression(s) / ED Diagnoses Final diagnoses:  Cholecystitis  Pain of upper abdomen     Clinical Impression: 1. Cholecystitis   2. Pain of upper abdomen     Disposition: Admit  This note was prepared with  assistance of Dragon voice recognition software. Occasional wrong-word or sound-a-like substitutions may have occurred due to the inherent limitations of voice recognition software.     Chizara Mena, Canary Brim, MD 06/08/23 [phone]

## 2023-06-08 NOTE — ED Triage Notes (Signed)
Epigastric abdominal pain and back pain onset this afternoon, +n/v, denies diarrhea.

## 2023-06-08 NOTE — ED Notes (Signed)
Report given to the next RN... 

## 2023-06-08 NOTE — Telephone Encounter (Signed)
Spoke with patient and she just wanted the office to know that PT has not came out to her home yet. She called and spoke with her insurance company and was told that it takes about 30 days to process. Pt said OT did come out and help her, she asked I just document that PT has not came out to her home yet as of 06/08/23. I explained that if it takes 30 days she may hear from PT the week of 06/18/23 because OT called on 05/18/23 to get verbal orders. It appears on referral that PT was ordered along with OT.  Pt verbalized she understood.

## 2023-06-08 NOTE — Telephone Encounter (Signed)
Pt said, need referral for physical therapy change to in home physical therapy due to not able to walk and to far to come. Would like a call back.

## 2023-06-08 NOTE — ED Notes (Signed)
Labs delayed- pt unavailable- at scans.

## 2023-06-08 NOTE — Telephone Encounter (Signed)
Returned patients call. No answer. Left message to call back.

## 2023-06-09 ENCOUNTER — Inpatient Hospital Stay (HOSPITAL_COMMUNITY): Payer: PPO

## 2023-06-09 ENCOUNTER — Observation Stay (HOSPITAL_COMMUNITY): Payer: PPO

## 2023-06-09 DIAGNOSIS — G7100 Muscular dystrophy, unspecified: Secondary | ICD-10-CM | POA: Diagnosis not present

## 2023-06-09 DIAGNOSIS — R109 Unspecified abdominal pain: Secondary | ICD-10-CM | POA: Diagnosis not present

## 2023-06-09 DIAGNOSIS — R1011 Right upper quadrant pain: Secondary | ICD-10-CM | POA: Diagnosis not present

## 2023-06-09 DIAGNOSIS — Z01818 Encounter for other preprocedural examination: Secondary | ICD-10-CM | POA: Diagnosis not present

## 2023-06-09 DIAGNOSIS — I10 Essential (primary) hypertension: Secondary | ICD-10-CM

## 2023-06-09 DIAGNOSIS — I4510 Unspecified right bundle-branch block: Secondary | ICD-10-CM | POA: Diagnosis not present

## 2023-06-09 DIAGNOSIS — Z79899 Other long term (current) drug therapy: Secondary | ICD-10-CM | POA: Diagnosis not present

## 2023-06-09 DIAGNOSIS — I493 Ventricular premature depolarization: Secondary | ICD-10-CM | POA: Diagnosis not present

## 2023-06-09 DIAGNOSIS — K838 Other specified diseases of biliary tract: Secondary | ICD-10-CM | POA: Diagnosis not present

## 2023-06-09 DIAGNOSIS — K810 Acute cholecystitis: Secondary | ICD-10-CM | POA: Diagnosis not present

## 2023-06-09 DIAGNOSIS — K5730 Diverticulosis of large intestine without perforation or abscess without bleeding: Secondary | ICD-10-CM | POA: Diagnosis not present

## 2023-06-09 DIAGNOSIS — Z90722 Acquired absence of ovaries, bilateral: Secondary | ICD-10-CM | POA: Diagnosis not present

## 2023-06-09 DIAGNOSIS — Z85828 Personal history of other malignant neoplasm of skin: Secondary | ICD-10-CM | POA: Diagnosis not present

## 2023-06-09 DIAGNOSIS — K8020 Calculus of gallbladder without cholecystitis without obstruction: Secondary | ICD-10-CM | POA: Diagnosis not present

## 2023-06-09 DIAGNOSIS — Z993 Dependence on wheelchair: Secondary | ICD-10-CM | POA: Diagnosis not present

## 2023-06-09 DIAGNOSIS — I471 Supraventricular tachycardia, unspecified: Secondary | ICD-10-CM | POA: Diagnosis not present

## 2023-06-09 DIAGNOSIS — K219 Gastro-esophageal reflux disease without esophagitis: Secondary | ICD-10-CM | POA: Diagnosis not present

## 2023-06-09 DIAGNOSIS — E785 Hyperlipidemia, unspecified: Secondary | ICD-10-CM | POA: Diagnosis not present

## 2023-06-09 DIAGNOSIS — M419 Scoliosis, unspecified: Secondary | ICD-10-CM | POA: Diagnosis not present

## 2023-06-09 DIAGNOSIS — R2681 Unsteadiness on feet: Secondary | ICD-10-CM | POA: Diagnosis not present

## 2023-06-09 DIAGNOSIS — I081 Rheumatic disorders of both mitral and tricuspid valves: Secondary | ICD-10-CM | POA: Diagnosis not present

## 2023-06-09 DIAGNOSIS — R748 Abnormal levels of other serum enzymes: Secondary | ICD-10-CM | POA: Diagnosis not present

## 2023-06-09 DIAGNOSIS — R7401 Elevation of levels of liver transaminase levels: Secondary | ICD-10-CM | POA: Diagnosis not present

## 2023-06-09 DIAGNOSIS — G71039 Limb girdle muscular dystrophy, unspecified: Secondary | ICD-10-CM | POA: Diagnosis not present

## 2023-06-09 DIAGNOSIS — K580 Irritable bowel syndrome with diarrhea: Secondary | ICD-10-CM | POA: Diagnosis not present

## 2023-06-09 DIAGNOSIS — M6281 Muscle weakness (generalized): Secondary | ICD-10-CM | POA: Diagnosis not present

## 2023-06-09 DIAGNOSIS — Z87410 Personal history of cervical dysplasia: Secondary | ICD-10-CM | POA: Diagnosis not present

## 2023-06-09 DIAGNOSIS — M1990 Unspecified osteoarthritis, unspecified site: Secondary | ICD-10-CM | POA: Diagnosis not present

## 2023-06-09 DIAGNOSIS — R945 Abnormal results of liver function studies: Secondary | ICD-10-CM | POA: Diagnosis not present

## 2023-06-09 DIAGNOSIS — R0902 Hypoxemia: Secondary | ICD-10-CM | POA: Diagnosis not present

## 2023-06-09 DIAGNOSIS — E8809 Other disorders of plasma-protein metabolism, not elsewhere classified: Secondary | ICD-10-CM | POA: Diagnosis not present

## 2023-06-09 DIAGNOSIS — K8000 Calculus of gallbladder with acute cholecystitis without obstruction: Secondary | ICD-10-CM | POA: Diagnosis not present

## 2023-06-09 DIAGNOSIS — M4120 Other idiopathic scoliosis, site unspecified: Secondary | ICD-10-CM | POA: Diagnosis not present

## 2023-06-09 DIAGNOSIS — G8929 Other chronic pain: Secondary | ICD-10-CM | POA: Diagnosis not present

## 2023-06-09 DIAGNOSIS — R279 Unspecified lack of coordination: Secondary | ICD-10-CM | POA: Diagnosis not present

## 2023-06-09 DIAGNOSIS — R7989 Other specified abnormal findings of blood chemistry: Secondary | ICD-10-CM | POA: Diagnosis not present

## 2023-06-09 DIAGNOSIS — Z881 Allergy status to other antibiotic agents status: Secondary | ICD-10-CM | POA: Diagnosis not present

## 2023-06-09 DIAGNOSIS — K760 Fatty (change of) liver, not elsewhere classified: Secondary | ICD-10-CM | POA: Diagnosis not present

## 2023-06-09 DIAGNOSIS — R41841 Cognitive communication deficit: Secondary | ICD-10-CM | POA: Diagnosis not present

## 2023-06-09 DIAGNOSIS — Z90710 Acquired absence of both cervix and uterus: Secondary | ICD-10-CM | POA: Diagnosis not present

## 2023-06-09 DIAGNOSIS — Z884 Allergy status to anesthetic agent status: Secondary | ICD-10-CM | POA: Diagnosis not present

## 2023-06-09 DIAGNOSIS — K828 Other specified diseases of gallbladder: Secondary | ICD-10-CM | POA: Diagnosis not present

## 2023-06-09 DIAGNOSIS — K819 Cholecystitis, unspecified: Secondary | ICD-10-CM | POA: Diagnosis not present

## 2023-06-09 DIAGNOSIS — K589 Irritable bowel syndrome without diarrhea: Secondary | ICD-10-CM | POA: Diagnosis not present

## 2023-06-09 DIAGNOSIS — M810 Age-related osteoporosis without current pathological fracture: Secondary | ICD-10-CM | POA: Diagnosis not present

## 2023-06-09 DIAGNOSIS — N281 Cyst of kidney, acquired: Secondary | ICD-10-CM | POA: Diagnosis not present

## 2023-06-09 LAB — BLOOD GAS, ARTERIAL
Acid-Base Excess: 0.4 mmol/L (ref 0.0–2.0)
Bicarbonate: 23.1 mmol/L (ref 20.0–28.0)
O2 Saturation: 94.9 %
Patient temperature: 37.1
pCO2 arterial: 31 mmHg — ABNORMAL LOW (ref 32–48)
pH, Arterial: 7.48 — ABNORMAL HIGH (ref 7.35–7.45)
pO2, Arterial: 75 mmHg — ABNORMAL LOW (ref 83–108)

## 2023-06-09 LAB — ECHOCARDIOGRAM COMPLETE
AR max vel: 1.89 cm2
AV Area VTI: 1.99 cm2
AV Area mean vel: 1.83 cm2
AV Mean grad: 10 mmHg
AV Peak grad: 17.5 mmHg
Ao pk vel: 2.09 m/s
Area-P 1/2: 3.03 cm2
Height: 61 in
S' Lateral: 2.4 cm
Weight: 2400 oz

## 2023-06-09 LAB — COMPREHENSIVE METABOLIC PANEL
ALT: 317 U/L — ABNORMAL HIGH (ref 0–44)
AST: 384 U/L — ABNORMAL HIGH (ref 15–41)
Albumin: 3.1 g/dL — ABNORMAL LOW (ref 3.5–5.0)
Alkaline Phosphatase: 98 U/L (ref 38–126)
Anion gap: 9 (ref 5–15)
BUN: 8 mg/dL (ref 8–23)
CO2: 22 mmol/L (ref 22–32)
Calcium: 8.5 mg/dL — ABNORMAL LOW (ref 8.9–10.3)
Chloride: 106 mmol/L (ref 98–111)
Creatinine, Ser: 0.73 mg/dL (ref 0.44–1.00)
GFR, Estimated: >60 mL/min (ref >60)
Glucose, Bld: 120 mg/dL — ABNORMAL HIGH (ref 70–99)
Potassium: 3.3 mmol/L — ABNORMAL LOW (ref 3.5–5.1)
Sodium: 137 mmol/L (ref 135–145)
Total Bilirubin: 3.7 mg/dL — ABNORMAL HIGH (ref 0.3–1.2)
Total Protein: 5.6 g/dL — ABNORMAL LOW (ref 6.5–8.1)

## 2023-06-09 LAB — CBC WITH DIFFERENTIAL/PLATELET
Abs Immature Granulocytes: 0.03 10*3/uL (ref 0.00–0.07)
Basophils Absolute: 0 10*3/uL (ref 0.0–0.1)
Basophils Relative: 0 %
Eosinophils Absolute: 0 10*3/uL (ref 0.0–0.5)
Eosinophils Relative: 0 %
HCT: 33 % — ABNORMAL LOW (ref 36.0–46.0)
Hemoglobin: 11.2 g/dL — ABNORMAL LOW (ref 12.0–15.0)
Immature Granulocytes: 0 %
Lymphocytes Relative: 13 %
Lymphs Abs: 1.3 10*3/uL (ref 0.7–4.0)
MCH: 32.7 pg (ref 26.0–34.0)
MCHC: 33.9 g/dL (ref 30.0–36.0)
MCV: 96.2 fL (ref 80.0–100.0)
Monocytes Absolute: 1.6 10*3/uL — ABNORMAL HIGH (ref 0.1–1.0)
Monocytes Relative: 16 %
Neutro Abs: 7.1 10*3/uL (ref 1.7–7.7)
Neutrophils Relative %: 71 %
Platelets: 180 10*3/uL (ref 150–400)
RBC: 3.43 MIL/uL — ABNORMAL LOW (ref 3.87–5.11)
RDW: 12.8 % (ref 11.5–15.5)
WBC: 10.1 10*3/uL (ref 4.0–10.5)
nRBC: 0 % (ref 0.0–0.2)

## 2023-06-09 LAB — BRAIN NATRIURETIC PEPTIDE: B Natriuretic Peptide: 345.2 pg/mL — ABNORMAL HIGH (ref 0.0–100.0)

## 2023-06-09 LAB — LACTIC ACID, PLASMA: Lactic Acid, Venous: 3.1 mmol/L (ref 0.5–1.9)

## 2023-06-09 MED ORDER — HEPARIN SODIUM (PORCINE) 5000 UNIT/ML IJ SOLN
5000.0000 [IU] | Freq: Three times a day (TID) | INTRAMUSCULAR | Status: DC
  Administered 2023-06-10 – 2023-06-12 (×4): 5000 [IU] via SUBCUTANEOUS
  Filled 2023-06-09 (×8): qty 1

## 2023-06-09 MED ORDER — ONDANSETRON HCL 4 MG PO TABS: 4.0000 mg | ORAL_TABLET | Freq: Four times a day (QID) | ORAL | Status: DC | PRN

## 2023-06-09 MED ORDER — PIPERACILLIN-TAZOBACTAM 3.375 G IVPB 30 MIN: 3.3750 g | Freq: Four times a day (QID) | INTRAVENOUS | Status: DC

## 2023-06-09 MED ORDER — DILTIAZEM HCL ER COATED BEADS 240 MG PO CP24
240.0000 mg | ORAL_CAPSULE | Freq: Every day | ORAL | Status: DC
  Administered 2023-06-09 – 2023-06-14 (×6): 240 mg via ORAL
  Filled 2023-06-09 (×6): qty 1

## 2023-06-09 MED ORDER — SODIUM CHLORIDE 0.9 % IV SOLN: INTRAVENOUS | Status: AC

## 2023-06-09 MED ORDER — PIPERACILLIN-TAZOBACTAM 3.375 G IVPB
3.3750 g | Freq: Three times a day (TID) | INTRAVENOUS | Status: DC
  Administered 2023-06-09 – 2023-06-14 (×16): 3.375 g via INTRAVENOUS
  Filled 2023-06-09 (×17): qty 50

## 2023-06-09 MED ORDER — HYDROMORPHONE HCL 1 MG/ML IJ SOLN
0.5000 mg | INTRAMUSCULAR | Status: DC | PRN
  Administered 2023-06-10 – 2023-06-13 (×4): 0.5 mg via INTRAVENOUS
  Filled 2023-06-09 (×4): qty 0.5

## 2023-06-09 MED ORDER — ALBUTEROL SULFATE (2.5 MG/3ML) 0.083% IN NEBU: 2.5000 mg | INHALATION_SOLUTION | RESPIRATORY_TRACT | Status: DC | PRN

## 2023-06-09 MED ORDER — GADOBUTROL 1 MMOL/ML IV SOLN
7.0000 mL | Freq: Once | INTRAVENOUS | Status: AC | PRN
  Administered 2023-06-09: 7 mL via INTRAVENOUS

## 2023-06-09 MED ORDER — ONDANSETRON HCL 4 MG/2ML IJ SOLN: 4.0000 mg | Freq: Four times a day (QID) | INTRAMUSCULAR | Status: DC | PRN

## 2023-06-09 NOTE — ED Notes (Signed)
Patient resting quietly in stretcher with eyes closed, respirations even, unlabored, no acute distress noted. Denies needs at this time.

## 2023-06-09 NOTE — Consult Note (Signed)
[facility] Surgery Consult Note  Catherine Harding [birth year]  [id number].    Requesting MD: Skip Mayer Chief Complaint/Reason for Consult: cholecystitis  HPI:  Catherine Harding is a 89 y.o. female PMH muscular dystrophy, Pseudocholinesterase deficiency, HTN, PACs, IBS who presented to the ED last night with chief complaint abdominal pain. States that her symptoms started around 1400 yesterday after eating boiled chicken breast for lunch. She reports severe upper abdominal pain and nausea, with multiple episodes of emesis in the ED. States that she has never had pain like this before. Worked up by EDP with CT and u/s that is concerning for possible cholecystitis; common bile duct 13mm. WBC 10. LFTs elevated with AST 253, ALT 136, alk phos 88, Tbili 2.3.  Patient was started on IV zosyn and admitted to the medical service. General surgery asked to see.  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   Macular hole of left eye    Muscular dystrophy (HCC)    Osteoporosis    pelvic fracture   Pseudocholinesterase deficiency    Scoliosis     Past Surgical History:  Procedure Laterality Date   ABDOMINAL HYSTERECTOMY     TAH BSO   BREAST LUMPECTOMY  1971   benign   CESAREAN SECTION     x2   COLPOSCOPY     EYE SURGERY  2003   left   TOTAL ABDOMINAL HYSTERECTOMY W/  BILATERAL SALPINGOOPHORECTOMY  1981   TRIGGER FINGER RELEASE  07/28/2012   Procedure: RELEASE TRIGGER FINGER/A-1 PULLEY;  Surgeon: Wyn Forster., MD;  Location: [facility];  Service: Orthopedics;  Laterality: Right;  EXCISION CYST RIGHT LONG A-1 RELEASE A-1 RIGHT LONG     Social History:  reports that she has never smoked. She has never used smokeless tobacco. She reports that she does not drink alcohol and does not use drugs.  Allergies:  Allergies  Allergen Reactions   Anesthetics, Amide    Ciprofloxacin Swelling   Celebrex [Celecoxib]    Sulfa Antibiotics    Codeine Nausea And Vomiting   Morphine And Codeine Nausea And Vomiting    Medications Prior to Admission  Medication Sig Dispense Refill   Artificial Tear Solution (SYSTANE CONTACTS) SOLN Apply to eye.     bacitracin ophthalmic ointment Place 500 application into both eyes 3 (three) times daily.     Calcium Carbonate Antacid (TUMS PO) Take by mouth daily as needed. chewable     Calcium Carbonate-Vit D-Min (CALTRATE PLUS PO) Take by mouth.       denosumab (PROLIA) 60 MG/ML SOLN injection Inject 60 mg into the skin every 6 (six) months. Administer in upper arm, thigh, or abdomen     diltiazem (CARDIZEM CD) 240 MG 24 hr capsule Take 240 mg by mouth daily.       ergocalciferol (VITAMIN D2)  50000 UNITS capsule Take 50,000 Units by mouth 2 (two) times a week.      famotidine (PEPCID) 20 MG tablet Take 20 mg by mouth 2 (two) times daily.     fluocinonide (LIDEX) 0.05 % external solution Apply topically.     fluticasone (FLONASE) 50 MCG/ACT nasal spray      furosemide (LASIX) 20 MG tablet Take 20 mg by mouth 3 (three) times daily. Patient states she takes 2 tablets in the morning and 1 tablet at 2pm each day.     hyoscyamine (LEVSIN SL) 0.125 MG SL tablet      ketoconazole (NIZORAL) 2 % cream SMARTSIG:1 Topical Daily PRN     losartan-hydrochlorothiazide (HYZAAR) 100-12.5 MG per tablet Take 12.5 tablets by mouth  daily.     Multiple Vitamins-Minerals (PRESERVISION/LUTEIN) CAPS Take 2 capsules by mouth daily.     Omega-3 Fatty Acids (FISH OIL) 1200 MG CAPS Take 2 capsules by mouth daily.     Probiotic Product (PROBIOTIC PO) Take 1 Dose by mouth daily.     sodium chloride (OCEAN) 0.65 % nasal spray Place 1 spray into the nose as needed for congestion.     triamcinolone cream (KENALOG) 0.1 % SMARTSIG:1 Application Topical 2-3 Times Daily      Prior to Admission medications   Medication Sig Start Date End Date Taking? Authorizing Provider  Artificial Tear Solution (SYSTANE CONTACTS) SOLN Apply to eye.    [provider]  bacitracin ophthalmic ointment Place 500 application into both eyes 3 (three) times daily. 04/19/13   [provider]  Calcium Carbonate Antacid (TUMS PO) Take by mouth daily as needed. chewable    [provider]  Calcium Carbonate-Vit D-Min (CALTRATE PLUS PO) Take by mouth.      [provider]  denosumab (PROLIA) 60 MG/ML SOLN injection Inject 60 mg into the skin every 6 (six) months. Administer in upper arm, thigh, or abdomen    [provider]  diltiazem (CARDIZEM CD) 240 MG 24 hr capsule Take 240 mg by mouth daily.      [provider]  ergocalciferol (VITAMIN D2) 50000 UNITS capsule Take 50,000 Units by mouth 2 (two) times a week.     [provider]  famotidine (PEPCID) 20 MG tablet Take 20 mg by mouth 2 (two) times daily.    [provider]  fluocinonide (LIDEX) 0.05 % external solution Apply topically. 07/09/21   [provider]  fluticasone Aleda Grana) 50 MCG/ACT nasal spray  01/08/20   [provider]  furosemide (LASIX) 20 MG tablet Take 20 mg by mouth 3 (three) times daily. Patient states she takes 2 tablets in the morning and 1 tablet at 2pm each day.    [provider]  hyoscyamine (LEVSIN SL) 0.125 MG SL tablet  06/12/19   [provider]  ketoconazole (NIZORAL) 2 % cream  SMARTSIG:1 Topical Daily PRN 07/09/21   [provider]  losartan-hydrochlorothiazide (HYZAAR) 100-12.5 MG per tablet Take 12.5 tablets by mouth daily. 06/12/13   [provider]  Multiple Vitamins-Minerals (PRESERVISION/LUTEIN) CAPS Take 2 capsules by mouth daily.    [provider]  Omega-3 Fatty Acids (FISH OIL) 1200 MG CAPS Take 2 capsules by mouth daily.    [provider]  Probiotic Product (PROBIOTIC PO) Take 1 Dose by mouth daily.    [provider]  sodium chloride (OCEAN) 0.65 % nasal spray Place 1 spray into the nose as needed for congestion.    [provider]  triamcinolone cream (KENALOG) 0.1 % SMARTSIG:1 Application Topical 2-3 Times Daily 01/08/20   [provider]    Blood pressure 131/67, pulse 70, temperature 98.8 F (37.1 C), temperature source Oral, resp. rate 16, height 5\' 1"  (1.549 m), weight 68 kg, SpO2 95%. Physical Exam: General: pleasant, elderly female who is laying in bed in NAD HEENT: head is normocephalic, atraumatic.  Sclera are noninjected.  Pupils equal and round.  Ears and nose without any masses or lesions.  Mouth is pink and moist. Dentition fair Heart: regular, rate, and rhythm  Lungs: Respiratory effort nonlabored on [place] Abd: soft, ND, +BS, no masses, hernias, or organomegaly. Mild epigastric TTP without rebound or guarding MS: mild LE edema bilaterally Skin: warm and dry with no masses, lesions, or rashes Psych: A&Ox4 with an appropriate affect Neuro: MAEs, no gross motor or sensory deficits BUE/BLE  Results for orders placed or performed during the hospital encounter of 06/08/23 (from the past 48 hour(s))  Lipase, blood     Status: None   Collection Time: 06/08/23  7:22 PM  Result Value Ref Range   Lipase 19 11 - 51 U/L    Comment: Performed at [facility], [address] [place], [place] [address]  Comprehensive metabolic panel     Status: Abnormal   Collection Time:  06/08/23  7:22 PM  Result Value Ref Range   Sodium 137 135 - 145 mmol/L   Potassium 3.8 3.5 - 5.1 mmol/L   Chloride 98 98 - 111 mmol/L   CO2 24 22 - 32 mmol/L   Glucose, Bld 206 (H) 70 - 99 mg/dL    Comment: Glucose reference range applies only to samples taken after fasting for at least 8 hours.   BUN 13 8 - 23 mg/dL   Creatinine, Ser 7.84 0.44 - 1.00 mg/dL   Calcium 69.6 8.9 - 29.5 mg/dL   Total Protein 7.6 6.5 - 8.1 g/dL   Albumin 4.8 3.5 - 5.0 g/dL   AST 284 (H) 15 - 41 U/L   ALT 136 (H) 0 - 44 U/L   Alkaline Phosphatase 88 38 - 126 U/L   Total Bilirubin 2.3 (H) 0.3 - 1.2 mg/dL   GFR, Estimated >13 >24 mL/min    Comment: (NOTE) Calculated using the CKD-EPI Creatinine Equation (2021)    Anion gap 15 5 - 15    Comment: Performed at [facility], [address], [place], [place] [address]  CBC     Status: None   Collection Time: 06/08/23  7:22 PM  Result Value Ref Range   WBC 10.0 4.0 - 10.5 K/uL   RBC 4.33 3.87 - 5.11 MIL/uL   Hemoglobin 14.3 12.0 - 15.0 g/dL   HCT 72.5 36.6 - 44.0 %   MCV 97.5 80.0 - 100.0 fL   MCH 33.0 26.0 - 34.0 pg   MCHC 33.9 30.0 - 36.0 g/dL   RDW 34.7 42.5 - 95.6 %   Platelets 235 150 - 400 K/uL   nRBC 0.0 0.0 - 0.2 %    Comment: Performed at [facility], [address], [place], [place] [address]  Troponin I (High Sensitivity)     Status: None   Collection Time: 06/08/23  7:22 PM  Result Value Ref Range   Troponin I (High Sensitivity) 10 <18 ng/L    Comment: (NOTE) Elevated high sensitivity troponin I (hsTnI) values and significant  changes across serial measurements may suggest ACS but many other  chronic and acute conditions  are known to elevate hsTnI results.  Refer to the "Links" section for chest pain algorithms and additional  guidance. Performed at [facility], [address], [place], [place] [address]   Lactic acid, plasma     Status: Abnormal   Collection Time:  06/08/23  9:43 PM  Result Value Ref Range   Lactic Acid, Venous 4.7 (HH) 0.5 - 1.9 mmol/L    Comment: CRITICAL RESULT CALLED TO, READ BACK BY AND VERIFIED WITH: ANNA J BY RP [id number] 06/08/23 Performed at [facility], [address], [place], [place] [address]   Troponin I (High Sensitivity)     Status: None   Collection Time: 06/08/23  9:43 PM  Result Value Ref Range   Troponin I (High Sensitivity) 11 <18 ng/L    Comment: (NOTE) Elevated high sensitivity troponin I (hsTnI) values and significant  changes across serial measurements may suggest ACS but many other  chronic and acute conditions are known to elevate hsTnI results.  Refer to the "Links" section for chest pain algorithms and additional  guidance. Performed at [facility], [address] [place], [place] [address]   Lactic acid, plasma     Status: Abnormal   Collection Time: 06/09/23  4:40 AM  Result Value Ref Range   Lactic Acid, Venous 3.1 (HH) 0.5 - 1.9 mmol/L    Comment: CRITICAL RESULT CALLED TO, READ BACK BY AND VERIFIED WITH: Maxcine Ham, RN BY RP [phone] 06/09/23 Performed at [facility], [address] [place], [place] [address]    US Abdomen Limited RUQ (LIVER/GB)  Result Date: 06/08/2023 CLINICAL DATA:  Epigastric pain, nausea vomiting. EXAM: ULTRASOUND ABDOMEN LIMITED RIGHT UPPER QUADRANT COMPARISON:  CT dated 06/08/2023. FINDINGS: Gallbladder: There multiple stones within the gallbladder. The gallbladder wall is slightly thickened measuring 5 mm. There is a small pericholecystic fluid. Negative sonographic Murphy's sign. Common bile duct: Diameter: 13 mm. Liver: There is diffuse increased liver echogenicity most commonly seen in the setting of fatty infiltration. Superimposed inflammation or fibrosis is not excluded. Clinical correlation is recommended. Portal vein is patent on color Doppler imaging with normal direction of blood flow towards the liver. Other:  None. IMPRESSION: 1. Cholelithiasis with equivocal findings for acute cholecystitis. A hepatobiliary scintigraphy may provide better evaluation of the gallbladder if there is a high clinical concern for acute cholecystitis . 2. Fatty liver. Electronically Signed   By: Elgie Collard M.D.   On: 06/08/2023 22:23   CT Angio Chest/Abd/Pel for Dissection W and/or Wo Contrast  Result Date: 06/08/2023 CLINICAL DATA:  Chest and abdominal pain with hypertension, initial encounter EXAM: CT ANGIOGRAPHY CHEST, ABDOMEN AND PELVIS TECHNIQUE: Non-contrast CT of the chest was initially obtained. Multidetector CT imaging through the chest, abdomen and pelvis was performed using the standard protocol during bolus administration of intravenous contrast. Multiplanar reconstructed images and MIPs were obtained and reviewed to evaluate the vascular anatomy. RADIATION DOSE REDUCTION: This exam was performed according to the departmental dose-optimization program which includes automated exposure control, adjustment of the mA and/or kV according to patient size and/or use of iterative reconstruction technique. CONTRAST:  OMNIPAQUE IOHEXOL 350 MG/ML SOLN COMPARISON:  03/23/2023 FINDINGS: CTA CHEST FINDINGS Cardiovascular: Initial precontrast images show atherosclerotic calcification of the thoracic aorta. No hyperdense crescent to suggest acute aortic injury is seen. No aneurysmal dilatation of the aorta is noted on postcontrast images. No dissection is seen. Mild soft atherosclerotic plaque is noted in the distal descending thoracic aorta. The pulmonary artery shows  a normal branching pattern bilaterally. No intraluminal filling defect to suggest pulmonary embolism is seen. Coronary calcifications are noted. Mediastinum/Nodes: Thoracic inlet is within normal limits. No hilar or mediastinal adenopathy is noted. The esophagus as visualized is within normal limits. Lungs/Pleura: Lungs are well aerated bilaterally. No focal  infiltrate or sizable effusion is seen. Musculoskeletal: Degenerative changes of the thoracic spine are noted. No acute rib abnormality is noted. Old rib fractures are seen bilaterally with healing. Review of the MIP images confirms the above findings. CTA ABDOMEN AND PELVIS FINDINGS VASCULAR Aorta: Atherosclerotic calcifications are noted. No aneurysmal dilatation is seen. Celiac: Patent without evidence of aneurysm, dissection, vasculitis or significant stenosis. SMA: Patent without evidence of aneurysm, dissection, vasculitis or significant stenosis. Renals: Both renal arteries are patent without evidence of aneurysm, dissection, vasculitis, fibromuscular dysplasia or significant stenosis. IMA: Patent without evidence of aneurysm, dissection, vasculitis or significant stenosis. Inflow: Iliacs demonstrate atherosclerotic calcification without stenosis or aneurysmal dilatation. Veins: No specific venous abnormality is noted. Review of the MIP images confirms the above findings. NON-VASCULAR Hepatobiliary: Liver is within normal limits. Gallbladder is significantly distended with dependent density consistent with cholelithiasis. No biliary ductal dilatation is seen. Pancreas: Unremarkable. No pancreatic ductal dilatation or surrounding inflammatory changes. Spleen: Normal in size without focal abnormality. Adrenals/Urinary Tract: Adrenal glands are within normal limits. Kidneys demonstrate a normal enhancement pattern bilaterally. No renal calculi or obstructive changes are seen. The bladder is decompressed. Stomach/Bowel: Scattered diverticular change of the colon is noted without evidence of diverticulitis. The appendix is not well visualized. No inflammatory changes to suggest appendicitis are noted. Small bowel and stomach are within normal limits. Lymphatic: No lymphadenopathy is seen. Reproductive: Status post hysterectomy. No adnexal masses. Other: No abdominal wall hernia or abnormality. No abdominopelvic  ascites. Musculoskeletal: Degenerative changes of lumbar spine are noted. No acute abnormality noted. Review of the MIP images confirms the above findings. IMPRESSION: CTA of the chest: No evidence of aortic dissection. No evidence of pulmonary emboli is noted. CTA of the abdomen and pelvis: No arterial abnormality is noted. Diverticulosis without diverticulitis. Well distended gallbladder with evidence of cholelithiasis. Electronically Signed   By: Alcide Clever M.D.   On: 06/08/2023 21:55      Assessment/Plan Acute cholecystitis Elevated LFTs - Patient with acute onset upper abdominal pain, nausea, and vomiting yesterday. CT and u/s concerning for possible cholecystitis. She also has elevated LFTs with AST 253, ALT 136, alk phos 88, Tbili 2.3; and common bile duct 13mm on u/s. She is tender in epigastric region on abdominal exam. Repeat LFTs pending this morning. If trending up she may need MRCP +/- GI consult.  Given age and multiple comorbidities we would like to have cardiology see for preoperative risk stratification. Keep NPO for now and continue IV zosyn.  ID - zosyn VTE - SCDs, sq heparin FEN - IVF, NPO Foley - none  Muscular dystrophy - nearly wheelchair bound since May 2024 Pseudocholinesterase deficiency HTN PACs IBS  I reviewed hospitalist notes, last 24 h vitals and pain scores, last 48 h intake and output, last 24 h labs and trends, and last 24 h imaging results.   Franne Forts, PA-C [facility] 06/09/2023, 10:18 AM Please see Amion for pager number during day hours 7:00am-4:30pm

## 2023-06-09 NOTE — Progress Notes (Signed)
  Echocardiogram 2D Echocardiogram has been performed.  Catherine Harding 06/09/2023, 2:17 PM

## 2023-06-09 NOTE — ED Notes (Signed)
Patient resting quietly in stretcher, respirations even, unlabored, no acute distress noted. Denies needs at this time.  

## 2023-06-09 NOTE — H&P (Addendum)
History and Physical    Catherine Harding ZOX:[id number] DOB: 02/13/1930 DOA: 06/08/2023  PCP: Adrian Prince, MD  Patient coming from: [facility]  I have personally briefly reviewed patient's old medical records in [facility]  Chief Complaint: n/v abdominal pain x hour PTA  HPI: Catherine Harding is a 89 y.o. female with medical history significant of muscular dystrophy ,OA,GERD, HTN, pseudocholinesterace deficiency who presents from [facility] with complaint of n/v abdominal pain that started hour PTA. ON evaluation at [facility] patient was found to have Acute cholecystitis. Per patient states he pain started after she had eaten lunch. She states pain was located in epigastrium. She notes  she also noted excessive distention.  She states after having episode of vomiting her pain was some what relieved. Currently patient notes her pain in much improved although she does not her abdomen is tender. She also states she is not experiencing pain in her lower right quadrant. However overall she seems improved. She notes no fever/chills/ chest pain and no current n/v . She notes she has chronic diarrhea which is unchanged from baseline. She also note chronic b/l lower extremity swelling left > right for which she has followed with the vein center in the past. She notes that she is currently using a wheel chair for mobility due to history of muscular dystrophy.   ED Course:  Afeb, BP 169/86, HR 106, rr 19, sat 99%  Labs: Wbc 10, hgb 14.3, plt 235 Lipase 19 Na 137, K 3.8, Cl 98, glu 206,  AST 253, Alt 136, Tbili 2.3 CE10 Lactic 4.7 EKG: Sinus tachycardia wit 1st degree AV block  LAD , new RBBB  U/S RUQ MPRESSION: 1. Cholelithiasis with equivocal findings for acute cholecystitis. A hepatobiliary scintigraphy may provide better evaluation of the gallbladder if there is a high clinical concern for acute cholecystitis . 2. Fatty liver.   CT MPRESSION: CTA of the chest: No evidence of aortic dissection.  No evidence of pulmonary emboli is noted.   CTA of the abdomen and pelvis: No arterial abnormality is noted.   Diverticulosis without diverticulitis.   Well distended gallbladder with evidence of cholelithiasis.   Tx zosyn , ns   Review of Systems: As per HPI otherwise 10 point review of systems negative.   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   Macular hole of left eye    Muscular dystrophy (HCC)    Osteoporosis    pelvic fracture   Pseudocholinesterase deficiency    Scoliosis     Past Surgical History:  Procedure Laterality Date   ABDOMINAL HYSTERECTOMY     TAH BSO   BREAST LUMPECTOMY  1971   benign   CESAREAN SECTION     x2   COLPOSCOPY     EYE SURGERY  2003   left   TOTAL ABDOMINAL HYSTERECTOMY W/ BILATERAL SALPINGOOPHORECTOMY  1981   TRIGGER FINGER RELEASE  07/28/2012   Procedure: RELEASE TRIGGER FINGER/A-1 PULLEY;  Surgeon: Wyn Forster., MD;  Location: [facility];  Service: Orthopedics;  Laterality: Right;  EXCISION CYST RIGHT LONG A-1 RELEASE A-1 RIGHT  LONG      reports that she has never smoked. She has never used smokeless tobacco. She reports that she does not drink alcohol and does not use drugs.  Allergies  Allergen Reactions   Anesthetics, Amide    Ciprofloxacin Swelling   Celebrex [Celecoxib]    Sulfa Antibiotics    Codeine Nausea And Vomiting   Morphine And Codeine Nausea And Vomiting    Family History  Problem Relation Age of Onset   Hypertension Sister     Prior to Admission medications   Medication Sig Start Date End Date Taking? Authorizing Provider  Artificial Tear Solution (SYSTANE  CONTACTS) SOLN Apply to eye.    [provider]  bacitracin ophthalmic ointment Place 500 application into both eyes 3 (three) times daily. 04/19/13   [provider]  Calcium Carbonate Antacid (TUMS PO) Take by mouth daily as needed. chewable    [provider]  Calcium Carbonate-Vit D-Min (CALTRATE PLUS PO) Take by mouth.      [provider]  denosumab (PROLIA) 60 MG/ML SOLN injection Inject 60 mg into the skin every 6 (six) months. Administer in upper arm, thigh, or abdomen    [provider]  diltiazem (CARDIZEM CD) 240 MG 24 hr capsule Take 240 mg by mouth daily.      [provider]  ergocalciferol (VITAMIN D2) 50000 UNITS capsule Take 50,000 Units by mouth 2 (two) times a week.     [provider]  famotidine (PEPCID) 20 MG tablet Take 20 mg by mouth 2 (two) times daily.    [provider]  fluocinonide (LIDEX) 0.05 % external solution Apply topically. 07/09/21   [provider]  fluticasone Aleda Grana) 50 MCG/ACT nasal spray  01/08/20   [provider]  furosemide (LASIX) 20 MG tablet Take 20 mg by mouth 3 (three) times daily. Patient states she takes 2 tablets in the morning and 1 tablet at 2pm each day.    [provider]  hyoscyamine (LEVSIN SL) 0.125 MG SL tablet  06/12/19   [provider]  ketoconazole (NIZORAL) 2 % cream SMARTSIG:1 Topical Daily PRN 07/09/21   [provider]  losartan-hydrochlorothiazide (HYZAAR) 100-12.5 MG per tablet Take 12.5 tablets by mouth daily. 06/12/13   [provider]  Multiple Vitamins-Minerals (PRESERVISION/LUTEIN) CAPS Take 2 capsules by mouth daily.    [provider]  Omega-3 Fatty Acids (FISH OIL) 1200 MG CAPS Take 2 capsules by mouth daily.    [provider]  Probiotic Product (PROBIOTIC PO) Take 1 Dose by mouth daily.    [provider]  sodium chloride (OCEAN) 0.65 % nasal spray Place 1 spray into the  nose as needed for congestion.    [provider]  triamcinolone cream (KENALOG) 0.1 % SMARTSIG:1 Application Topical 2-3 Times Daily 01/08/20   [provider]    Physical Exam: Vitals:   06/09/23 0500 06/09/23 0600 06/09/23 0700 06/09/23 0752  BP: (!) 112/54 (!) 115/49 (!) 114/53   Pulse: 72 78 76   Resp: (!) 29 (!) 27 20   Temp:    98.7 F (37.1 C)  TempSrc:    Oral  SpO2: 90% (!) 89% 92%   Weight:      Height:        Constitutional: NAD, calm, comfortable Vitals:   06/09/23 0500 06/09/23 0600 06/09/23 0700 06/09/23 0752  BP: (!) 112/54 (!) 115/49 (!) 114/53   Pulse: 72 78 76   Resp: (!) 29 (!)  27 20   Temp:    98.7 F (37.1 C)  TempSrc:    Oral  SpO2: 90% (!) 89% 92%   Weight:      Height:       Eyes: PERRL, lids and conjunctivae normal ENMT: Mucous membranes are moist. Posterior pharynx clear of any exudate or lesions.Normal dentition.  Neck: normal, supple, no masses, no thyromegaly Respiratory: clear to auscultation bilaterally, no wheezing, no crackles. Normal respiratory effort. No accessory muscle use.  Cardiovascular: Regular rate and rhythm, no murmurs / rubs / gallops. +extremity edema. 2+ pedal pulses. No carotid bruits.  Abdomen: +RUQ/epigastric, Right lower quad tenderness, no masses palpated. No hepatosplenomegaly. Bowel sounds positive.  Musculoskeletal: no clubbing / cyanosis. No joint deformity upper and lower extremities. Good ROM, no contractures. Normal muscle tone.  Skin: mild redness of left lower extremity, lesions, ulcers. No induration Neurologic: CN 2-12 grossly intact. Sensation intact,. Strength 5/5 in all 4.  Psychiatric: Normal judgment and insight. Alert and oriented x 3. Normal mood.    Labs on Admission: I have personally reviewed following labs and imaging studies  CBC: Recent Labs  Lab 06/08/23 1922  WBC 10.0  HGB 14.3  HCT 42.2  MCV 97.5  PLT 235   Basic Metabolic Panel: Recent Labs  Lab 06/08/23 1922   NA 137  K 3.8  CL 98  CO2 24  GLUCOSE 206*  BUN 13  CREATININE 0.72  CALCIUM 10.3   GFR: Estimated Creatinine Clearance: 41.2 mL/min (by C-G formula based on SCr of 0.72 mg/dL). Liver Function Tests: Recent Labs  Lab 06/08/23 1922  AST 253*  ALT 136*  ALKPHOS 88  BILITOT 2.3*  PROT 7.6  ALBUMIN 4.8   Recent Labs  Lab 06/08/23 1922  LIPASE 19   No results for input(s): "AMMONIA" in the last 168 hours. Coagulation Profile: No results for input(s): "INR", "PROTIME" in the last 168 hours. Cardiac Enzymes: No results for input(s): "CKTOTAL", "CKMB", "CKMBINDEX", "TROPONINI" in the last 168 hours. BNP (last 3 results) No results for input(s): "PROBNP" in the last 8760 hours. HbA1C: No results for input(s): "HGBA1C" in the last 72 hours. CBG: No results for input(s): "GLUCAP" in the last 168 hours. Lipid Profile: No results for input(s): "CHOL", "HDL", "LDLCALC", "TRIG", "CHOLHDL", "LDLDIRECT" in the last 72 hours. Thyroid Function Tests: No results for input(s): "TSH", "T4TOTAL", "FREET4", "T3FREE", "THYROIDAB" in the last 72 hours. Anemia Panel: No results for input(s): "VITAMINB12", "FOLATE", "FERRITIN", "TIBC", "IRON", "RETICCTPCT" in the last 72 hours. Urine analysis: No results found for: "COLORURINE", "APPEARANCEUR", "LABSPEC", "PHURINE", "GLUCOSEU", "HGBUR", "BILIRUBINUR", "KETONESUR", "PROTEINUR", "UROBILINOGEN", "NITRITE", "LEUKOCYTESUR"  Radiological Exams on Admission: US Abdomen Limited RUQ (LIVER/GB)  Result Date: 06/08/2023 CLINICAL DATA:  Epigastric pain, nausea vomiting. EXAM: ULTRASOUND ABDOMEN LIMITED RIGHT UPPER QUADRANT COMPARISON:  CT dated 06/08/2023. FINDINGS: Gallbladder: There multiple stones within the gallbladder. The gallbladder wall is slightly thickened measuring 5 mm. There is a small pericholecystic fluid. Negative sonographic Murphy's sign. Common bile duct: Diameter: 13 mm. Liver: There is diffuse increased liver echogenicity most  commonly seen in the setting of fatty infiltration. Superimposed inflammation or fibrosis is not excluded. Clinical correlation is recommended. Portal vein is patent on color Doppler imaging with normal direction of blood flow towards the liver. Other: None. IMPRESSION: 1. Cholelithiasis with equivocal findings for acute cholecystitis. A hepatobiliary scintigraphy may provide better evaluation of the gallbladder if there is a high clinical concern for acute cholecystitis . 2. Fatty liver. Electronically Signed   By:  Elgie Collard M.D.   On: 06/08/2023 22:23   CT Angio Chest/Abd/Pel for Dissection W and/or Wo Contrast  Result Date: 06/08/2023 CLINICAL DATA:  Chest and abdominal pain with hypertension, initial encounter EXAM: CT ANGIOGRAPHY CHEST, ABDOMEN AND PELVIS TECHNIQUE: Non-contrast CT of the chest was initially obtained. Multidetector CT imaging through the chest, abdomen and pelvis was performed using the standard protocol during bolus administration of intravenous contrast. Multiplanar reconstructed images and MIPs were obtained and reviewed to evaluate the vascular anatomy. RADIATION DOSE REDUCTION: This exam was performed according to the departmental dose-optimization program which includes automated exposure control, adjustment of the mA and/or kV according to patient size and/or use of iterative reconstruction technique. CONTRAST:  OMNIPAQUE IOHEXOL 350 MG/ML SOLN COMPARISON:  03/23/2023 FINDINGS: CTA CHEST FINDINGS Cardiovascular: Initial precontrast images show atherosclerotic calcification of the thoracic aorta. No hyperdense crescent to suggest acute aortic injury is seen. No aneurysmal dilatation of the aorta is noted on postcontrast images. No dissection is seen. Mild soft atherosclerotic plaque is noted in the distal descending thoracic aorta. The pulmonary artery shows a normal branching pattern bilaterally. No intraluminal filling defect to suggest pulmonary embolism is seen.  Coronary calcifications are noted. Mediastinum/Nodes: Thoracic inlet is within normal limits. No hilar or mediastinal adenopathy is noted. The esophagus as visualized is within normal limits. Lungs/Pleura: Lungs are well aerated bilaterally. No focal infiltrate or sizable effusion is seen. Musculoskeletal: Degenerative changes of the thoracic spine are noted. No acute rib abnormality is noted. Old rib fractures are seen bilaterally with healing. Review of the MIP images confirms the above findings. CTA ABDOMEN AND PELVIS FINDINGS VASCULAR Aorta: Atherosclerotic calcifications are noted. No aneurysmal dilatation is seen. Celiac: Patent without evidence of aneurysm, dissection, vasculitis or significant stenosis. SMA: Patent without evidence of aneurysm, dissection, vasculitis or significant stenosis. Renals: Both renal arteries are patent without evidence of aneurysm, dissection, vasculitis, fibromuscular dysplasia or significant stenosis. IMA: Patent without evidence of aneurysm, dissection, vasculitis or significant stenosis. Inflow: Iliacs demonstrate atherosclerotic calcification without stenosis or aneurysmal dilatation. Veins: No specific venous abnormality is noted. Review of the MIP images confirms the above findings. NON-VASCULAR Hepatobiliary: Liver is within normal limits. Gallbladder is significantly distended with dependent density consistent with cholelithiasis. No biliary ductal dilatation is seen. Pancreas: Unremarkable. No pancreatic ductal dilatation or surrounding inflammatory changes. Spleen: Normal in size without focal abnormality. Adrenals/Urinary Tract: Adrenal glands are within normal limits. Kidneys demonstrate a normal enhancement pattern bilaterally. No renal calculi or obstructive changes are seen. The bladder is decompressed. Stomach/Bowel: Scattered diverticular change of the colon is noted without evidence of diverticulitis. The appendix is not well visualized. No inflammatory changes  to suggest appendicitis are noted. Small bowel and stomach are within normal limits. Lymphatic: No lymphadenopathy is seen. Reproductive: Status post hysterectomy. No adnexal masses. Other: No abdominal wall hernia or abnormality. No abdominopelvic ascites. Musculoskeletal: Degenerative changes of lumbar spine are noted. No acute abnormality noted. Review of the MIP images confirms the above findings. IMPRESSION: CTA of the chest: No evidence of aortic dissection. No evidence of pulmonary emboli is noted. CTA of the abdomen and pelvis: No arterial abnormality is noted. Diverticulosis without diverticulitis. Well distended gallbladder with evidence of cholelithiasis. Electronically Signed   By: Alcide Clever M.D.   On: 06/08/2023 21:55    EKG: Independently reviewed.  Assessment/Plan  Acute Cholecystits -suggestive on CT abd /RUQ u/s with distended gall bladder  -continue npo status  -continue on zosyn  - supportive pain medication  and ivfs -Dr Cliffton Asters from surgery consulted per EDP ,awaiting recs   Hx of Muscular Dystrophy -followed by neurology  -wheel chair for mobility   Left lower extremity erythema/swelling -per family has swelling and sometimes redness  on left >right -presumed related to hx of venostasis  - will however evaluate with u/s   GERD -ppi   Hypertension  - currently stable  -resume as able   Hx of RBBB Hx of PAC/PVC Low risk stress completed 2023  Echo 2023 with normal EF  - cardiology consulted for consulted for cardiac clearance -bnp, echo pending    DVT prophylaxis: scd Code Status: full/ as discussed per patient wishes in event of cardiac arrest  Family Communication: Stolz,Steven (Son) [phone] (Mobile)  Disposition Plan: patient  expected to be admitted greater than 2 midnights  Consults called: Gen Surgery Dr Cliffton Asters, Dr Odis [facility]head Cardiology Admission status:  med tele   Lurline Del MD [facility]   If 7PM-7AM, please contact  night-coverage [email or web address] Password [facility]  06/09/2023, 8:47 AM

## 2023-06-09 NOTE — Plan of Care (Signed)

## 2023-06-09 NOTE — Progress Notes (Signed)
Heart Failure Navigator Progress Note  Assessed for Heart & Vascular TOC clinic readiness.  Patient does not meet criteria due to been established patient with [facility].   Navigator will sign off at this time.  Maekayla Giorgio,RN, BSN,MSN Heart Failure Nurse Navigator. Contact by secure chat only.

## 2023-06-09 NOTE — Consult Note (Signed)
CARDIOLOGY CONSULT NOTE  Patient ID: Catherine Harding MRN: [id number] DOB/AGE: [id number] 87 y.o.  Admit date: 06/08/2023 Attending physician: Lurline Del, MD Primary Physician:  Adrian Prince, MD Outpatient Cardiology Provider: Dr. Clotilde Dieter Inpatient Cardiologist: Tessa Lerner, DO, [facility]  Reason of consultation: Preoperative risk stratification Referring physician: Lurline Del, MD and Carlena Bjornstad PA-C  Chief complaint: abdominal pain   HPI:  Catherine Harding is a 89 y.o. Caucasian female who presents with a chief complaint of " abdominal pain." Her past medical history and cardiovascular risk factors include: IBS-D, PSVT, Muscular dystrophy, HTN, HLD.  Presents to the hospital with a chief complaint of abdominal pain.  She reported severe upper abdominal pain and nausea with episodes of emesis in the emergency room department.  Workup thus far as noted cholelithiasis with concerns for acute cholecystitis.  Cardiology is consulted for preoperative risk stratification and she is being considered for laparoscopic cholecystectomy versus IR guided percutaneous cholecystostomy.  At the time of evaluation patient is accompanied by her oldest son Viviann Spare at bedside.  Patient denies anginal chest pain or heart failure symptoms. She is a nondiabetic. No prior history of ACS, no significant valvular heart disease, no CVA/TIA, no prior history of DVT/PE. Most recent serum creatinine 0.7 mg/dL. High sensitive troponins are negative x 2. Functional capacity gradually reduced secondary to underlying muscular dystrophy.  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   Macular hole of left eye    Muscular dystrophy (HCC)    Osteoporosis    pelvic fracture   Pseudocholinesterase deficiency    Scoliosis     PAST SURGICAL HISTORY: Past Surgical History:  Procedure Laterality Date   ABDOMINAL HYSTERECTOMY     TAH BSO   BREAST LUMPECTOMY  1971   benign   CESAREAN SECTION     x2   COLPOSCOPY     EYE SURGERY  2003   left   TOTAL ABDOMINAL HYSTERECTOMY W/ BILATERAL SALPINGOOPHORECTOMY  1981   TRIGGER FINGER RELEASE  07/28/2012   Procedure: RELEASE TRIGGER FINGER/A-1 PULLEY;  Surgeon: Wyn Forster., MD;  Location: [facility];  Service: Orthopedics;  Laterality: Right;  EXCISION CYST RIGHT LONG A-1 RELEASE A-1 RIGHT LONG     FAMILY HISTORY: The patient's family history includes Hypertension in her sister.   SOCIAL HISTORY:  The patient  reports that she has never smoked. She has never used smokeless tobacco. She reports that she does not drink alcohol and does not use drugs.  MEDICATIONS: Current Outpatient Medications  Medication Instructions   Artificial Tear Solution (SYSTANE CONTACTS) SOLN Ophthalmic   Calcium Carbonate Antacid (TUMS PO) Oral, Daily PRN, chewable    Calcium Carbonate-Vit D-Min (CALTRATE PLUS PO) Oral,     denosumab (PROLIA) 60 mg, Subcutaneous, Every 6 months, Administer in upper arm, thigh, or abdomen  diltiazem (CARDIZEM CD) 240 mg, Oral, Daily,     ergocalciferol (VITAMIN D2) 50,000 Units, Oral, 2 times weekly,     fluticasone (FLONASE) 50 MCG/ACT nasal spray 1-2 sprays, Each Nare, Daily   furosemide (LASIX) 20 mg, Oral, 3 times daily, Patient states she takes 2 tablets in the morning and 1 tablet at 2pm each day.   montelukast (SINGULAIR)  10 mg, Oral, Daily   Multiple Vitamins-Minerals (PRESERVISION/LUTEIN) CAPS 2 capsules, Oral, Daily   Omega-3 Fatty Acids (FISH OIL) 1200 MG CAPS 2 capsules, Oral, Daily   pantoprazole (PROTONIX) 20 mg, Oral, 2 times daily   predniSONE (STERAPRED UNI-PAK 21 TAB) 10 MG (21) TBPK tablet 1 tablet, Oral, See admin instructions, Patient hasn't started medication.    Probiotic Product (PROBIOTIC PO) 1 Dose, Oral, Daily    REVIEW OF SYSTEMS: Review of Systems  Cardiovascular:  Negative for chest pain, claudication, dyspnea on exertion, irregular heartbeat, leg swelling, near-syncope, orthopnea, palpitations, paroxysmal nocturnal dyspnea and syncope.  Respiratory:  Negative for shortness of breath.   Hematologic/Lymphatic: Negative for bleeding problem.  Musculoskeletal:  Negative for muscle cramps and myalgias.  Gastrointestinal:  Positive for abdominal pain (improved).  Neurological:  Negative for dizziness and light-headedness.    PHYSICAL EXAMINATION: PHYSICAL EXAM: Temp:  [98.4 F (36.9 C)-98.8 F (37.1 C)] 98.8 F (37.1 C) (08/29 0840) Pulse Rate:  [70-106] 74 (08/29 1129) Resp:  [12-29] 20 (08/29 1129) BP: (110-169)/(49-86) 144/62 (08/29 1129) SpO2:  [89 %-99 %] 92 % (08/29 1129) Weight:  [68 kg-70.2 kg] 70.2 kg (08/29 1800)  Intake/Output: No intake or output data in the 24 hours ending 06/09/23 1900   Net IO Since Admission: No IO data has been entered for this period [06/09/23 1900]  Weights:     06/09/2023    6:00 PM 06/08/2023    7:15 PM 05/04/2023    2:41 PM  Last 3 Weights  Weight (lbs) 154 lb 12.2 oz 150 lb --  Weight (kg) 70.2 kg 68.04 kg --     Physical Exam  Constitutional: No distress.  Age appropriate, hemodynamically stable.   Neck: No JVD present.  Cardiovascular: Normal rate, regular rhythm, S1 normal, S2 normal, intact distal pulses and normal pulses. Exam reveals no gallop, no S3 and no S4.  No murmur heard. Pulmonary/Chest: Effort normal and breath  sounds normal. No stridor. She has no wheezes. She has no rales.  Abdominal:  Soft, nontender, nondistended, hypoactive bowel sounds  Musculoskeletal:        General: Edema (Warm bilateral extremities, left lower extremity swollen compared to the right (chronic)) present.     Cervical back: Neck supple.  Neurological: She is alert and oriented to person, place, and time. She has intact cranial nerves (2-12).  Skin: Skin is warm and moist.    LAB RESULTS: Chemistry Recent Labs  Lab 06/08/23 1922 06/09/23 1226  NA 137 137  K 3.8 3.3*  CL 98 106  CO2 24 22  GLUCOSE 206* 120*  BUN 13 8  CREATININE 0.72 0.73  CALCIUM 10.3 8.5*  PROT 7.6 5.6*  ALBUMIN 4.8 3.1*  AST 253* 384*  ALT 136* 317*  ALKPHOS 88 98  BILITOT 2.3* 3.7*  GFRNONAA >60 >60  ANIONGAP 15 9    Hematology Recent Labs  Lab 06/08/23 1922 06/09/23 1226  WBC 10.0 10.1  RBC 4.33 3.43*  HGB 14.3 11.2*  HCT 42.2 33.0*  MCV 97.5 96.2  MCH 33.0 32.7  MCHC 33.9 33.9  RDW 12.6 12.8  PLT 235 180   High Sensitivity Troponin:   Recent Labs  Lab 06/08/23 1922 06/08/23 2143  TROPONINIHS 10 11     Cardiac EnzymesNo results for input(s): "TROPONINI" in the last 168 hours. No results for input(s): "TROPIPOC" in the last 168 hours.  BNP Recent Labs  Lab 06/09/23 1226  BNP 345.2*    DDimer No results for input(s): "DDIMER" in the last 168 hours.  Hemoglobin A1c: No results found for: "HGBA1C", "MPG" TSH No results for input(s): "TSH" in the last 8760 hours. Lipid Panel No results found for: "CHOL", "HDL", "LDLCALC", "LDLDIRECT", "TRIG", "CHOLHDL" Drugs of Abuse  No results found for: "LABOPIA", "COCAINSCRNUR", "LABBENZ", "AMPHETMU", "THCU", "LABBARB"    RADIOLOGY: CTA of the chest 06/08/2023 CTA of the chest: No evidence of aortic dissection. No evidence of pulmonary emboli is noted. CTA of the abdomen and pelvis: No arterial abnormality is noted. Diverticulosis without diverticulitis. Well distended  gallbladder with evidence of cholelithiasis.   Ultrasound of the abdomen 06/08/2023: 1. Cholelithiasis with equivocal findings for acute cholecystitis. A hepatobiliary scintigraphy may provide better evaluation of the gallbladder if there is a high clinical concern for acute cholecystitis . 2. Fatty liver.   CARDIAC DATABASE: EKG: 09/10/2022: Sinus rhythm, 84 bpm, right bundle branch block.  06/08/2023: Sinus tachycardia, first-degree AV block, left axis, right bundle branch block, without underlying injury pattern.  Echocardiogram: 06/09/2023  1. Left ventricular ejection fraction, by estimation, is 60 to 65%. The  left ventricle has normal function. The left ventricle has no regional  wall motion abnormalities. There is mild left ventricular hypertrophy.  Left ventricular diastolic parameters  are indeterminate.   2. Right ventricular systolic function is normal. The right ventricular  size is normal. There is normal pulmonary artery systolic pressure.   3. Left atrial size was mildly dilated.   4. The mitral valve is degenerative. No evidence of mitral valve  regurgitation. No evidence of mitral stenosis.   5. The aortic valve is tricuspid. Aortic valve regurgitation is not  visualized. Aortic valve sclerosis is present, with no evidence of aortic  valve stenosis.   6. The inferior vena cava is dilated in size with >50% respiratory  variability, suggesting right atrial pressure of 8 mmHg.   7. Rhythm strip during this exam demonstrates normal sinus rhythm.   Stress Testing:  Lexiscan Sestamibi stress test 08/05/2021: Lexiscan nuclear stress test performed using 1-day protocol. Normal myocardial perfusion. Stress LVEF 67%. Low risk study.   Scheduled Meds:  [START ON 06/10/2023] heparin  5,000 Units Subcutaneous Q8H    Continuous Infusions:  sodium chloride 75 mL/hr at 06/09/23 0920   piperacillin-tazobactam (ZOSYN)  IV 3.375 g (06/09/23 1712)    PRN Meds: albuterol,  HYDROmorphone (DILAUDID) injection, iohexol, ondansetron **OR** ondansetron (ZOFRAN) IV  IMPRESSION & RECOMMENDATIONS: JERUSHA LAMOREE is a 89 y.o. Caucasian female whose past medical history and cardiovascular risk factors include: IBS-D, PSVT, Muscular dystrophy, HTN, HLD.  Impression:  Preoperative risk stratification Acute cholecystitis PSVT Known right bundle branch block. Hypertension. IBS-D Muscular dystrophy  Plan:  Preoperative risk stratification Patient presents to the hospital with abdominal pain and emesis with working diagnosis of acute cholecystitis.  She is being considered for laparoscopic cholecystectomy versus IR guided percutaneous cholecystostomy.  Cardiology was asked to provide preoperative risk stratification for upcoming noncardiac surgery.  Patient denies anginal chest pain or heart failure symptoms EKG is sinus rhythm with right bundle branch block without underlying injury pattern High sensitive troponins are negative x 2  Telemetry does not illustrate dysrhythmias No history of ACS/CVA/TIA/PE/DVT. Echocardiogram today notes preserved LVEF without any significant valvular heart disease. Last stress test was low risk.  She is a nondiabetic. Her renal function is acceptable for age as discussed above. Her functional capacity is limited due to her underlying muscular dystrophy and not due to cardiac / exertional symptoms.   From a cardiology standpoint she is optimized for either upcoming noncardiac surgery/procedure.  She is considered to be acceptable risk for either the upcoming noncardiac surgery/procedure.  Patient and son accept the preoperative risk assessment.  I defer to the surgeon / proceduralist to assess the inherent procedural risk, expected benefit, and possible alternatives as the final decision to proceed with either the surgery or procedure will be ultimately at the discretion of the patient and family after understanding the risks,  benefits, and alternatives as part of informed consent.  Acute cholecystitis: Management per primary and surgical team.  History of PSVT: Recommend restarting home dose calcium channel blocker.  Known right bundle branch block: Normal variant.  No additional workup.  No additional workup is warranted from a cardiology standpoint at this time.  We are available if questions or concerns arise.  Thank you for allowing [place] to participate in the care of SHIRLENA FUJIMOTO please reach out if any questions or concerns arise.  Otherwise we will follow peripherally.  Total encounter time 60 minutes. *Total Encounter Time as defined by the Centers for Medicare and Medicaid Services includes, in addition to the face-to-face time of a patient visit (documented in the note above) non-face-to-face time: obtaining and reviewing outside history, ordering and reviewing medications, tests or procedures, care coordination (communications with other health care professionals or caregivers) and documentation in the medical record.  Patient's questions and concerns were addressed to her satisfaction. She voices understanding of the instructions provided during this encounter.   This note was created using a voice recognition software as a result there may be grammatical errors inadvertently enclosed that do not reflect the nature of this encounter. Every attempt is made to correct such errors.  Delilah Shan [facility]  Pager:  [phone] Office: [phone] 06/09/2023, 7:00 PM

## 2023-06-09 NOTE — Progress Notes (Signed)
Patient allergy: Atypical pseudocholinesterase deficiency.

## 2023-06-10 DIAGNOSIS — R1011 Right upper quadrant pain: Secondary | ICD-10-CM

## 2023-06-10 DIAGNOSIS — R748 Abnormal levels of other serum enzymes: Secondary | ICD-10-CM | POA: Diagnosis not present

## 2023-06-10 DIAGNOSIS — K810 Acute cholecystitis: Secondary | ICD-10-CM | POA: Diagnosis not present

## 2023-06-10 LAB — CBC
HCT: 32.3 % — ABNORMAL LOW (ref 36.0–46.0)
Hemoglobin: 10.8 g/dL — ABNORMAL LOW (ref 12.0–15.0)
MCH: 32.2 pg (ref 26.0–34.0)
MCHC: 33.4 g/dL (ref 30.0–36.0)
MCV: 96.4 fL (ref 80.0–100.0)
Platelets: 152 10*3/uL (ref 150–400)
RBC: 3.35 MIL/uL — ABNORMAL LOW (ref 3.87–5.11)
RDW: 12.8 % (ref 11.5–15.5)
WBC: 6.2 10*3/uL (ref 4.0–10.5)
nRBC: 0 % (ref 0.0–0.2)

## 2023-06-10 LAB — COMPREHENSIVE METABOLIC PANEL
ALT: 232 U/L — ABNORMAL HIGH (ref 0–44)
AST: 187 U/L — ABNORMAL HIGH (ref 15–41)
Albumin: 2.9 g/dL — ABNORMAL LOW (ref 3.5–5.0)
Alkaline Phosphatase: 100 U/L (ref 38–126)
Anion gap: 9 (ref 5–15)
BUN: 7 mg/dL — ABNORMAL LOW (ref 8–23)
CO2: 20 mmol/L — ABNORMAL LOW (ref 22–32)
Calcium: 8.3 mg/dL — ABNORMAL LOW (ref 8.9–10.3)
Chloride: 107 mmol/L (ref 98–111)
Creatinine, Ser: 0.77 mg/dL (ref 0.44–1.00)
GFR, Estimated: >60 mL/min (ref >60)
Glucose, Bld: 131 mg/dL — ABNORMAL HIGH (ref 70–99)
Potassium: 3 mmol/L — ABNORMAL LOW (ref 3.5–5.1)
Sodium: 136 mmol/L (ref 135–145)
Total Bilirubin: 4.6 mg/dL — ABNORMAL HIGH (ref 0.3–1.2)
Total Protein: 5.4 g/dL — ABNORMAL LOW (ref 6.5–8.1)

## 2023-06-10 MED ORDER — POTASSIUM CHLORIDE 10 MEQ/100ML IV SOLN
10.0000 meq | Freq: Once | INTRAVENOUS | Status: AC
  Administered 2023-06-10: 10 meq via INTRAVENOUS

## 2023-06-10 MED ORDER — POTASSIUM CHLORIDE 10 MEQ/100ML IV SOLN
10.0000 meq | INTRAVENOUS | Status: AC
  Administered 2023-06-10 (×3): 10 meq via INTRAVENOUS
  Filled 2023-06-10 (×2): qty 100

## 2023-06-10 MED ORDER — POTASSIUM CHLORIDE 20 MEQ PO PACK: 60.0000 meq | PACK | Freq: Once | ORAL | Status: DC

## 2023-06-10 NOTE — Consult Note (Signed)
[facility] Gastroenterology Consult  Referring Provider: No ref. provider found Primary Care Physician:  Adrian Prince, MD Primary Gastroenterologist: Gentry Fitz, previously seen by Dr. Dulce Sellar with [facility], though has seen GI provider at [facility] in June 2024  Reason for Consultation: hyperbilirubinemia, concern for choledocholithiasis  SUBJECTIVE:   HPI: Catherine Harding is a 89 y.o. female with past medical history significant for irritable bowel syndrome diarrhea predominant, GERD, hypertension, osteoporosis. She presented to hospital on 06/09/23 with chief complaint of abdominal pain.   She noted that she has been experiencing intermittent RUQ abdominal pain over the past few months. She had episode on day of admission when she had significant RUQ pain after eating. She has dyspnea, nausea and vomiting during episode. She noted having chronic chills prior to admission, no fevers. No blood per rectum. Her abdominal pain radiates to her right lower extremity. No anticoagulant use. No previous gastric bypass.  On presentation, had CTA of chest/abdomen and pelvis which showed significantly distended gallbladder with dependent density consistent with cholelithiasis, no ductal dilatation seen. Abdominal ultrasound showed findings equivocal for acute cholecystitis. She was evaluated by General Surgery for possible acute cholecystitis. Cardiology was consulted for pre-operative risk assessment and she was determined to be an appropriate candidate. Her liver enzyme panel has since showed increase in total bilirubin. An MRCP was ordered and showed dilated common bile duct 13 mm with mild tapering at the ampulla, no definitive choledocholithiasis was seen, though could not exclude a non-visualized distal CBD stone. GI was consulted. Upon conversation with General Surgery, cholecystectomy is not being offered to patient, a percutaneous cholecystostomy tube is not being considered.   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   Macular hole of left eye    Muscular dystrophy (HCC)    Osteoporosis    pelvic fracture   Pseudocholinesterase deficiency    Scoliosis    Past Surgical History:  Procedure Laterality Date   ABDOMINAL HYSTERECTOMY     TAH BSO   BREAST LUMPECTOMY  1971   benign   CESAREAN SECTION     x2   COLPOSCOPY     EYE SURGERY  2003   left   TOTAL ABDOMINAL HYSTERECTOMY W/ BILATERAL SALPINGOOPHORECTOMY  1981   TRIGGER FINGER RELEASE  07/28/2012   Procedure: RELEASE TRIGGER FINGER/A-1 PULLEY;  Surgeon: Wyn Forster., MD;  Location: [facility];  Service: Orthopedics;  Laterality: Right;  EXCISION CYST RIGHT LONG A-1 RELEASE A-1 RIGHT LONG    Prior to Admission medications   Medication Sig Start Date End Date Taking? Authorizing Provider  Artificial Tear Solution (SYSTANE CONTACTS) SOLN Apply to eye.   Yes [provider]  Calcium Carbonate Antacid (TUMS PO) Take by mouth daily as needed. chewable   Yes [provider]  Calcium Carbonate-Vit D-Min (CALTRATE PLUS PO) Take by mouth.     Yes [provider]  denosumab (PROLIA) 60 MG/ML SOLN injection Inject 60 mg into the skin every 6 (six) months. Administer in upper arm, thigh, or abdomen   Yes [provider]  diltiazem (CARDIZEM CD) 240 MG 24 hr  capsule Take 240 mg by mouth daily.     Yes [provider]  ergocalciferol (VITAMIN D2) 50000 UNITS capsule Take 50,000 Units by mouth 2 (two) times a week.    Yes [provider]  fluticasone (FLONASE) 50 MCG/ACT nasal spray Place 1-2 sprays into both nostrils daily. 01/08/20  Yes  [provider]  furosemide (LASIX) 20 MG tablet Take 20 mg by mouth 3 (three) times daily. Patient states she takes 2 tablets in the morning and 1 tablet at 2pm each day.   Yes [provider]  montelukast (SINGULAIR) 10 MG tablet Take 10 mg by mouth daily. 05/13/23  Yes [provider]  Multiple Vitamins-Minerals (PRESERVISION/LUTEIN) CAPS Take 2 capsules by mouth daily.   Yes [provider]  Omega-3 Fatty Acids (FISH OIL) 1200 MG CAPS Take 2 capsules by mouth daily.   Yes [provider]  pantoprazole (PROTONIX) 20 MG tablet Take 20 mg by mouth 2 (two) times daily. 06/02/23  Yes [provider]  predniSONE (STERAPRED UNI-PAK 21 TAB) 10 MG (21) TBPK tablet Take 1 tablet by mouth See admin instructions. Patient hasn't started medication. 06/07/23  Yes [provider]  Probiotic Product (PROBIOTIC PO) Take 1 Dose by mouth daily.   Yes [provider]   Current Facility-Administered Medications  Medication Dose Route Frequency Provider Last Rate Last Admin   albuterol (PROVENTIL) (2.5 MG/3ML) 0.083% nebulizer solution 2.5 mg  2.5 mg Nebulization Q2H PRN Lurline Del, MD       diltiazem (CARDIZEM CD) 24 hr capsule 240 mg  240 mg Oral Daily Skip Mayer A, MD   240 mg at 06/10/23 5188   heparin injection 5,000 Units  5,000 Units Subcutaneous Q8H Skip Mayer A, MD   5,000 Units at 06/10/23 0630   HYDROmorphone (DILAUDID) injection 0.5 mg  0.5 mg Intravenous Q3H PRN Lurline Del, MD       iohexol (OMNIPAQUE) 300 MG/ML solution 100 mL  100 mL Intravenous Once PRN Tegeler, Canary Brim, MD       ondansetron [facility]) tablet 4 mg  4 mg Oral Q6H PRN Lurline Del, MD       Or   ondansetron [facility]) injection 4 mg  4 mg Intravenous Q6H PRN Lurline Del, MD       piperacillin-tazobactam (ZOSYN) IVPB 3.375 g  3.375 g Intravenous Q8H Skip Mayer A, MD 12.5 mL/hr at 06/10/23 0813 3.375 g at 06/10/23  0813   potassium chloride 10 mEq in 100 mL IVPB  10 mEq Intravenous Q1 Hr x 4 Danford, Earl Lites, MD 100 mL/hr at 06/10/23 1009 10 mEq at 06/10/23 1009   Allergies as of 06/08/2023 - Review Complete 06/08/2023  Allergen Reaction Noted   Anesthetics, amide  07/04/2013   Celebrex [celecoxib]  09/08/2011   Ciprofloxacin Swelling 09/08/2011   Codeine Nausea And Vomiting 09/08/2011   Morphine and codeine Nausea And Vomiting 09/08/2011   Sulfa antibiotics  09/08/2011   Family History  Problem Relation Age of Onset   Hypertension Sister    Social History   Socioeconomic History   Marital status: Divorced    Spouse name: Not on file   Number of children: 3   Years of education: 13   Highest education level: Not on file  Occupational History   Occupation: retired    Comment: Haematologist at [employer]  Tobacco Use   Smoking status: Never   Smokeless tobacco: Never  Vaping Use   Vaping status:  Never Used  Substance and Sexual Activity   Alcohol use: No    Alcohol/week: 0.0 standard drinks of alcohol    Comment: rare   Drug use: No   Sexual activity: Never    Birth control/protection: Surgical  Other Topics Concern   Not on file  Social History Narrative   Patient lives at home alone and she is divorced. Patient is retired.   Right handed.   Caffeine- one cup daily.   Uses walker.   Grown children, live 7 miles away.     Social Determinants of Health   Financial Resource Strain: Not on file  Food Insecurity: No Food Insecurity (06/09/2023)   Hunger Vital Sign    Worried About Running Out of Food in the Last Year: Never true    Ran Out of Food in the Last Year: Never true  Transportation Needs: No Transportation Needs (06/09/2023)   PRAPARE - Administrator, Civil Service (Medical): No    Lack of Transportation (Non-Medical): No  Physical Activity: Not on file  Stress: Not on file  Social Connections: Not on file  Intimate Partner Violence: Not At  Risk (06/09/2023)   Humiliation, Afraid, Rape, and Kick questionnaire    Fear of Current or Ex-Partner: No    Emotionally Abused: No    Physically Abused: No    Sexually Abused: No   Review of Systems:  Review of Systems  Constitutional:  Positive for chills. Negative for fever.  Respiratory:  Negative for shortness of breath.   Cardiovascular:  Negative for chest pain.  Gastrointestinal:  Positive for abdominal pain, diarrhea, nausea and vomiting. Negative for blood in stool.    OBJECTIVE:   Temp:  [98 F (36.7 C)-98.4 F (36.9 C)] 98.3 F (36.8 C) (08/30 0727) Pulse Rate:  [74-75] 75 (08/30 0727) Resp:  [20] 20 (08/30 0727) BP: (144-163)/(62-82) 149/71 (08/30 0727) SpO2:  [91 %-94 %] 91 % (08/30 0727) Weight:  [70.2 kg-72.3 kg] 72.3 kg (08/30 0300)   Physical Exam Constitutional:      General: She is not in acute distress.    Appearance: She is not ill-appearing, toxic-appearing or diaphoretic.  Cardiovascular:     Rate and Rhythm: Normal rate and regular rhythm.  Pulmonary:     Effort: No respiratory distress.     Breath sounds: Normal breath sounds.  Abdominal:     General: Bowel sounds are normal. There is no distension.     Palpations: Abdomen is soft.     Tenderness: There is abdominal tenderness. There is no guarding.  Musculoskeletal:     Right lower leg: No edema.     Left lower leg: Edema (chronic per patient) present.  Skin:    General: Skin is warm and dry.  Neurological:     Mental Status: She is alert.     Labs: Recent Labs    06/08/23 1922 06/09/23 1226 06/10/23 0734  WBC 10.0 10.1 6.2  HGB 14.3 11.2* 10.8*  HCT 42.2 33.0* 32.3*  PLT 235 180 152   BMET Recent Labs    06/08/23 1922 06/09/23 1226 06/10/23 0734  NA 137 137 136  K 3.8 3.3* 3.0*  CL 98 106 107  CO2 24 22 20*  GLUCOSE 206* 120* 131*  BUN 13 8 7*  CREATININE 0.72 0.73 0.77  CALCIUM 10.3 8.5* 8.3*   LFT Recent Labs    06/10/23 0734  PROT 5.4*  ALBUMIN 2.9*  AST  187*  ALT 232*  ALKPHOS  100  BILITOT 4.6*   PT/INR No results for input(s): "LABPROT", "INR" in the last 72 hours.  Diagnostic imaging: MR ABDOMEN MRCP W WO CONTAST  Result Date: 06/10/2023 CLINICAL DATA:  Abdominal pain, nausea, diarrhea EXAM: MRI ABDOMEN WITHOUT AND WITH CONTRAST (INCLUDING MRCP) TECHNIQUE: Multiplanar multisequence MR imaging of the abdomen was performed both before and after the administration of intravenous contrast. Heavily T2-weighted images of the biliary and pancreatic ducts were obtained, and three-dimensional MRCP images were rendered by post processing. CONTRAST:  7mL GADAVIST GADOBUTROL 1 MMOL/ML IV SOLN COMPARISON:  CTA abdomen/pelvis dated 06/08/2023. CT abdomen/pelvis dated 05/13/2017. FINDINGS: Lower chest: Trace bilateral pleural effusions. Hepatobiliary: Mild hepatic steatosis. No suspicious/enhancing hepatic lesions. Gallbladder is notable for layering small gallstones (series 3/image 26), without associated inflammatory changes. Mild central intrahepatic ductal prominence. Common duct measures 13 mm centrally and mildly tapers at the ampulla. No choledocholithiasis is seen. Pancreas: Segmental ductal dilatation with side branch ectasia in the distal pancreatic tail (series 3/image 17). In retrospect, this appearance chronic dating back to 2018. No associated obstructing pancreatic mass is evident on MR. Spleen:  Within normal limits. Adrenals/Urinary Tract:  Adrenal glands are within normal limits. Two left renal cysts measuring up to 5 mm, benign. Right kidney is within normal limits. No hydronephrosis. Stomach/Bowel: Stomach is within normal limits. Visualized bowel is notable for scattered colonic diverticulosis, without evidence of diverticulitis. Vascular/Lymphatic:  No evidence of abdominal aortic aneurysm. No suspicious abdominal lymphadenopathy. Other:  No abdominal ascites. Musculoskeletal: Mild degenerative changes of the lumbar spine. IMPRESSION:  Cholelithiasis, without associated inflammatory changes. Mild intrahepatic and extrahepatic ductal dilatation. Common duct measures 13 mm centrally and mildly tapers at the ampulla. No choledocholithiasis is seen. However, in the setting of abnormal LFTs, a nonvisualized distal CBD stone versus stricture is likely. Consider ERCP as clinically warranted. Segmental ductal dilatation with side branch ectasia in the distal pancreatic tail, chronic dating back to 2018. No associated obstructing pancreatic mass is evident on MR. Follow-up MRI abdomen with/without contrast could be performed in 1 year if clinically warranted given the patient's age. Mild hepatic steatosis. Trace bilateral pleural effusions. Electronically Signed   By: Charline Bills M.D.   On: 06/10/2023 00:32   MR 3D Recon At Scanner  Result Date: 06/10/2023 CLINICAL DATA:  Abdominal pain, nausea, diarrhea EXAM: MRI ABDOMEN WITHOUT AND WITH CONTRAST (INCLUDING MRCP) TECHNIQUE: Multiplanar multisequence MR imaging of the abdomen was performed both before and after the administration of intravenous contrast. Heavily T2-weighted images of the biliary and pancreatic ducts were obtained, and three-dimensional MRCP images were rendered by post processing. CONTRAST:  7mL GADAVIST GADOBUTROL 1 MMOL/ML IV SOLN COMPARISON:  CTA abdomen/pelvis dated 06/08/2023. CT abdomen/pelvis dated 05/13/2017. FINDINGS: Lower chest: Trace bilateral pleural effusions. Hepatobiliary: Mild hepatic steatosis. No suspicious/enhancing hepatic lesions. Gallbladder is notable for layering small gallstones (series 3/image 26), without associated inflammatory changes. Mild central intrahepatic ductal prominence. Common duct measures 13 mm centrally and mildly tapers at the ampulla. No choledocholithiasis is seen. Pancreas: Segmental ductal dilatation with side branch ectasia in the distal pancreatic tail (series 3/image 17). In retrospect, this appearance chronic dating back to  2018. No associated obstructing pancreatic mass is evident on MR. Spleen:  Within normal limits. Adrenals/Urinary Tract:  Adrenal glands are within normal limits. Two left renal cysts measuring up to 5 mm, benign. Right kidney is within normal limits. No hydronephrosis. Stomach/Bowel: Stomach is within normal limits. Visualized bowel is notable for scattered colonic diverticulosis, without evidence of diverticulitis. Vascular/Lymphatic:  No evidence of abdominal aortic aneurysm. No suspicious abdominal lymphadenopathy. Other:  No abdominal ascites. Musculoskeletal: Mild degenerative changes of the lumbar spine. IMPRESSION: Cholelithiasis, without associated inflammatory changes. Mild intrahepatic and extrahepatic ductal dilatation. Common duct measures 13 mm centrally and mildly tapers at the ampulla. No choledocholithiasis is seen. However, in the setting of abnormal LFTs, a nonvisualized distal CBD stone versus stricture is likely. Consider ERCP as clinically warranted. Segmental ductal dilatation with side branch ectasia in the distal pancreatic tail, chronic dating back to 2018. No associated obstructing pancreatic mass is evident on MR. Follow-up MRI abdomen with/without contrast could be performed in 1 year if clinically warranted given the patient's age. Mild hepatic steatosis. Trace bilateral pleural effusions. Electronically Signed   By: Charline Bills M.D.   On: 06/10/2023 00:32   ECHOCARDIOGRAM COMPLETE  Result Date: 06/09/2023    ECHOCARDIOGRAM REPORT   Patient Name:   Catherine Harding [facility] Date of Exam: 06/09/2023 Medical Rec #:  [id number]         Height:       61.0 in Accession #:    [id number]        Weight:       150.0 lb Date of Birth:  1929-12-14        BSA:          1.671 m Patient Age:    89 years          BP:           144/62 mmHg Patient Gender: F                 HR:           77 bpm. Exam Location:  Inpatient Procedure: 2D Echo, Color Doppler and Cardiac Doppler Indications:    Pre-op   History:        Patient has prior history of Echocardiogram examinations, most                 recent 02/10/2016. Risk Factors:Hypertension.  Sonographer:    Delcie Roch RDCS Referring Phys: [id number] SARA-MAIZ A THOMAS IMPRESSIONS  1. Left ventricular ejection fraction, by estimation, is 60 to 65%. The left ventricle has normal function. The left ventricle has no regional wall motion abnormalities. There is mild left ventricular hypertrophy. Left ventricular diastolic parameters are indeterminate.  2. Right ventricular systolic function is normal. The right ventricular size is normal. There is normal pulmonary artery systolic pressure.  3. Left atrial size was mildly dilated.  4. The mitral valve is degenerative. No evidence of mitral valve regurgitation. No evidence of mitral stenosis.  5. The aortic valve is tricuspid. Aortic valve regurgitation is not visualized. Aortic valve sclerosis is present, with no evidence of aortic valve stenosis.  6. The inferior vena cava is dilated in size with >50% respiratory variability, suggesting right atrial pressure of 8 mmHg.  7. Rhythm strip during this exam demonstrates normal sinus rhythm. Comparison(s): Prior outside echo 03/19/2022 reported: LVEF 59%, Grade 1 diastolic dysfunction, mild to moderate TR, trace AS/MS. FINDINGS  Left Ventricle: Left ventricular ejection fraction, by estimation, is 60 to 65%. The left ventricle has normal function. The left ventricle has no regional wall motion abnormalities. The left ventricular internal cavity size was normal in size. There is  mild left ventricular hypertrophy. Left ventricular diastolic parameters are indeterminate. Right Ventricle: The right ventricular size is normal. No increase in right ventricular wall thickness. Right ventricular systolic function is normal. There is normal pulmonary  artery systolic pressure. The tricuspid regurgitant velocity is 2.58 m/s, and  with an assumed right atrial pressure of 8 mmHg, the  estimated right ventricular systolic pressure is 34.6 mmHg. Left Atrium: Left atrial size was mildly dilated. Right Atrium: Right atrial size was normal in size. Pericardium: There is no evidence of pericardial effusion. Mitral Valve: The mitral valve is degenerative in appearance. Mild mitral annular calcification. No evidence of mitral valve regurgitation. No evidence of mitral valve stenosis. Tricuspid Valve: The tricuspid valve is normal in structure. Tricuspid valve regurgitation is mild . No evidence of tricuspid stenosis. Aortic Valve: The aortic valve is tricuspid. Aortic valve regurgitation is not visualized. Aortic valve sclerosis is present, with no evidence of aortic valve stenosis. Aortic valve mean gradient measures 10.0 mmHg. Aortic valve peak gradient measures 17.5 mmHg. Aortic valve area, by VTI measures 1.99 cm. Pulmonic Valve: The pulmonic valve was normal in structure. Pulmonic valve regurgitation is trivial. No evidence of pulmonic stenosis. Aorta: The aortic root and ascending aorta are structurally normal, with no evidence of dilitation. Venous: The inferior vena cava is dilated in size with greater than 50% respiratory variability, suggesting right atrial pressure of 8 mmHg. IAS/Shunts: No atrial level shunt detected by color flow Doppler. EKG: Rhythm strip during this exam demonstrates normal sinus rhythm.  LEFT VENTRICLE PLAX 2D LVIDd:         4.10 cm   Diastology LVIDs:         2.40 cm   LV e' medial:    7.83 cm/s LV PW:         1.10 cm   LV E/e' medial:  14.3 LV IVS:        1.20 cm   LV e' lateral:   9.46 cm/s LVOT diam:     2.00 cm   LV E/e' lateral: 11.8 LV SV:         84 LV SV Index:   50 LVOT Area:     3.14 cm  RIGHT VENTRICLE             IVC RV Basal diam:  2.40 cm     IVC diam: 2.10 cm RV S prime:     18.30 cm/s TAPSE (M-mode): 2.5 cm LEFT ATRIUM             Index        RIGHT ATRIUM           Index LA diam:        3.70 cm 2.21 cm/m   RA Area:     11.80 cm LA Vol (A2C):   62.8  ml 37.57 ml/m  RA Volume:   24.90 ml  14.90 ml/m LA Vol (A4C):   58.2 ml 34.82 ml/m LA Biplane Vol: 65.0 ml 38.89 ml/m  AORTIC VALVE AV Area (Vmax):    1.89 cm AV Area (Vmean):   1.83 cm AV Area (VTI):     1.99 cm AV Vmax:           209.00 cm/s AV Vmean:          144.000 cm/s AV VTI:            0.419 m AV Peak Grad:      17.5 mmHg AV Mean Grad:      10.0 mmHg LVOT Vmax:         125.50 cm/s LVOT Vmean:        83.900 cm/s LVOT VTI:  0.266 m LVOT/AV VTI ratio: 0.63  AORTA Ao Root diam: 3.00 cm Ao Asc diam:  3.30 cm MITRAL VALVE                TRICUSPID VALVE MV Area (PHT): 3.03 cm     TR Peak grad:   26.6 mmHg MV Decel Time: 250 msec     TR Vmax:        258.00 cm/s MV E velocity: 112.00 cm/s MV A velocity: 137.00 cm/s  SHUNTS MV E/A ratio:  0.82         Systemic VTI:  0.27 m                             Systemic Diam: 2.00 cm Sunit Tolia DO Electronically signed by Tessa Lerner DO Signature Date/Time: 06/09/2023/6:03:04 PM    Final    DG CHEST PORT 1 VIEW  Result Date: 06/09/2023 CLINICAL DATA:  Hypoxemia. EXAM: PORTABLE CHEST 1 VIEW COMPARISON:  02/14/2008 FINDINGS: Low volume film. Cardiopericardial silhouette is at upper limits of normal for size. Interstitial markings are diffusely coarsened with chronic features. Bones are diffusely demineralized. Telemetry leads overlie the chest. IMPRESSION: Low volume film without acute cardiopulmonary findings. Electronically Signed   By: Kennith Center M.D.   On: 06/09/2023 13:36   US Abdomen Limited RUQ (LIVER/GB)  Result Date: 06/08/2023 CLINICAL DATA:  Epigastric pain, nausea vomiting. EXAM: ULTRASOUND ABDOMEN LIMITED RIGHT UPPER QUADRANT COMPARISON:  CT dated 06/08/2023. FINDINGS: Gallbladder: There multiple stones within the gallbladder. The gallbladder wall is slightly thickened measuring 5 mm. There is a small pericholecystic fluid. Negative sonographic Murphy's sign. Common bile duct: Diameter: 13 mm. Liver: There is diffuse increased liver  echogenicity most commonly seen in the setting of fatty infiltration. Superimposed inflammation or fibrosis is not excluded. Clinical correlation is recommended. Portal vein is patent on color Doppler imaging with normal direction of blood flow towards the liver. Other: None. IMPRESSION: 1. Cholelithiasis with equivocal findings for acute cholecystitis. A hepatobiliary scintigraphy may provide better evaluation of the gallbladder if there is a high clinical concern for acute cholecystitis . 2. Fatty liver. Electronically Signed   By: Elgie Collard M.D.   On: 06/08/2023 22:23   CT Angio Chest/Abd/Pel for Dissection W and/or Wo Contrast  Result Date: 06/08/2023 CLINICAL DATA:  Chest and abdominal pain with hypertension, initial encounter EXAM: CT ANGIOGRAPHY CHEST, ABDOMEN AND PELVIS TECHNIQUE: Non-contrast CT of the chest was initially obtained. Multidetector CT imaging through the chest, abdomen and pelvis was performed using the standard protocol during bolus administration of intravenous contrast. Multiplanar reconstructed images and MIPs were obtained and reviewed to evaluate the vascular anatomy. RADIATION DOSE REDUCTION: This exam was performed according to the departmental dose-optimization program which includes automated exposure control, adjustment of the mA and/or kV according to patient size and/or use of iterative reconstruction technique. CONTRAST:  OMNIPAQUE IOHEXOL 350 MG/ML SOLN COMPARISON:  03/23/2023 FINDINGS: CTA CHEST FINDINGS Cardiovascular: Initial precontrast images show atherosclerotic calcification of the thoracic aorta. No hyperdense crescent to suggest acute aortic injury is seen. No aneurysmal dilatation of the aorta is noted on postcontrast images. No dissection is seen. Mild soft atherosclerotic plaque is noted in the distal descending thoracic aorta. The pulmonary artery shows a normal branching pattern bilaterally. No intraluminal filling defect to suggest pulmonary  embolism is seen. Coronary calcifications are noted. Mediastinum/Nodes: Thoracic inlet is within normal limits. No hilar or mediastinal adenopathy is noted. The  esophagus as visualized is within normal limits. Lungs/Pleura: Lungs are well aerated bilaterally. No focal infiltrate or sizable effusion is seen. Musculoskeletal: Degenerative changes of the thoracic spine are noted. No acute rib abnormality is noted. Old rib fractures are seen bilaterally with healing. Review of the MIP images confirms the above findings. CTA ABDOMEN AND PELVIS FINDINGS VASCULAR Aorta: Atherosclerotic calcifications are noted. No aneurysmal dilatation is seen. Celiac: Patent without evidence of aneurysm, dissection, vasculitis or significant stenosis. SMA: Patent without evidence of aneurysm, dissection, vasculitis or significant stenosis. Renals: Both renal arteries are patent without evidence of aneurysm, dissection, vasculitis, fibromuscular dysplasia or significant stenosis. IMA: Patent without evidence of aneurysm, dissection, vasculitis or significant stenosis. Inflow: Iliacs demonstrate atherosclerotic calcification without stenosis or aneurysmal dilatation. Veins: No specific venous abnormality is noted. Review of the MIP images confirms the above findings. NON-VASCULAR Hepatobiliary: Liver is within normal limits. Gallbladder is significantly distended with dependent density consistent with cholelithiasis. No biliary ductal dilatation is seen. Pancreas: Unremarkable. No pancreatic ductal dilatation or surrounding inflammatory changes. Spleen: Normal in size without focal abnormality. Adrenals/Urinary Tract: Adrenal glands are within normal limits. Kidneys demonstrate a normal enhancement pattern bilaterally. No renal calculi or obstructive changes are seen. The bladder is decompressed. Stomach/Bowel: Scattered diverticular change of the colon is noted without evidence of diverticulitis. The appendix is not well visualized. No  inflammatory changes to suggest appendicitis are noted. Small bowel and stomach are within normal limits. Lymphatic: No lymphadenopathy is seen. Reproductive: Status post hysterectomy. No adnexal masses. Other: No abdominal wall hernia or abnormality. No abdominopelvic ascites. Musculoskeletal: Degenerative changes of lumbar spine are noted. No acute abnormality noted. Review of the MIP images confirms the above findings. IMPRESSION: CTA of the chest: No evidence of aortic dissection. No evidence of pulmonary emboli is noted. CTA of the abdomen and pelvis: No arterial abnormality is noted. Diverticulosis without diverticulitis. Well distended gallbladder with evidence of cholelithiasis. Electronically Signed   By: Alcide Clever M.D.   On: 06/08/2023 21:55    IMPRESSION: Dilated common bile duct Transaminase elevation in mixed pattern Increasing total bilirubin  RUQ abdominal pain  Cholelithiasis History irritable bowel syndrome diarrhea predominant Gastroesophageal reflux disease  PLAN: -To further investigate dilated common bile duct and rising total bilirubin count, would consider ERCP with possible sphincterotomy, possible stent placement -Discussed ERCP procedure with patient and her family members at bedside, discussed benefits, alternatives and risks of procedure including bleeding/infection/perforation/missed lesion/anesthesia/pancreatitis, they were agreeable -She has been NPO today  -Patient status with Gastroenterology is currently unassigned at [facility] and therefore should be assigned to [facility] -I have personally communicated with the [facility] team about this patient consultation and current status, discussion will be had with [facility] attending staff and decision for ERCP will be made by their team  -[facility] will assume care of this patient    LOS: 1 day   Liliane Shi, DO [facility]

## 2023-06-10 NOTE — Consult Note (Signed)
[facility] service was called to evaluate this patient for consideration of ERCP.  Please see full note by Dr. Mauri Reading from [facility] earlier today for extensive background and details.  89 year old female with a history of IBS-D (follows with [facility]), HTN, GERD, osteoporosis, admitted with RUQ pain and nausea.  Admission evaluation notable for the following:  - AST/ALT 384/317 --> 187/2 is 32 - T. bili 3.7 --> 4.6 - ALP 100 - Normal renal function - WBC 6.2, H/H 11/32 - CTA: Distended gallbladder with cholelithiasis - Ultrasound: Hepatic steatosis, cholelithiasis with equivocal findings for acute cholecystitis. - MRCP: Mild intrahepatic/extrahepatic duct dilation with CBD 13 mm centrally with mild tapering at the ampulla.  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  She was evaluated by the General Surgery service on this admission as well.   On my evaluation, abdominal pain improving.  Asking for diet.  Liver enzymes downtrending today.  1) Elevated liver enzymes-improving 2) Elevated bilirubin 3) RUQ pain Discussed lab and imaging findings with patient and her son at bedside at length today.  T. bili uptrend 3.7 --> 4.6, but remainder of liver enzymes downtrending and ALP normal. Abdominal pain improving.    Discussed role/utility of ERCP at length. After discussion of the risk/benefits of ERCP, she would like to proceed with conservative measures. - Continue trending liver enzymes - Full liquids okay with n.p.o. at midnight in case she needs ERCP or cholecystectomy - GI service will reevaluate in the morning, but can hopefully manage with conservative measures in this 89 year old -Continue Zosyn  Doristine Locks, DO, [facility]

## 2023-06-10 NOTE — Progress Notes (Signed)
[facility] Progress Note     Subjective: CC-  Continues to have some RUQ abdominal pain. MRCP last night showed a common bile duct of 13mm centrally with mild tapering at the ampulla, no definite choledocholithiasis seen but concern for nonvisualized distal CBD stone versus stricture. LFTs continue to rise today.  Objective: Vital signs in last 24 hours: Temp:  [98 F (36.7 C)-98.4 F (36.9 C)] 98.3 F (36.8 C) (08/30 0727) Pulse Rate:  [74-75] 75 (08/30 0727) Resp:  [20] 20 (08/30 0727) BP: (144-163)/(62-82) 149/71 (08/30 0727) SpO2:  [91 %-94 %] 91 % (08/30 0727) Weight:  [70.2 kg-72.3 kg] 72.3 kg (08/30 0300)    Intake/Output from previous day: 08/29 0701 - 08/30 0700 In: 1514.2 [I.V.:1423.7; IV Piggyback:90.6] Out: -  Intake/Output this shift: Total I/O In: -  Out: 400 [Urine:400]  PE: Gen:  Alert, NAD, pleasant Abd: soft, ND, TTP RUQ and epigastric without rebound or guarding  Lab Results:  Recent Labs    06/09/23 1226 06/10/23 0734  WBC 10.1 6.2  HGB 11.2* 10.8*  HCT 33.0* 32.3*  PLT 180 152   BMET Recent Labs    06/09/23 1226 06/10/23 0734  NA 137 136  K 3.3* 3.0*  CL 106 107  CO2 22 20*  GLUCOSE 120* 131*  BUN 8 7*  CREATININE 0.73 0.77  CALCIUM 8.5* 8.3*   PT/INR No results for input(s): "LABPROT", "INR" in the last 72 hours. CMP     Component Value Date/Time   NA 136 06/10/2023 0734   NA 139 03/16/2023 1340   K 3.0 (L) 06/10/2023 0734   CL 107 06/10/2023 0734   CO2 20 (L) 06/10/2023 0734   GLUCOSE 131 (H) 06/10/2023 0734   BUN 7 (L) 06/10/2023 0734   BUN 16 03/16/2023 1340   CREATININE 0.77 06/10/2023 0734   CREATININE 0.76 08/28/2012 1249   CALCIUM 8.3 (L) 06/10/2023 0734   CALCIUM 10.1 08/28/2012 1249   PROT 5.4 (L) 06/10/2023 0734   PROT 6.6 03/16/2023 1340   ALBUMIN 2.9 (L) 06/10/2023 0734   ALBUMIN 4.1 03/16/2023 1340   AST 187 (H) 06/10/2023 0734   ALT 232 (H) 06/10/2023 0734   ALKPHOS 100 06/10/2023 0734    BILITOT 4.6 (H) 06/10/2023 0734   BILITOT 0.6 03/16/2023 1340   GFRNONAA >60 06/10/2023 0734   GFRAA >90 07/27/2012 1100   Lipase     Component Value Date/Time   LIPASE 19 06/08/2023 1922       Studies/Results: MR ABDOMEN MRCP W WO CONTAST  Result Date: 06/10/2023 CLINICAL DATA:  Abdominal pain, nausea, diarrhea EXAM: MRI ABDOMEN WITHOUT AND WITH CONTRAST (INCLUDING MRCP) TECHNIQUE: Multiplanar multisequence MR imaging of the abdomen was performed both before and after the administration of intravenous contrast. Heavily T2-weighted images of the biliary and pancreatic ducts were obtained, and three-dimensional MRCP images were rendered by post processing. CONTRAST:  7mL GADAVIST GADOBUTROL 1 MMOL/ML IV SOLN COMPARISON:  CTA abdomen/pelvis dated 06/08/2023. CT abdomen/pelvis dated 05/13/2017. FINDINGS: Lower chest: Trace bilateral pleural effusions. Hepatobiliary: Mild hepatic steatosis. No suspicious/enhancing hepatic lesions. Gallbladder is notable for layering small gallstones (series 3/image 26), without associated inflammatory changes. Mild central intrahepatic ductal prominence. Common duct measures 13 mm centrally and mildly tapers at the ampulla. No choledocholithiasis is seen. Pancreas: Segmental ductal dilatation with side branch ectasia in the distal pancreatic tail (series 3/image 17). In retrospect, this appearance chronic dating back to 2018. No associated obstructing pancreatic mass is evident on MR. Spleen:  Within  normal limits. Adrenals/Urinary Tract:  Adrenal glands are within normal limits. Two left renal cysts measuring up to 5 mm, benign. Right kidney is within normal limits. No hydronephrosis. Stomach/Bowel: Stomach is within normal limits. Visualized bowel is notable for scattered colonic diverticulosis, without evidence of diverticulitis. Vascular/Lymphatic:  No evidence of abdominal aortic aneurysm. No suspicious abdominal lymphadenopathy. Other:  No abdominal ascites.  Musculoskeletal: Mild degenerative changes of the lumbar spine. IMPRESSION: Cholelithiasis, without associated inflammatory changes. Mild intrahepatic and extrahepatic ductal dilatation. Common duct measures 13 mm centrally and mildly tapers at the ampulla. No choledocholithiasis is seen. However, in the setting of abnormal LFTs, a nonvisualized distal CBD stone versus stricture is likely. Consider ERCP as clinically warranted. Segmental ductal dilatation with side branch ectasia in the distal pancreatic tail, chronic dating back to 2018. No associated obstructing pancreatic mass is evident on MR. Follow-up MRI abdomen with/without contrast could be performed in 1 year if clinically warranted given the patient's age. Mild hepatic steatosis. Trace bilateral pleural effusions. Electronically Signed   By: Charline Bills M.D.   On: 06/10/2023 00:32   MR 3D Recon At Scanner  Result Date: 06/10/2023 CLINICAL DATA:  Abdominal pain, nausea, diarrhea EXAM: MRI ABDOMEN WITHOUT AND WITH CONTRAST (INCLUDING MRCP) TECHNIQUE: Multiplanar multisequence MR imaging of the abdomen was performed both before and after the administration of intravenous contrast. Heavily T2-weighted images of the biliary and pancreatic ducts were obtained, and three-dimensional MRCP images were rendered by post processing. CONTRAST:  7mL GADAVIST GADOBUTROL 1 MMOL/ML IV SOLN COMPARISON:  CTA abdomen/pelvis dated 06/08/2023. CT abdomen/pelvis dated 05/13/2017. FINDINGS: Lower chest: Trace bilateral pleural effusions. Hepatobiliary: Mild hepatic steatosis. No suspicious/enhancing hepatic lesions. Gallbladder is notable for layering small gallstones (series 3/image 26), without associated inflammatory changes. Mild central intrahepatic ductal prominence. Common duct measures 13 mm centrally and mildly tapers at the ampulla. No choledocholithiasis is seen. Pancreas: Segmental ductal dilatation with side branch ectasia in the distal pancreatic tail  (series 3/image 17). In retrospect, this appearance chronic dating back to 2018. No associated obstructing pancreatic mass is evident on MR. Spleen:  Within normal limits. Adrenals/Urinary Tract:  Adrenal glands are within normal limits. Two left renal cysts measuring up to 5 mm, benign. Right kidney is within normal limits. No hydronephrosis. Stomach/Bowel: Stomach is within normal limits. Visualized bowel is notable for scattered colonic diverticulosis, without evidence of diverticulitis. Vascular/Lymphatic:  No evidence of abdominal aortic aneurysm. No suspicious abdominal lymphadenopathy. Other:  No abdominal ascites. Musculoskeletal: Mild degenerative changes of the lumbar spine. IMPRESSION: Cholelithiasis, without associated inflammatory changes. Mild intrahepatic and extrahepatic ductal dilatation. Common duct measures 13 mm centrally and mildly tapers at the ampulla. No choledocholithiasis is seen. However, in the setting of abnormal LFTs, a nonvisualized distal CBD stone versus stricture is likely. Consider ERCP as clinically warranted. Segmental ductal dilatation with side branch ectasia in the distal pancreatic tail, chronic dating back to 2018. No associated obstructing pancreatic mass is evident on MR. Follow-up MRI abdomen with/without contrast could be performed in 1 year if clinically warranted given the patient's age. Mild hepatic steatosis. Trace bilateral pleural effusions. Electronically Signed   By: Charline Bills M.D.   On: 06/10/2023 00:32   ECHOCARDIOGRAM COMPLETE  Result Date: 06/09/2023    ECHOCARDIOGRAM REPORT   Patient Name:   Catherine Harding [facility] Date of Exam: 06/09/2023 Medical Rec #:  [id number]         Height:       61.0 in Accession #:    [id number]  Weight:       150.0 lb Date of Birth:  Mar 10, 1930        BSA:          1.671 m Patient Age:    89 years          BP:           144/62 mmHg Patient Gender: F                 HR:           77 bpm. Exam Location:  Inpatient  Procedure: 2D Echo, Color Doppler and Cardiac Doppler Indications:    Pre-op  History:        Patient has prior history of Echocardiogram examinations, most                 recent 02/10/2016. Risk Factors:Hypertension.  Sonographer:    Delcie Roch RDCS Referring Phys: [id number] SARA-MAIZ A THOMAS IMPRESSIONS  1. Left ventricular ejection fraction, by estimation, is 60 to 65%. The left ventricle has normal function. The left ventricle has no regional wall motion abnormalities. There is mild left ventricular hypertrophy. Left ventricular diastolic parameters are indeterminate.  2. Right ventricular systolic function is normal. The right ventricular size is normal. There is normal pulmonary artery systolic pressure.  3. Left atrial size was mildly dilated.  4. The mitral valve is degenerative. No evidence of mitral valve regurgitation. No evidence of mitral stenosis.  5. The aortic valve is tricuspid. Aortic valve regurgitation is not visualized. Aortic valve sclerosis is present, with no evidence of aortic valve stenosis.  6. The inferior vena cava is dilated in size with >50% respiratory variability, suggesting right atrial pressure of 8 mmHg.  7. Rhythm strip during this exam demonstrates normal sinus rhythm. Comparison(s): Prior outside echo 03/19/2022 reported: LVEF 59%, Grade 1 diastolic dysfunction, mild to moderate TR, trace AS/MS. FINDINGS  Left Ventricle: Left ventricular ejection fraction, by estimation, is 60 to 65%. The left ventricle has normal function. The left ventricle has no regional wall motion abnormalities. The left ventricular internal cavity size was normal in size. There is  mild left ventricular hypertrophy. Left ventricular diastolic parameters are indeterminate. Right Ventricle: The right ventricular size is normal. No increase in right ventricular wall thickness. Right ventricular systolic function is normal. There is normal pulmonary artery systolic pressure. The tricuspid regurgitant  velocity is 2.58 m/s, and  with an assumed right atrial pressure of 8 mmHg, the estimated right ventricular systolic pressure is 34.6 mmHg. Left Atrium: Left atrial size was mildly dilated. Right Atrium: Right atrial size was normal in size. Pericardium: There is no evidence of pericardial effusion. Mitral Valve: The mitral valve is degenerative in appearance. Mild mitral annular calcification. No evidence of mitral valve regurgitation. No evidence of mitral valve stenosis. Tricuspid Valve: The tricuspid valve is normal in structure. Tricuspid valve regurgitation is mild . No evidence of tricuspid stenosis. Aortic Valve: The aortic valve is tricuspid. Aortic valve regurgitation is not visualized. Aortic valve sclerosis is present, with no evidence of aortic valve stenosis. Aortic valve mean gradient measures 10.0 mmHg. Aortic valve peak gradient measures 17.5 mmHg. Aortic valve area, by VTI measures 1.99 cm. Pulmonic Valve: The pulmonic valve was normal in structure. Pulmonic valve regurgitation is trivial. No evidence of pulmonic stenosis. Aorta: The aortic root and ascending aorta are structurally normal, with no evidence of dilitation. Venous: The inferior vena cava is dilated in size with greater than 50% respiratory variability, suggesting  right atrial pressure of 8 mmHg. IAS/Shunts: No atrial level shunt detected by color flow Doppler. EKG: Rhythm strip during this exam demonstrates normal sinus rhythm.  LEFT VENTRICLE PLAX 2D LVIDd:         4.10 cm   Diastology LVIDs:         2.40 cm   LV e' medial:    7.83 cm/s LV PW:         1.10 cm   LV E/e' medial:  14.3 LV IVS:        1.20 cm   LV e' lateral:   9.46 cm/s LVOT diam:     2.00 cm   LV E/e' lateral: 11.8 LV SV:         84 LV SV Index:   50 LVOT Area:     3.14 cm  RIGHT VENTRICLE             IVC RV Basal diam:  2.40 cm     IVC diam: 2.10 cm RV S prime:     18.30 cm/s TAPSE (M-mode): 2.5 cm LEFT ATRIUM             Index        RIGHT ATRIUM           Index LA  diam:        3.70 cm 2.21 cm/m   RA Area:     11.80 cm LA Vol (A2C):   62.8 ml 37.57 ml/m  RA Volume:   24.90 ml  14.90 ml/m LA Vol (A4C):   58.2 ml 34.82 ml/m LA Biplane Vol: 65.0 ml 38.89 ml/m  AORTIC VALVE AV Area (Vmax):    1.89 cm AV Area (Vmean):   1.83 cm AV Area (VTI):     1.99 cm AV Vmax:           209.00 cm/s AV Vmean:          144.000 cm/s AV VTI:            0.419 m AV Peak Grad:      17.5 mmHg AV Mean Grad:      10.0 mmHg LVOT Vmax:         125.50 cm/s LVOT Vmean:        83.900 cm/s LVOT VTI:          0.266 m LVOT/AV VTI ratio: 0.63  AORTA Ao Root diam: 3.00 cm Ao Asc diam:  3.30 cm MITRAL VALVE                TRICUSPID VALVE MV Area (PHT): 3.03 cm     TR Peak grad:   26.6 mmHg MV Decel Time: 250 msec     TR Vmax:        258.00 cm/s MV E velocity: 112.00 cm/s MV A velocity: 137.00 cm/s  SHUNTS MV E/A ratio:  0.82         Systemic VTI:  0.27 m                             Systemic Diam: 2.00 cm Sunit Tolia DO Electronically signed by Tessa Lerner DO Signature Date/Time: 06/09/2023/6:03:04 PM    Final    DG CHEST PORT 1 VIEW  Result Date: 06/09/2023 CLINICAL DATA:  Hypoxemia. EXAM: PORTABLE CHEST 1 VIEW COMPARISON:  02/14/2008 FINDINGS: Low volume film. Cardiopericardial silhouette is at upper limits of normal for size. Interstitial markings are diffusely coarsened  with chronic features. Bones are diffusely demineralized. Telemetry leads overlie the chest. IMPRESSION: Low volume film without acute cardiopulmonary findings. Electronically Signed   By: Kennith Center M.D.   On: 06/09/2023 13:36   US Abdomen Limited RUQ (LIVER/GB)  Result Date: 06/08/2023 CLINICAL DATA:  Epigastric pain, nausea vomiting. EXAM: ULTRASOUND ABDOMEN LIMITED RIGHT UPPER QUADRANT COMPARISON:  CT dated 06/08/2023. FINDINGS: Gallbladder: There multiple stones within the gallbladder. The gallbladder wall is slightly thickened measuring 5 mm. There is a small pericholecystic fluid. Negative sonographic Murphy's sign.  Common bile duct: Diameter: 13 mm. Liver: There is diffuse increased liver echogenicity most commonly seen in the setting of fatty infiltration. Superimposed inflammation or fibrosis is not excluded. Clinical correlation is recommended. Portal vein is patent on color Doppler imaging with normal direction of blood flow towards the liver. Other: None. IMPRESSION: 1. Cholelithiasis with equivocal findings for acute cholecystitis. A hepatobiliary scintigraphy may provide better evaluation of the gallbladder if there is a high clinical concern for acute cholecystitis . 2. Fatty liver. Electronically Signed   By: Elgie Collard M.D.   On: 06/08/2023 22:23   CT Angio Chest/Abd/Pel for Dissection W and/or Wo Contrast  Result Date: 06/08/2023 CLINICAL DATA:  Chest and abdominal pain with hypertension, initial encounter EXAM: CT ANGIOGRAPHY CHEST, ABDOMEN AND PELVIS TECHNIQUE: Non-contrast CT of the chest was initially obtained. Multidetector CT imaging through the chest, abdomen and pelvis was performed using the standard protocol during bolus administration of intravenous contrast. Multiplanar reconstructed images and MIPs were obtained and reviewed to evaluate the vascular anatomy. RADIATION DOSE REDUCTION: This exam was performed according to the departmental dose-optimization program which includes automated exposure control, adjustment of the mA and/or kV according to patient size and/or use of iterative reconstruction technique. CONTRAST:  OMNIPAQUE IOHEXOL 350 MG/ML SOLN COMPARISON:  03/23/2023 FINDINGS: CTA CHEST FINDINGS Cardiovascular: Initial precontrast images show atherosclerotic calcification of the thoracic aorta. No hyperdense crescent to suggest acute aortic injury is seen. No aneurysmal dilatation of the aorta is noted on postcontrast images. No dissection is seen. Mild soft atherosclerotic plaque is noted in the distal descending thoracic aorta. The pulmonary artery shows a normal branching  pattern bilaterally. No intraluminal filling defect to suggest pulmonary embolism is seen. Coronary calcifications are noted. Mediastinum/Nodes: Thoracic inlet is within normal limits. No hilar or mediastinal adenopathy is noted. The esophagus as visualized is within normal limits. Lungs/Pleura: Lungs are well aerated bilaterally. No focal infiltrate or sizable effusion is seen. Musculoskeletal: Degenerative changes of the thoracic spine are noted. No acute rib abnormality is noted. Old rib fractures are seen bilaterally with healing. Review of the MIP images confirms the above findings. CTA ABDOMEN AND PELVIS FINDINGS VASCULAR Aorta: Atherosclerotic calcifications are noted. No aneurysmal dilatation is seen. Celiac: Patent without evidence of aneurysm, dissection, vasculitis or significant stenosis. SMA: Patent without evidence of aneurysm, dissection, vasculitis or significant stenosis. Renals: Both renal arteries are patent without evidence of aneurysm, dissection, vasculitis, fibromuscular dysplasia or significant stenosis. IMA: Patent without evidence of aneurysm, dissection, vasculitis or significant stenosis. Inflow: Iliacs demonstrate atherosclerotic calcification without stenosis or aneurysmal dilatation. Veins: No specific venous abnormality is noted. Review of the MIP images confirms the above findings. NON-VASCULAR Hepatobiliary: Liver is within normal limits. Gallbladder is significantly distended with dependent density consistent with cholelithiasis. No biliary ductal dilatation is seen. Pancreas: Unremarkable. No pancreatic ductal dilatation or surrounding inflammatory changes. Spleen: Normal in size without focal abnormality. Adrenals/Urinary Tract: Adrenal glands are within normal limits. Kidneys demonstrate a  normal enhancement pattern bilaterally. No renal calculi or obstructive changes are seen. The bladder is decompressed. Stomach/Bowel: Scattered diverticular change of the colon is noted  without evidence of diverticulitis. The appendix is not well visualized. No inflammatory changes to suggest appendicitis are noted. Small bowel and stomach are within normal limits. Lymphatic: No lymphadenopathy is seen. Reproductive: Status post hysterectomy. No adnexal masses. Other: No abdominal wall hernia or abnormality. No abdominopelvic ascites. Musculoskeletal: Degenerative changes of lumbar spine are noted. No acute abnormality noted. Review of the MIP images confirms the above findings. IMPRESSION: CTA of the chest: No evidence of aortic dissection. No evidence of pulmonary emboli is noted. CTA of the abdomen and pelvis: No arterial abnormality is noted. Diverticulosis without diverticulitis. Well distended gallbladder with evidence of cholelithiasis. Electronically Signed   By: Alcide Clever M.D.   On: 06/08/2023 21:55    Anti-infectives: Anti-infectives (From admission, onward)    Start     Dose/Rate Route Frequency Ordered Stop   06/09/23 1200  piperacillin-tazobactam (ZOSYN) IVPB 3.375 g  Status:  Discontinued        3.375 g 100 mL/hr over 30 Minutes Intravenous Every 6 hours 06/09/23 0846 06/09/23 0857   06/09/23 1000  piperacillin-tazobactam (ZOSYN) IVPB 3.375 g        3.375 g 12.5 mL/hr over 240 Minutes Intravenous Every 8 hours 06/09/23 0857     06/08/23 2300  piperacillin-tazobactam (ZOSYN) IVPB 3.375 g        3.375 g 100 mL/hr over 30 Minutes Intravenous  Once 06/08/23 2248 06/09/23 0009        Assessment/Plan Acute cholecystitis Elevated LFTs - MRCP showed a common bile duct of 13mm centrally with mild tapering at the ampulla, no definite choledocholithiasis seen but concern for nonvisualized distal CBD stone versus stricture; concern for cholecystitis on CT and u/s but no signs of -itis on MRCP - LFTs more elevated today. I have called [facility] for consult. - continue antibiotics  ID - zosyn VTE - SCDs, sq heparin FEN - IVF, NPO Foley - none   Muscular dystrophy -  nearly wheelchair bound since May 2024 Pseudocholinesterase deficiency HTN PACs IBS  I reviewed last 24 h vitals and pain scores, last 48 h intake and output, last 24 h labs and trends, and last 24 h imaging results.    LOS: 1 day    Franne Forts, [facility] 06/10/2023, 9:40 AM Please see Amion for pager number during day hours 7:00am-4:30pm

## 2023-06-10 NOTE — Progress Notes (Signed)
  Progress Note   [name] ZOX:[id number] DOB: [birth year] DOA: 06/08/2023     1 DOS: the patient was seen and examined on 06/10/2023        Brief hospital course: Catherine Harding is a 89 y.o. F with muscular dystrophy, pseudocholinesterase deficiency who presented with RUQ pain and vomiting.       Assessment and Plan: Transaminitis and right upper quadrant pain Patient has had imaging with ultrasound, CT of the abdomen, and MRCP. She has been evaluated by gastroenterology and general surgery. MRCP imaging findings, absence of white count militate against diagnosis of cholecystitis.  LFTs are trending down and her symptoms are improving, and GI are understandably reluctant to jump to ERCP in a 73s-year-old patient.   - Continue Zosyn - Trend LFTs - COnsult GI, Gen Surg   Hypertension BP slightly up - Continue diltiazem - Hold Lasix for now  Pseudocholinesterase deficiency My understanding is that this increases the risk of anesthesia, but I have no idea to what extent that sort of risk can be mitigated.        Subjective: Patient still has some pain but overall improving, no vomiting, no nursing concerns.     Physical Exam: BP (!) 151/62 (BP Location: Right Arm)   Pulse 70   Temp 97.8 F (36.6 C) (Oral)   Resp 18   Ht 5\' 1"  (1.549 m)   Wt 72.3 kg   SpO2 91%   BMI 30.12 kg/m   Elderly adult female, sitting in recliner, interactive and appropriate RRR, no murmurs, no peripheral edema Respiratory rate normal, lungs clear without rales or wheezes Light palpation of the abdomen elicits no significant guarding, no rigidity, no distention Attention normal, affect appropriate, judgment insight appear normal  Data Reviewed: Discussed with general surgery and GI LFTs, CBC, and BMP reviewed  Family Communication: Family at the bedside    Disposition: Status is: Inpatient         Author: Alberteen Sam, MD 06/10/2023 6:32  PM  For on call review [email or web address].

## 2023-06-11 DIAGNOSIS — K580 Irritable bowel syndrome with diarrhea: Secondary | ICD-10-CM

## 2023-06-11 DIAGNOSIS — R1011 Right upper quadrant pain: Secondary | ICD-10-CM | POA: Diagnosis not present

## 2023-06-11 DIAGNOSIS — R748 Abnormal levels of other serum enzymes: Secondary | ICD-10-CM

## 2023-06-11 DIAGNOSIS — K810 Acute cholecystitis: Secondary | ICD-10-CM | POA: Diagnosis not present

## 2023-06-11 LAB — COMPREHENSIVE METABOLIC PANEL
ALT: 187 U/L — ABNORMAL HIGH (ref 0–44)
AST: 123 U/L — ABNORMAL HIGH (ref 15–41)
Albumin: 2.7 g/dL — ABNORMAL LOW (ref 3.5–5.0)
Alkaline Phosphatase: 123 U/L (ref 38–126)
Anion gap: 9 (ref 5–15)
BUN: 7 mg/dL — ABNORMAL LOW (ref 8–23)
CO2: 19 mmol/L — ABNORMAL LOW (ref 22–32)
Calcium: 8.3 mg/dL — ABNORMAL LOW (ref 8.9–10.3)
Chloride: 105 mmol/L (ref 98–111)
Creatinine, Ser: 0.8 mg/dL (ref 0.44–1.00)
GFR, Estimated: >60 mL/min (ref >60)
Glucose, Bld: 131 mg/dL — ABNORMAL HIGH (ref 70–99)
Potassium: 3.5 mmol/L (ref 3.5–5.1)
Sodium: 133 mmol/L — ABNORMAL LOW (ref 135–145)
Total Bilirubin: 3.4 mg/dL — ABNORMAL HIGH (ref 0.3–1.2)
Total Protein: 5.4 g/dL — ABNORMAL LOW (ref 6.5–8.1)

## 2023-06-11 MED ORDER — LOPERAMIDE HCL 2 MG PO CAPS
2.0000 mg | ORAL_CAPSULE | Freq: Four times a day (QID) | ORAL | Status: DC | PRN
  Administered 2023-06-12 – 2023-06-13 (×3): 2 mg via ORAL
  Filled 2023-06-11 (×5): qty 1

## 2023-06-11 MED ORDER — HYOSCYAMINE SULFATE 0.125 MG SL SUBL
0.1250 mg | SUBLINGUAL_TABLET | Freq: Four times a day (QID) | SUBLINGUAL | Status: DC | PRN
  Administered 2023-06-13: 0.125 mg via SUBLINGUAL
  Filled 2023-06-11 (×3): qty 1

## 2023-06-11 NOTE — Plan of Care (Signed)

## 2023-06-11 NOTE — Evaluation (Signed)
Physical Therapy Evaluation Patient Details Name: Catherine Harding MRN: [id number] DOB: 09-08-1930 Today's Date: 06/11/2023  History of Present Illness  Patient is a 89 y/o female admitted 06/08/23 with R UQ pain and vomiting found to have cholecystitis on CT though no signs on MRCP.  PMH positive for muscular dystrophy, pseudocholinesterase deficiency, HTN, GERD, OA.  Clinical Impression  Patient presents with decreased mobility due to generalized weakness and prolonged bedrest.  Patient usually able to be independent at home in either wheelchair or rollator.  She can complete BADL's and most IADL's though has supportive family.  She was able to stand and take a few steps today though feels her legs are weak.  She may need inpatient rehab <3 hours/day unless she can set up her neighbor to stay with her initially.  PT will continue to follow.        If plan is discharge home, recommend the following: A little help with walking and/or transfers;Assistance with cooking/housework;Assist for transportation;A little help with bathing/dressing/bathroom;Help with stairs or ramp for entrance   Can travel by private vehicle   Yes    Equipment Recommendations None recommended by PT  Recommendations for Other Services       Functional Status Assessment Patient has had a recent decline in their functional status and demonstrates the ability to make significant improvements in function in a reasonable and predictable amount of time.     Precautions / Restrictions Precautions Precautions: Fall      Mobility  Bed Mobility Overal bed mobility: Needs Assistance Bed Mobility: Rolling, Sidelying to Sit Rolling: Contact guard assist Sidelying to sit: Used rails, HOB elevated, Contact guard assist       General bed mobility comments: cues to use rail, assist for lifting trunk    Transfers Overall transfer level: Needs assistance Equipment used: Rollator (4 wheels) Transfers: Sit to/from  Stand Sit to Stand: Min assist, Contact guard assist, Supervision           General transfer comment: performed x 5 progressing with less assistance; though pt pulls up on Rollator    Ambulation/Gait Ambulation/Gait assistance: Contact guard assist Gait Distance (Feet): 5 Feet Assistive device: Rollator (4 wheels) Gait Pattern/deviations: Shuffle, Wide base of support, Decreased dorsiflexion - right, Decreased dorsiflexion - left, Step-to pattern       General Gait Details: great difficulty moving R foot forward and pt relates has R LE pain so assisted to move back to EOB  Stairs            Wheelchair Mobility     Tilt Bed    Modified Rankin (Stroke Patients Only)       Balance Overall balance assessment: Needs assistance   Sitting balance-Leahy Scale: Good     Standing balance support: Bilateral upper extremity supported Standing balance-Leahy Scale: Poor Standing balance comment: UE support and CGA                             Pertinent Vitals/Pain Pain Assessment Pain Assessment: Faces Faces Pain Scale: Hurts little more Pain Location: baseline R lower quadrant pain (?muscular) Pain Descriptors / Indicators: Discomfort Pain Intervention(s): Monitored during session    Home Living Family/patient expects to be discharged to:: Private residence Living Arrangements: Alone Available Help at Discharge: Family;Available PRN/intermittently Type of Home: House Home Access: Level entry     Alternate Level Stairs-Number of Steps: does not go to basement Home Layout: Two level;Able to live  on main level with bedroom/bathroom Home Equipment: Grab bars - tub/shower;Tub bench;Rollator (4 wheels);Wheelchair - manual;Transport chair;BSC/3in1;Cane - single point;Other (comment) Additional Comments: life alert system    Prior Function Prior Level of Function : Independent/Modified Independent             Mobility Comments: can walk some with  rollator though limited, uses wheelchair also ADLs Comments: showers using tub bench, completes cooking, laundry, etc     Extremity/Trunk Assessment   Upper Extremity Assessment Upper Extremity Assessment: Defer to OT evaluation    Lower Extremity Assessment Lower Extremity Assessment: RLE deficits/detail;LLE deficits/detail RLE Deficits / Details: AAROM WFL, strength hip flexion 2+/5, knee extension 4-/5, ankle DF 3+/5 LLE Deficits / Details: AAROM WFL, strength hip flexion 2+/5, knee extension 4-/5, ankle DF 3+/5       Communication   Communication Communication: Hearing impairment  Cognition Arousal: Alert Behavior During Therapy: WFL for tasks assessed/performed Overall Cognitive Status: Within Functional Limits for tasks assessed                                          General Comments General comments (skin integrity, edema, etc.): VSS on RA though with some dyspnea with mobility; Daughter and son present and assist with information about home.    Exercises Other Exercises Other Exercises: sit<>stand x 5   Assessment/Plan    PT Assessment Patient needs continued PT services  PT Problem List Decreased strength;Decreased activity tolerance;Decreased mobility;Decreased balance       PT Treatment Interventions DME instruction;Functional mobility training;Balance training;Patient/family education;Therapeutic activities;Gait training;Therapeutic exercise    PT Goals (Current goals can be found in the Care Plan section)  Acute Rehab PT Goals Patient Stated Goal: to go home PT Goal Formulation: With patient/family Time For Goal Achievement: 06/25/23 Potential to Achieve Goals: Good    Frequency Min 1X/week     Co-evaluation               AM-PAC PT "6 Clicks" Mobility  Outcome Measure Help needed turning from your back to your side while in a flat bed without using bedrails?: A Little Help needed moving from lying on your back to sitting  on the side of a flat bed without using bedrails?: A Little Help needed moving to and from a bed to a chair (including a wheelchair)?: A Little Help needed standing up from a chair using your arms (e.g., wheelchair or bedside chair)?: A Little Help needed to walk in hospital room?: Total Help needed climbing 3-5 steps with a railing? : Total 6 Click Score: 14    End of Session Equipment Utilized During Treatment: Gait belt Activity Tolerance: Patient limited by fatigue Patient left: in bed;with call bell/phone within reach;with family/visitor present   PT Visit Diagnosis: Other abnormalities of gait and mobility (R26.89);Muscle weakness (generalized) (M62.81)    Time: 1610-9604 PT Time Calculation (min) (ACUTE ONLY): 38 min   Charges:   PT Evaluation $PT Eval Moderate Complexity: 1 Mod PT Treatments $Therapeutic Exercise: 8-22 mins $Therapeutic Activity: 8-22 mins PT General Charges $$ ACUTE PT VISIT: 1 Visit         Sheran Lawless, PT Acute Rehabilitation Services [phone] 06/11/2023   Catherine Harding 06/11/2023, 4:52 PM

## 2023-06-11 NOTE — Assessment & Plan Note (Signed)
Patient has had imaging with ultrasound, CT of the abdomen, and MRCP. She has been evaluated by gastroenterology and general surgery. MRCP imaging findings, absence of white count militate against diagnosis of cholecystitis.  LFTs are trending down and her symptoms are improving, and GI are understandably reluctant to jump to ERCP in a 55s-year-old patient.    - Continue Zosyn - Trend LFTs - COnsult GI, Gen Surg

## 2023-06-11 NOTE — Progress Notes (Signed)
[facility] Progress Note     Subjective: CC-  Continues to have abdominal pain, but feels that she is back to her baseline abdominal pain. Tolerated full liquids yesterday without worsening abdominal pain or n/v. LFTs trending down.  Objective: Vital signs in last 24 hours: Temp:  [97.8 F (36.6 C)-99 F (37.2 C)] 98 F (36.7 C) (08/31 0834) Pulse Rate:  [70-73] 71 (08/31 0834) Resp:  [17-24] 24 (08/31 0834) BP: (151-161)/(62-85) 161/77 (08/31 0834) SpO2:  [90 %-92 %] 92 % (08/31 0834) Last BM Date : 06/10/23  Intake/Output from previous day: 08/30 0701 - 08/31 0700 In: 120 [P.O.:120] Out: 1600 [Urine:1600] Intake/Output this shift: No intake/output data recorded.  PE: Gen:  Alert, NAD, pleasant Abd: soft, ND, generalized TTP without rebound or guarding  Lab Results:  Recent Labs    06/09/23 1226 06/10/23 0734  WBC 10.1 6.2  HGB 11.2* 10.8*  HCT 33.0* 32.3*  PLT 180 152   BMET Recent Labs    06/10/23 0734 06/11/23 0318  NA 136 133*  K 3.0* 3.5  CL 107 105  CO2 20* 19*  GLUCOSE 131* 131*  BUN 7* 7*  CREATININE 0.77 0.80  CALCIUM 8.3* 8.3*   PT/INR No results for input(s): "LABPROT", "INR" in the last 72 hours. CMP     Component Value Date/Time   NA 133 (L) 06/11/2023 0318   NA 139 03/16/2023 1340   K 3.5 06/11/2023 0318   CL 105 06/11/2023 0318   CO2 19 (L) 06/11/2023 0318   GLUCOSE 131 (H) 06/11/2023 0318   BUN 7 (L) 06/11/2023 0318   BUN 16 03/16/2023 1340   CREATININE 0.80 06/11/2023 0318   CREATININE 0.76 08/28/2012 1249   CALCIUM 8.3 (L) 06/11/2023 0318   CALCIUM 10.1 08/28/2012 1249   PROT 5.4 (L) 06/11/2023 0318   PROT 6.6 03/16/2023 1340   ALBUMIN 2.7 (L) 06/11/2023 0318   ALBUMIN 4.1 03/16/2023 1340   AST 123 (H) 06/11/2023 0318   ALT 187 (H) 06/11/2023 0318   ALKPHOS 123 06/11/2023 0318   BILITOT 3.4 (H) 06/11/2023 0318   BILITOT 0.6 03/16/2023 1340   GFRNONAA >60 06/11/2023 0318   GFRAA >90 07/27/2012 1100    Lipase     Component Value Date/Time   LIPASE 19 06/08/2023 1922       Studies/Results: MR ABDOMEN MRCP W WO CONTAST  Result Date: 06/10/2023 CLINICAL DATA:  Abdominal pain, nausea, diarrhea EXAM: MRI ABDOMEN WITHOUT AND WITH CONTRAST (INCLUDING MRCP) TECHNIQUE: Multiplanar multisequence MR imaging of the abdomen was performed both before and after the administration of intravenous contrast. Heavily T2-weighted images of the biliary and pancreatic ducts were obtained, and three-dimensional MRCP images were rendered by post processing. CONTRAST:  7mL GADAVIST GADOBUTROL 1 MMOL/ML IV SOLN COMPARISON:  CTA abdomen/pelvis dated 06/08/2023. CT abdomen/pelvis dated 05/13/2017. FINDINGS: Lower chest: Trace bilateral pleural effusions. Hepatobiliary: Mild hepatic steatosis. No suspicious/enhancing hepatic lesions. Gallbladder is notable for layering small gallstones (series 3/image 26), without associated inflammatory changes. Mild central intrahepatic ductal prominence. Common duct measures 13 mm centrally and mildly tapers at the ampulla. No choledocholithiasis is seen. Pancreas: Segmental ductal dilatation with side branch ectasia in the distal pancreatic tail (series 3/image 17). In retrospect, this appearance chronic dating back to 2018. No associated obstructing pancreatic mass is evident on MR. Spleen:  Within normal limits. Adrenals/Urinary Tract:  Adrenal glands are within normal limits. Two left renal cysts measuring up to 5 mm, benign. Right kidney is within normal limits.  No hydronephrosis. Stomach/Bowel: Stomach is within normal limits. Visualized bowel is notable for scattered colonic diverticulosis, without evidence of diverticulitis. Vascular/Lymphatic:  No evidence of abdominal aortic aneurysm. No suspicious abdominal lymphadenopathy. Other:  No abdominal ascites. Musculoskeletal: Mild degenerative changes of the lumbar spine. IMPRESSION: Cholelithiasis, without associated inflammatory  changes. Mild intrahepatic and extrahepatic ductal dilatation. Common duct measures 13 mm centrally and mildly tapers at the ampulla. No choledocholithiasis is seen. However, in the setting of abnormal LFTs, a nonvisualized distal CBD stone versus stricture is likely. Consider ERCP as clinically warranted. Segmental ductal dilatation with side branch ectasia in the distal pancreatic tail, chronic dating back to 2018. No associated obstructing pancreatic mass is evident on MR. Follow-up MRI abdomen with/without contrast could be performed in 1 year if clinically warranted given the patient's age. Mild hepatic steatosis. Trace bilateral pleural effusions. Electronically Signed   By: Charline Bills M.D.   On: 06/10/2023 00:32   MR 3D Recon At Scanner  Result Date: 06/10/2023 CLINICAL DATA:  Abdominal pain, nausea, diarrhea EXAM: MRI ABDOMEN WITHOUT AND WITH CONTRAST (INCLUDING MRCP) TECHNIQUE: Multiplanar multisequence MR imaging of the abdomen was performed both before and after the administration of intravenous contrast. Heavily T2-weighted images of the biliary and pancreatic ducts were obtained, and three-dimensional MRCP images were rendered by post processing. CONTRAST:  7mL GADAVIST GADOBUTROL 1 MMOL/ML IV SOLN COMPARISON:  CTA abdomen/pelvis dated 06/08/2023. CT abdomen/pelvis dated 05/13/2017. FINDINGS: Lower chest: Trace bilateral pleural effusions. Hepatobiliary: Mild hepatic steatosis. No suspicious/enhancing hepatic lesions. Gallbladder is notable for layering small gallstones (series 3/image 26), without associated inflammatory changes. Mild central intrahepatic ductal prominence. Common duct measures 13 mm centrally and mildly tapers at the ampulla. No choledocholithiasis is seen. Pancreas: Segmental ductal dilatation with side branch ectasia in the distal pancreatic tail (series 3/image 17). In retrospect, this appearance chronic dating back to 2018. No associated obstructing pancreatic mass is  evident on MR. Spleen:  Within normal limits. Adrenals/Urinary Tract:  Adrenal glands are within normal limits. Two left renal cysts measuring up to 5 mm, benign. Right kidney is within normal limits. No hydronephrosis. Stomach/Bowel: Stomach is within normal limits. Visualized bowel is notable for scattered colonic diverticulosis, without evidence of diverticulitis. Vascular/Lymphatic:  No evidence of abdominal aortic aneurysm. No suspicious abdominal lymphadenopathy. Other:  No abdominal ascites. Musculoskeletal: Mild degenerative changes of the lumbar spine. IMPRESSION: Cholelithiasis, without associated inflammatory changes. Mild intrahepatic and extrahepatic ductal dilatation. Common duct measures 13 mm centrally and mildly tapers at the ampulla. No choledocholithiasis is seen. However, in the setting of abnormal LFTs, a nonvisualized distal CBD stone versus stricture is likely. Consider ERCP as clinically warranted. Segmental ductal dilatation with side branch ectasia in the distal pancreatic tail, chronic dating back to 2018. No associated obstructing pancreatic mass is evident on MR. Follow-up MRI abdomen with/without contrast could be performed in 1 year if clinically warranted given the patient's age. Mild hepatic steatosis. Trace bilateral pleural effusions. Electronically Signed   By: Charline Bills M.D.   On: 06/10/2023 00:32   ECHOCARDIOGRAM COMPLETE  Result Date: 06/09/2023    ECHOCARDIOGRAM REPORT   Patient Name:   Catherine Harding [facility] Date of Exam: 06/09/2023 Medical Rec #:  [id number]         Height:       61.0 in Accession #:    [id number]        Weight:       150.0 lb Date of Birth:  1929/10/25  BSA:          1.671 m Patient Age:    89 years          BP:           144/62 mmHg Patient Gender: F                 HR:           77 bpm. Exam Location:  Inpatient Procedure: 2D Echo, Color Doppler and Cardiac Doppler Indications:    Pre-op  History:        Patient has prior history of  Echocardiogram examinations, most                 recent 02/10/2016. Risk Factors:Hypertension.  Sonographer:    Delcie Roch RDCS Referring Phys: [id number] SARA-MAIZ A THOMAS IMPRESSIONS  1. Left ventricular ejection fraction, by estimation, is 60 to 65%. The left ventricle has normal function. The left ventricle has no regional wall motion abnormalities. There is mild left ventricular hypertrophy. Left ventricular diastolic parameters are indeterminate.  2. Right ventricular systolic function is normal. The right ventricular size is normal. There is normal pulmonary artery systolic pressure.  3. Left atrial size was mildly dilated.  4. The mitral valve is degenerative. No evidence of mitral valve regurgitation. No evidence of mitral stenosis.  5. The aortic valve is tricuspid. Aortic valve regurgitation is not visualized. Aortic valve sclerosis is present, with no evidence of aortic valve stenosis.  6. The inferior vena cava is dilated in size with >50% respiratory variability, suggesting right atrial pressure of 8 mmHg.  7. Rhythm strip during this exam demonstrates normal sinus rhythm. Comparison(s): Prior outside echo 03/19/2022 reported: LVEF 59%, Grade 1 diastolic dysfunction, mild to moderate TR, trace AS/MS. FINDINGS  Left Ventricle: Left ventricular ejection fraction, by estimation, is 60 to 65%. The left ventricle has normal function. The left ventricle has no regional wall motion abnormalities. The left ventricular internal cavity size was normal in size. There is  mild left ventricular hypertrophy. Left ventricular diastolic parameters are indeterminate. Right Ventricle: The right ventricular size is normal. No increase in right ventricular wall thickness. Right ventricular systolic function is normal. There is normal pulmonary artery systolic pressure. The tricuspid regurgitant velocity is 2.58 m/s, and  with an assumed right atrial pressure of 8 mmHg, the estimated right ventricular systolic pressure  is 34.6 mmHg. Left Atrium: Left atrial size was mildly dilated. Right Atrium: Right atrial size was normal in size. Pericardium: There is no evidence of pericardial effusion. Mitral Valve: The mitral valve is degenerative in appearance. Mild mitral annular calcification. No evidence of mitral valve regurgitation. No evidence of mitral valve stenosis. Tricuspid Valve: The tricuspid valve is normal in structure. Tricuspid valve regurgitation is mild . No evidence of tricuspid stenosis. Aortic Valve: The aortic valve is tricuspid. Aortic valve regurgitation is not visualized. Aortic valve sclerosis is present, with no evidence of aortic valve stenosis. Aortic valve mean gradient measures 10.0 mmHg. Aortic valve peak gradient measures 17.5 mmHg. Aortic valve area, by VTI measures 1.99 cm. Pulmonic Valve: The pulmonic valve was normal in structure. Pulmonic valve regurgitation is trivial. No evidence of pulmonic stenosis. Aorta: The aortic root and ascending aorta are structurally normal, with no evidence of dilitation. Venous: The inferior vena cava is dilated in size with greater than 50% respiratory variability, suggesting right atrial pressure of 8 mmHg. IAS/Shunts: No atrial level shunt detected by color flow Doppler. EKG: Rhythm strip during this  exam demonstrates normal sinus rhythm.  LEFT VENTRICLE PLAX 2D LVIDd:         4.10 cm   Diastology LVIDs:         2.40 cm   LV e' medial:    7.83 cm/s LV PW:         1.10 cm   LV E/e' medial:  14.3 LV IVS:        1.20 cm   LV e' lateral:   9.46 cm/s LVOT diam:     2.00 cm   LV E/e' lateral: 11.8 LV SV:         84 LV SV Index:   50 LVOT Area:     3.14 cm  RIGHT VENTRICLE             IVC RV Basal diam:  2.40 cm     IVC diam: 2.10 cm RV S prime:     18.30 cm/s TAPSE (M-mode): 2.5 cm LEFT ATRIUM             Index        RIGHT ATRIUM           Index LA diam:        3.70 cm 2.21 cm/m   RA Area:     11.80 cm LA Vol (A2C):   62.8 ml 37.57 ml/m  RA Volume:   24.90 ml  14.90  ml/m LA Vol (A4C):   58.2 ml 34.82 ml/m LA Biplane Vol: 65.0 ml 38.89 ml/m  AORTIC VALVE AV Area (Vmax):    1.89 cm AV Area (Vmean):   1.83 cm AV Area (VTI):     1.99 cm AV Vmax:           209.00 cm/s AV Vmean:          144.000 cm/s AV VTI:            0.419 m AV Peak Grad:      17.5 mmHg AV Mean Grad:      10.0 mmHg LVOT Vmax:         125.50 cm/s LVOT Vmean:        83.900 cm/s LVOT VTI:          0.266 m LVOT/AV VTI ratio: 0.63  AORTA Ao Root diam: 3.00 cm Ao Asc diam:  3.30 cm MITRAL VALVE                TRICUSPID VALVE MV Area (PHT): 3.03 cm     TR Peak grad:   26.6 mmHg MV Decel Time: 250 msec     TR Vmax:        258.00 cm/s MV E velocity: 112.00 cm/s MV A velocity: 137.00 cm/s  SHUNTS MV E/A ratio:  0.82         Systemic VTI:  0.27 m                             Systemic Diam: 2.00 cm Sunit Tolia DO Electronically signed by Tessa Lerner DO Signature Date/Time: 06/09/2023/6:03:04 PM    Final    DG CHEST PORT 1 VIEW  Result Date: 06/09/2023 CLINICAL DATA:  Hypoxemia. EXAM: PORTABLE CHEST 1 VIEW COMPARISON:  02/14/2008 FINDINGS: Low volume film. Cardiopericardial silhouette is at upper limits of normal for size. Interstitial markings are diffusely coarsened with chronic features. Bones are diffusely demineralized. Telemetry leads overlie the chest. IMPRESSION: Low volume film without acute cardiopulmonary findings. Electronically  Signed   By: Kennith Center M.D.   On: 06/09/2023 13:36    Anti-infectives: Anti-infectives (From admission, onward)    Start     Dose/Rate Route Frequency Ordered Stop   06/09/23 1200  piperacillin-tazobactam (ZOSYN) IVPB 3.375 g  Status:  Discontinued        3.375 g 100 mL/hr over 30 Minutes Intravenous Every 6 hours 06/09/23 0846 06/09/23 0857   06/09/23 1000  piperacillin-tazobactam (ZOSYN) IVPB 3.375 g        3.375 g 12.5 mL/hr over 240 Minutes Intravenous Every 8 hours 06/09/23 0857     06/08/23 2300  piperacillin-tazobactam (ZOSYN) IVPB 3.375 g        3.375 g 100  mL/hr over 30 Minutes Intravenous  Once 06/08/23 2248 06/09/23 0009        Assessment/Plan Acute cholecystitis Elevated LFTs - MRCP showed a common bile duct of 13mm centrally with mild tapering at the ampulla, no definite choledocholithiasis seen but concern for nonvisualized distal CBD stone versus stricture; concern for cholecystitis on CT and u/s but no signs of -itis on MRCP - Appreciate GI consult. LFTs are trending down and pain is back to her baseline abdominal pain. Continue conservative measures. Advance to regular diet today. Continue antibiotics. Repeat LFTs in AM.   ID - zosyn VTE - SCDs, sq heparin FEN - IVF, reg diet Foley - none   Muscular dystrophy - nearly wheelchair bound since May 2024 Pseudocholinesterase deficiency HTN PACs IBS  I reviewed Consultant gastroenterology notes, last 24 h vitals and pain scores, last 48 h intake and output, and last 24 h labs and trends.    LOS: 2 days    Franne Forts, [facility] 06/11/2023, 8:56 AM Please see Amion for pager number during day hours 7:00am-4:30pm

## 2023-06-11 NOTE — Progress Notes (Signed)
  Progress Note   [name] ZOX:[id number] DOB: 19-Mar-1930 DOA: 06/08/2023     2 DOS: the patient was seen and examined on 06/11/2023 at 9:59 AM      Brief hospital course: Mrs. Gajewski is a 89 y.o. F with muscular dystrophy, pseudocholinesterase deficiency who presented with RUQ pain and vomiting.       Assessment and Plan: * Transaminitis and right upper quadrant pain LFTs improving, GI have signed off - Continue Zosyn - Trend LFTs -Appreciate GI and general surgery expertise  Benign hypertension BP slightly up - Continue diltiazem - Hold Lasix for now          Subjective: Patient is feeling okay, she had some abdominal cramps after taking her pills this morning, no fever, no confusion, no headache, chest pain, dyspnea.  No nursing concerns.     Physical Exam: BP (!) 155/72 (BP Location: Right Arm)   Pulse 69   Temp 98.4 F (36.9 C) (Oral)   Resp 18   Ht 5\' 1"  (1.549 m)   Wt 72.3 kg   SpO2 93%   BMI 30.12 kg/m   Adult female, sitting up in clinic, interactive and appropriate, no acute distress RRR, no murmurs, normal peripheral edema Respiratory rate normal, lungs clear without rales or wheezes Abdomen with some mild right upper quadrant tenderness, voluntary guarding Attention normal, affect appropriate, judgment and insight appear normal    Data Reviewed: Discussed with gastroenterology LFTs reviewed, improving  Family Communication: Son at the bedside    Disposition: Status is: Inpatient         Author: Alberteen Sam, MD 06/11/2023 3:44 PM  For on call review [email or web address].

## 2023-06-11 NOTE — Progress Notes (Signed)
[facility] ROUNDING NOTE   Subjective: No acute events overnight.  Liver enzymes downtrending.  Did have some abdominal cramping this morning after eating a cracker.  She reports this is more in line with her chronic IBS type cramping and different from the abdominal pain that had brought her to the hospital.   Objective: Vital signs in last 24 hours: Temp:  [97.8 F (36.6 C)-99 F (37.2 C)] 98 F (36.7 C) (08/31 0834) Pulse Rate:  [70-73] 71 (08/31 0834) Resp:  [17-24] 24 (08/31 0834) BP: (151-161)/(62-85) 161/77 (08/31 0834) SpO2:  [90 %-92 %] 92 % (08/31 0834) Last BM Date : 06/10/23 General: NAD Abdomen: Mild generalized TTP.  No rebound, guarding, peritoneal signs.  Soft, ND, +BS    Intake/Output from previous day: 08/30 0701 - 08/31 0700 In: 120 [P.O.:120] Out: 1600 [Urine:1600] Intake/Output this shift: No intake/output data recorded.   Lab Results: Recent Labs    06/08/23 1922 06/09/23 1226 06/10/23 0734  WBC 10.0 10.1 6.2  HGB 14.3 11.2* 10.8*  PLT 235 180 152  MCV 97.5 96.2 96.4   BMET Recent Labs    06/09/23 1226 06/10/23 0734 06/11/23 0318  NA 137 136 133*  K 3.3* 3.0* 3.5  CL 106 107 105  CO2 22 20* 19*  GLUCOSE 120* 131* 131*  BUN 8 7* 7*  CREATININE 0.73 0.77 0.80  CALCIUM 8.5* 8.3* 8.3*   LFT Recent Labs    06/09/23 1226 06/10/23 0734 06/11/23 0318  PROT 5.6* 5.4* 5.4*  ALBUMIN 3.1* 2.9* 2.7*  AST 384* 187* 123*  ALT 317* 232* 187*  ALKPHOS 98 100 123  BILITOT 3.7* 4.6* 3.4*   PT/INR No results for input(s): "INR" in the last 72 hours.    Imaging/Other results: MR ABDOMEN MRCP W WO CONTAST  Result Date: 06/10/2023 CLINICAL DATA:  Abdominal pain, nausea, diarrhea EXAM: MRI ABDOMEN WITHOUT AND WITH CONTRAST (INCLUDING MRCP) TECHNIQUE: Multiplanar multisequence MR imaging of the abdomen was performed both before and after the administration of intravenous contrast. Heavily T2-weighted images of the biliary and  pancreatic ducts were obtained, and three-dimensional MRCP images were rendered by post processing. CONTRAST:  7mL GADAVIST GADOBUTROL 1 MMOL/ML IV SOLN COMPARISON:  CTA abdomen/pelvis dated 06/08/2023. CT abdomen/pelvis dated 05/13/2017. FINDINGS: Lower chest: Trace bilateral pleural effusions. Hepatobiliary: Mild hepatic steatosis. No suspicious/enhancing hepatic lesions. Gallbladder is notable for layering small gallstones (series 3/image 26), without associated inflammatory changes. Mild central intrahepatic ductal prominence. Common duct measures 13 mm centrally and mildly tapers at the ampulla. No choledocholithiasis is seen. Pancreas: Segmental ductal dilatation with side branch ectasia in the distal pancreatic tail (series 3/image 17). In retrospect, this appearance chronic dating back to 2018. No associated obstructing pancreatic mass is evident on MR. Spleen:  Within normal limits. Adrenals/Urinary Tract:  Adrenal glands are within normal limits. Two left renal cysts measuring up to 5 mm, benign. Right kidney is within normal limits. No hydronephrosis. Stomach/Bowel: Stomach is within normal limits. Visualized bowel is notable for scattered colonic diverticulosis, without evidence of diverticulitis. Vascular/Lymphatic:  No evidence of abdominal aortic aneurysm. No suspicious abdominal lymphadenopathy. Other:  No abdominal ascites. Musculoskeletal: Mild degenerative changes of the lumbar spine. IMPRESSION: Cholelithiasis, without associated inflammatory changes. Mild intrahepatic and extrahepatic ductal dilatation. Common duct measures 13 mm centrally and mildly tapers at the ampulla. No choledocholithiasis is seen. However, in the setting of abnormal LFTs, a nonvisualized distal CBD stone versus stricture is likely. Consider ERCP as clinically warranted. Segmental ductal dilatation with side  branch ectasia in the distal pancreatic tail, chronic dating back to 2018. No associated obstructing pancreatic mass  is evident on MR. Follow-up MRI abdomen with/without contrast could be performed in 1 year if clinically warranted given the patient's age. Mild hepatic steatosis. Trace bilateral pleural effusions. Electronically Signed   By: Charline Bills M.D.   On: 06/10/2023 00:32   MR 3D Recon At Scanner  Result Date: 06/10/2023 CLINICAL DATA:  Abdominal pain, nausea, diarrhea EXAM: MRI ABDOMEN WITHOUT AND WITH CONTRAST (INCLUDING MRCP) TECHNIQUE: Multiplanar multisequence MR imaging of the abdomen was performed both before and after the administration of intravenous contrast. Heavily T2-weighted images of the biliary and pancreatic ducts were obtained, and three-dimensional MRCP images were rendered by post processing. CONTRAST:  7mL GADAVIST GADOBUTROL 1 MMOL/ML IV SOLN COMPARISON:  CTA abdomen/pelvis dated 06/08/2023. CT abdomen/pelvis dated 05/13/2017. FINDINGS: Lower chest: Trace bilateral pleural effusions. Hepatobiliary: Mild hepatic steatosis. No suspicious/enhancing hepatic lesions. Gallbladder is notable for layering small gallstones (series 3/image 26), without associated inflammatory changes. Mild central intrahepatic ductal prominence. Common duct measures 13 mm centrally and mildly tapers at the ampulla. No choledocholithiasis is seen. Pancreas: Segmental ductal dilatation with side branch ectasia in the distal pancreatic tail (series 3/image 17). In retrospect, this appearance chronic dating back to 2018. No associated obstructing pancreatic mass is evident on MR. Spleen:  Within normal limits. Adrenals/Urinary Tract:  Adrenal glands are within normal limits. Two left renal cysts measuring up to 5 mm, benign. Right kidney is within normal limits. No hydronephrosis. Stomach/Bowel: Stomach is within normal limits. Visualized bowel is notable for scattered colonic diverticulosis, without evidence of diverticulitis. Vascular/Lymphatic:  No evidence of abdominal aortic aneurysm. No suspicious abdominal  lymphadenopathy. Other:  No abdominal ascites. Musculoskeletal: Mild degenerative changes of the lumbar spine. IMPRESSION: Cholelithiasis, without associated inflammatory changes. Mild intrahepatic and extrahepatic ductal dilatation. Common duct measures 13 mm centrally and mildly tapers at the ampulla. No choledocholithiasis is seen. However, in the setting of abnormal LFTs, a nonvisualized distal CBD stone versus stricture is likely. Consider ERCP as clinically warranted. Segmental ductal dilatation with side branch ectasia in the distal pancreatic tail, chronic dating back to 2018. No associated obstructing pancreatic mass is evident on MR. Follow-up MRI abdomen with/without contrast could be performed in 1 year if clinically warranted given the patient's age. Mild hepatic steatosis. Trace bilateral pleural effusions. Electronically Signed   By: Charline Bills M.D.   On: 06/10/2023 00:32   ECHOCARDIOGRAM COMPLETE  Result Date: 06/09/2023    ECHOCARDIOGRAM REPORT   Patient Name:   Catherine Harding [facility] Date of Exam: 06/09/2023 Medical Rec #:  [id number]         Height:       61.0 in Accession #:    [id number]        Weight:       150.0 lb Date of Birth:  [birth year]        BSA:          1.671 m Patient Age:    89 years          BP:           144/62 mmHg Patient Gender: F                 HR:           77 bpm. Exam Location:  Inpatient Procedure: 2D Echo, Color Doppler and Cardiac Doppler Indications:    Pre-op  History:  Patient has prior history of Echocardiogram examinations, most                 recent 02/10/2016. Risk Factors:Hypertension.  Sonographer:    Delcie Roch RDCS Referring Phys: [id number] SARA-MAIZ A THOMAS IMPRESSIONS  1. Left ventricular ejection fraction, by estimation, is 60 to 65%. The left ventricle has normal function. The left ventricle has no regional wall motion abnormalities. There is mild left ventricular hypertrophy. Left ventricular diastolic parameters are indeterminate.  2.  Right ventricular systolic function is normal. The right ventricular size is normal. There is normal pulmonary artery systolic pressure.  3. Left atrial size was mildly dilated.  4. The mitral valve is degenerative. No evidence of mitral valve regurgitation. No evidence of mitral stenosis.  5. The aortic valve is tricuspid. Aortic valve regurgitation is not visualized. Aortic valve sclerosis is present, with no evidence of aortic valve stenosis.  6. The inferior vena cava is dilated in size with >50% respiratory variability, suggesting right atrial pressure of 8 mmHg.  7. Rhythm strip during this exam demonstrates normal sinus rhythm. Comparison(s): Prior outside echo 03/19/2022 reported: LVEF 59%, Grade 1 diastolic dysfunction, mild to moderate TR, trace AS/MS. FINDINGS  Left Ventricle: Left ventricular ejection fraction, by estimation, is 60 to 65%. The left ventricle has normal function. The left ventricle has no regional wall motion abnormalities. The left ventricular internal cavity size was normal in size. There is  mild left ventricular hypertrophy. Left ventricular diastolic parameters are indeterminate. Right Ventricle: The right ventricular size is normal. No increase in right ventricular wall thickness. Right ventricular systolic function is normal. There is normal pulmonary artery systolic pressure. The tricuspid regurgitant velocity is 2.58 m/s, and  with an assumed right atrial pressure of 8 mmHg, the estimated right ventricular systolic pressure is 34.6 mmHg. Left Atrium: Left atrial size was mildly dilated. Right Atrium: Right atrial size was normal in size. Pericardium: There is no evidence of pericardial effusion. Mitral Valve: The mitral valve is degenerative in appearance. Mild mitral annular calcification. No evidence of mitral valve regurgitation. No evidence of mitral valve stenosis. Tricuspid Valve: The tricuspid valve is normal in structure. Tricuspid valve regurgitation is mild . No  evidence of tricuspid stenosis. Aortic Valve: The aortic valve is tricuspid. Aortic valve regurgitation is not visualized. Aortic valve sclerosis is present, with no evidence of aortic valve stenosis. Aortic valve mean gradient measures 10.0 mmHg. Aortic valve peak gradient measures 17.5 mmHg. Aortic valve area, by VTI measures 1.99 cm. Pulmonic Valve: The pulmonic valve was normal in structure. Pulmonic valve regurgitation is trivial. No evidence of pulmonic stenosis. Aorta: The aortic root and ascending aorta are structurally normal, with no evidence of dilitation. Venous: The inferior vena cava is dilated in size with greater than 50% respiratory variability, suggesting right atrial pressure of 8 mmHg. IAS/Shunts: No atrial level shunt detected by color flow Doppler. EKG: Rhythm strip during this exam demonstrates normal sinus rhythm.  LEFT VENTRICLE PLAX 2D LVIDd:         4.10 cm   Diastology LVIDs:         2.40 cm   LV e' medial:    7.83 cm/s LV PW:         1.10 cm   LV E/e' medial:  14.3 LV IVS:        1.20 cm   LV e' lateral:   9.46 cm/s LVOT diam:     2.00 cm   LV E/e' lateral: 11.8 LV  SV:         84 LV SV Index:   50 LVOT Area:     3.14 cm  RIGHT VENTRICLE             IVC RV Basal diam:  2.40 cm     IVC diam: 2.10 cm RV S prime:     18.30 cm/s TAPSE (M-mode): 2.5 cm LEFT ATRIUM             Index        RIGHT ATRIUM           Index LA diam:        3.70 cm 2.21 cm/m   RA Area:     11.80 cm LA Vol (A2C):   62.8 ml 37.57 ml/m  RA Volume:   24.90 ml  14.90 ml/m LA Vol (A4C):   58.2 ml 34.82 ml/m LA Biplane Vol: 65.0 ml 38.89 ml/m  AORTIC VALVE AV Area (Vmax):    1.89 cm AV Area (Vmean):   1.83 cm AV Area (VTI):     1.99 cm AV Vmax:           209.00 cm/s AV Vmean:          144.000 cm/s AV VTI:            0.419 m AV Peak Grad:      17.5 mmHg AV Mean Grad:      10.0 mmHg LVOT Vmax:         125.50 cm/s LVOT Vmean:        83.900 cm/s LVOT VTI:          0.266 m LVOT/AV VTI ratio: 0.63  AORTA Ao Root diam:  3.00 cm Ao Asc diam:  3.30 cm MITRAL VALVE                TRICUSPID VALVE MV Area (PHT): 3.03 cm     TR Peak grad:   26.6 mmHg MV Decel Time: 250 msec     TR Vmax:        258.00 cm/s MV E velocity: 112.00 cm/s MV A velocity: 137.00 cm/s  SHUNTS MV E/A ratio:  0.82         Systemic VTI:  0.27 m                             Systemic Diam: 2.00 cm Sunit Tolia DO Electronically signed by Tessa Lerner DO Signature Date/Time: 06/09/2023/6:03:04 PM    Final    DG CHEST PORT 1 VIEW  Result Date: 06/09/2023 CLINICAL DATA:  Hypoxemia. EXAM: PORTABLE CHEST 1 VIEW COMPARISON:  02/14/2008 FINDINGS: Low volume film. Cardiopericardial silhouette is at upper limits of normal for size. Interstitial markings are diffusely coarsened with chronic features. Bones are diffusely demineralized. Telemetry leads overlie the chest. IMPRESSION: Low volume film without acute cardiopulmonary findings. Electronically Signed   By: Kennith Center M.D.   On: 06/09/2023 13:36      Assessment and Plan:  1) Elevated liver enzymes-improving 2) Elevated bilirubin-improving Liver enzymes continue to improve.  Pain much improved. - No plan for ERCP at this juncture - Agree with surgical service about if thing diet today - Continue ABX and repeat liver enzymes in the a.m.  3) IBS 4) Abdominal cramping Symptoms today more in line with her chronic IBS type symptoms - Added Levsin 0.125 mg prn (which she takes as an outpatient) - Can follow-up with  her outpatient GI  Inpatient GI service will follow peripherally at this juncture.  Please do not hesitate to contact us with additional questions or concerns    Shellia Cleverly, DO  06/11/2023, 9:47 AM [facility] Pager [phone]

## 2023-06-11 NOTE — Assessment & Plan Note (Signed)
BP slightly up - Continue diltiazem - Hold Lasix for now

## 2023-06-12 DIAGNOSIS — R7401 Elevation of levels of liver transaminase levels: Secondary | ICD-10-CM | POA: Diagnosis not present

## 2023-06-12 LAB — CBC
HCT: 33.5 % — ABNORMAL LOW (ref 36.0–46.0)
Hemoglobin: 11.2 g/dL — ABNORMAL LOW (ref 12.0–15.0)
MCH: 33 pg (ref 26.0–34.0)
MCHC: 33.4 g/dL (ref 30.0–36.0)
MCV: 98.8 fL (ref 80.0–100.0)
Platelets: 178 10*3/uL (ref 150–400)
RBC: 3.39 MIL/uL — ABNORMAL LOW (ref 3.87–5.11)
RDW: 12.8 % (ref 11.5–15.5)
WBC: 5.6 10*3/uL (ref 4.0–10.5)
nRBC: 0 % (ref 0.0–0.2)

## 2023-06-12 LAB — COMPREHENSIVE METABOLIC PANEL
ALT: 147 U/L — ABNORMAL HIGH (ref 0–44)
AST: 79 U/L — ABNORMAL HIGH (ref 15–41)
Albumin: 2.6 g/dL — ABNORMAL LOW (ref 3.5–5.0)
Alkaline Phosphatase: 142 U/L — ABNORMAL HIGH (ref 38–126)
Anion gap: 10 (ref 5–15)
BUN: 5 mg/dL — ABNORMAL LOW (ref 8–23)
CO2: 21 mmol/L — ABNORMAL LOW (ref 22–32)
Calcium: 8.6 mg/dL — ABNORMAL LOW (ref 8.9–10.3)
Chloride: 105 mmol/L (ref 98–111)
Creatinine, Ser: 0.71 mg/dL (ref 0.44–1.00)
GFR, Estimated: >60 mL/min (ref >60)
Glucose, Bld: 190 mg/dL — ABNORMAL HIGH (ref 70–99)
Potassium: 3 mmol/L — ABNORMAL LOW (ref 3.5–5.1)
Sodium: 136 mmol/L (ref 135–145)
Total Bilirubin: 1.8 mg/dL — ABNORMAL HIGH (ref 0.3–1.2)
Total Protein: 5.4 g/dL — ABNORMAL LOW (ref 6.5–8.1)

## 2023-06-12 MED ORDER — ALUM & MAG HYDROXIDE-SIMETH 200-200-20 MG/5ML PO SUSP: 15.0000 mL | Freq: Two times a day (BID) | ORAL | Status: DC | PRN

## 2023-06-12 MED ORDER — FUROSEMIDE 40 MG PO TABS
40.0000 mg | ORAL_TABLET | Freq: Every day | ORAL | Status: DC
  Administered 2023-06-13 – 2023-06-14 (×2): 40 mg via ORAL
  Filled 2023-06-12 (×2): qty 1

## 2023-06-12 MED ORDER — URSODIOL 300 MG PO CAPS
300.0000 mg | ORAL_CAPSULE | Freq: Two times a day (BID) | ORAL | Status: DC
  Administered 2023-06-12 – 2023-06-14 (×4): 300 mg via ORAL
  Filled 2023-06-12 (×8): qty 1

## 2023-06-12 MED ORDER — BOOST / RESOURCE BREEZE PO LIQD CUSTOM
1.0000 | Freq: Three times a day (TID) | ORAL | Status: DC
  Administered 2023-06-12 – 2023-06-14 (×5): 1 via ORAL

## 2023-06-12 MED ORDER — ENOXAPARIN SODIUM 40 MG/0.4ML IJ SOSY
40.0000 mg | PREFILLED_SYRINGE | INTRAMUSCULAR | Status: DC
  Administered 2023-06-13 – 2023-06-14 (×2): 40 mg via SUBCUTANEOUS
  Filled 2023-06-12 (×2): qty 0.4

## 2023-06-12 MED ORDER — POTASSIUM CHLORIDE CRYS ER 20 MEQ PO TBCR
40.0000 meq | EXTENDED_RELEASE_TABLET | Freq: Two times a day (BID) | ORAL | Status: AC
  Administered 2023-06-12: 40 meq via ORAL
  Administered 2023-06-12: 30 meq via ORAL
  Filled 2023-06-12: qty 2
  Filled 2023-06-12: qty 4

## 2023-06-12 NOTE — [place] FL2 (Signed)
[place] MEDICAID FL2 LEVEL OF CARE FORM     IDENTIFICATION  Patient Name: Catherine Harding Birthdate: [birth year] Sex: female Admission Date (Current Location): 06/08/2023  [facility] and [place] Number:  Producer, television/film/video and Address:  [facility], [address], [place], [place] [address]      Provider Number: [id number]  Attending Physician Name and Address:  Alberteen Sam, *  Relative Name and Phone Number:       Current Level of Care: Hospital Recommended Level of Care: Skilled Nursing Facility Prior Approval Number:    Date Approved/Denied:   PASRR Number: [id number]  Discharge Plan: SNF    Current Diagnoses: Patient Active Problem List   Diagnosis Date Noted   PSVT (paroxysmal supraventricular tachycardia) 06/09/2023   RBBB 06/09/2023   Benign hypertension 06/09/2023   Transaminitis and right upper quadrant pain 06/08/2023   Pain in joint of right knee 09/27/2022   Bilateral leg edema 09/10/2022   PAC (premature atrial contraction) 09/10/2022   Quadriceps weakness 06/23/2022   Osteoarthritis of knee 06/23/2022   Pain in joint of left knee 06/02/2022   Abnormality of gait 12/02/2015   Risk for falls 12/02/2015   Sciatica neuralgia 06/10/2015   Scoliosis (and kyphoscoliosis), idiopathic 06/10/2015   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   Cervical dysplasia    IBS (irritable bowel syndrome)    Macular hole of left eye    Essential hypertension    Pseudocholinesterase deficiency    Muscular dystrophy (HCC)    CIN (conjunctival intraepithelial neoplasia)    Osteoporosis    Atrophic vaginitis     Orientation RESPIRATION BLADDER Height & Weight     Self, Time, Place, Situation  Normal Continent Weight: 155 lb 6.8 oz (70.5 kg) Height:  5\' 1"  (154.9 cm)  BEHAVIORAL SYMPTOMS/MOOD NEUROLOGICAL BOWEL NUTRITION STATUS      Continent Diet (heart healthy diet)  AMBULATORY STATUS COMMUNICATION OF NEEDS Skin    Limited Assist Verbally Normal                       Personal Care Assistance Level of Assistance  Bathing, Feeding, Dressing Bathing Assistance: Limited assistance Feeding assistance: Independent Dressing Assistance: Limited assistance     Functional Limitations Info  Sight, Hearing, Speech Sight Info: Adequate Hearing Info: Adequate Speech Info: Adequate    SPECIAL CARE FACTORS FREQUENCY  PT (By licensed PT), OT (By licensed OT)     PT Frequency: 5x5 OT Frequency: 5x            Contractures Contractures Info: Not present    Additional Factors Info  Code Status, Allergies Code Status Info: Full code Allergies Info: Anesthetics, Amide, Ciprofloxacin, Celebrex (Celecoxib), Sulfa Antibiotics, Codeine, Morphine And Codeine           Current Medications (06/12/2023):  This is the current hospital active medication list Current Facility-Administered Medications  Medication Dose Route Frequency Provider Last Rate Last Admin   albuterol (PROVENTIL) (2.5 MG/3ML) 0.083% nebulizer solution 2.5 mg  2.5 mg Nebulization Q2H PRN Lurline Del, MD       diltiazem (CARDIZEM CD) 24 hr capsule 240 mg  240 mg Oral Daily Skip Mayer A, MD   240 mg at 06/12/23 0905   feeding supplement (BOOST / RESOURCE BREEZE) liquid 1 Container  1 Container Oral TID BM Meuth, Brooke A, PA-C   1 Container at 06/12/23 0905   heparin injection 5,000 Units  5,000 Units Subcutaneous Q8H  Lurline Del, MD   5,000 Units at 06/12/23 7829   HYDROmorphone (DILAUDID) injection 0.5 mg  0.5 mg Intravenous Q3H PRN Lurline Del, MD   0.5 mg at 06/11/23 2236   hyoscyamine (LEVSIN SL) SL tablet 0.125 mg  0.125 mg Sublingual Q6H PRN Cirigliano, Vito V, DO       iohexol (OMNIPAQUE) 300 MG/ML solution 100 mL  100 mL Intravenous Once PRN Tegeler, Canary Brim, MD       loperamide (IMODIUM) capsule 2 mg  2 mg Oral QID PRN Meuth, Brooke A, PA-C   2 mg at 06/12/23 0904   ondansetron (ZOFRAN)  tablet 4 mg  4 mg Oral Q6H PRN Lurline Del, MD       Or   ondansetron [facility]) injection 4 mg  4 mg Intravenous Q6H PRN Lurline Del, MD       piperacillin-tazobactam (ZOSYN) IVPB 3.375 g  3.375 g Intravenous Q8H Skip Mayer A, MD 12.5 mL/hr at 06/12/23 0908 3.375 g at 06/12/23 0908   potassium chloride SA (KLOR-CON M) CR tablet 40 mEq  40 mEq Oral BID Alberteen Sam, MD   40 mEq at 06/12/23 5621   ursodiol (ACTIGALL) capsule 300 mg  300 mg Oral BID Meuth, Brooke A, PA-C   300 mg at 06/12/23 3086     Discharge Medications: Please see discharge summary for a list of discharge medications.  Relevant Imaging Results:  Relevant Lab Results:   Additional Information SSN:[id number]  Maree Krabbe, LCSW

## 2023-06-12 NOTE — Evaluation (Signed)
Occupational Therapy Evaluation Patient Details Name: Catherine Harding MRN: [id number] DOB: 11-12-1929 Today's Date: 06/12/2023   History of Present Illness Patient is a 89 y/o female admitted 06/08/23 with R UQ pain and vomiting found to have cholecystitis on CT though no signs on MRCP.  PMH positive for muscular dystrophy, pseudocholinesterase deficiency, HTN, GERD, OA.   Clinical Impression   At baseline, pt completes ADLs Independent to Mod I and typically performs functional transfers/mobility with a Rollator with Mod I. Pt occasionally uses manual wheelchair for functional mobility. At baseline, pt completes light meal prep and light home management tasks Independent to Mod I and receives assistance from family for IADLs as needed. Pt now presents with decreased activity tolerance, generalized B UE weakness, decreased balance during functional tasks, and decreased safety and independence with ADLs and functional mobility/transfers. Pt currently demonstrates ability to complete UB ADLs with Set up, LB ADLs with Contact guard to Min assist, and functional transfers with a RW with Contact guard to Min assist. Pt will benefit from acute skilled OT services to address deficits outlined below and increase safety and independence with ADLs, functional transfers, and functional mobility. Post acute discharge, pt will benefit from intensive inpatient skilled rehab services < 3 hours per day to maximize rehab potential.       If plan is discharge home, recommend the following: A little help with walking and/or transfers;A little help with bathing/dressing/bathroom;Assistance with cooking/housework;Assist for transportation;Help with stairs or ramp for entrance    Functional Status Assessment  Patient has had a recent decline in their functional status and demonstrates the ability to make significant improvements in function in a reasonable and predictable amount of time.  Equipment Recommendations   Other (comment) (defer to next level of care)    Recommendations for Other Services       Precautions / Restrictions Precautions Precautions: Fall Restrictions Weight Bearing Restrictions: No      Mobility Bed Mobility Overal bed mobility: Needs Assistance             General bed mobility comments: Pt sitting in recliner at beginning and end of session.    Transfers Overall transfer level: Needs assistance Equipment used: Rolling walker (2 wheels) Transfers: Sit to/from Stand, Bed to chair/wheelchair/BSC Sit to Stand: Contact guard assist (and cues for hand placement) Stand pivot transfers: Contact guard assist, Min assist                Balance Overall balance assessment: Needs assistance Sitting-balance support: No upper extremity supported, Feet supported Sitting balance-Leahy Scale: Good     Standing balance support: Single extremity supported, Bilateral upper extremity supported, During functional activity, Reliant on assistive device for balance Standing balance-Leahy Scale: Poor                             ADL either performed or assessed with clinical judgement   ADL Overall ADL's : Needs assistance/impaired Eating/Feeding: Set up;Sitting   Grooming: Set up;Sitting   Upper Body Bathing: Set up;Sitting   Lower Body Bathing: Minimal assistance;Sit to/from stand   Upper Body Dressing : Set up;Sitting   Lower Body Dressing: Contact guard assist;Sit to/from stand   Toilet Transfer: Contact guard assist;Minimal assistance;Cueing for safety;BSC/3in1;Rolling walker (2 wheels)   Toileting- Clothing Manipulation and Hygiene: Minimal assistance;Sit to/from stand         General ADL Comments: Pt with decreased activity tolerance during functional tasks.  Vision Baseline Vision/History: 6 Macular Degeneration Ability to See in Adequate Light: 1 Impaired Patient Visual Report: No change from baseline       Perception          Praxis         Pertinent Vitals/Pain Pain Assessment Pain Assessment: Faces Faces Pain Scale: Hurts little more Pain Location: baseline R lower quadrant pain that radiates down toward knee Pain Descriptors / Indicators: Discomfort Pain Intervention(s): Monitored during session, Limited activity within patient's tolerance     Extremity/Trunk Assessment Upper Extremity Assessment Upper Extremity Assessment: Generalized weakness;Right hand dominant;RUE deficits/detail;LUE deficits/detail RUE Deficits / Details: generalized weakness, history of rotator cuff injury LUE Deficits / Details: generalized weakness   Lower Extremity Assessment Lower Extremity Assessment: Defer to PT evaluation       Communication Communication Communication: Hearing impairment Cueing Techniques: Visual cues;Tactile cues   Cognition Arousal: Alert Behavior During Therapy: WFL for tasks assessed/performed Overall Cognitive Status: Within Functional Limits for tasks assessed                                 General Comments: AAOx4 and pleasant throughout. demonstrates good insight into deficits. Able to follow 2-3 steps commands consistantly.     General Comments  VSS on RA throughout session. Son and daughter-in-law present during a portion of session.    Exercises     Shoulder Instructions      Home Living Family/patient expects to be discharged to:: Private residence Living Arrangements: Alone Available Help at Discharge: Family;Available PRN/intermittently Type of Home: House Home Access: Level entry     Home Layout: One level;Laundry or work area in basement;Able to live on main level with bedroom/bathroom Alternate Teacher, music of Steps: does not go to basement   [employer] Shower/Tub: Chief Strategy Officer: Standard     Home Equipment: Grab bars - tub/shower;Tub bench;Rollator (4 wheels);Wheelchair - manual;Transport chair;BSC/3in1;Cane - single  point;Other (comment) (life alert system)          Prior Functioning/Environment Prior Level of Function : Independent/Modified Independent             Mobility Comments: At baseline, pt typically uses a Rollator for funcitonal mobility in the home but occasionally uses a manual wheelchair. ADLs Comments: Pt Independent to Mod I with ADLs, light meal prep, and light home management tasks. Pt occasionally drives to nearby places, such as the salon. Pt's son and daughter assist with IADLs as needed.        OT Problem List: Decreased strength;Decreased activity tolerance;Impaired balance (sitting and/or standing);Decreased knowledge of use of DME or AE      OT Treatment/Interventions: Self-care/ADL training;Therapeutic exercise;Energy conservation;DME and/or AE instruction;Therapeutic activities;Patient/family education;Balance training    OT Goals(Current goals can be found in the care plan section) Acute Rehab OT Goals Patient Stated Goal: To be as independent as possible OT Goal Formulation: With patient Time For Goal Achievement: 06/26/23 Potential to Achieve Goals: Good ADL Goals Pt Will Perform Lower Body Bathing: with supervision;sitting/lateral leans;sit to/from stand Pt Will Perform Lower Body Dressing: with supervision;sitting/lateral leans;sit to/from stand Pt Will Transfer to Toilet: with supervision;ambulating;regular height toilet (with least restrictive AD) Pt Will Perform Toileting - Clothing Manipulation and hygiene: with supervision;sitting/lateral leans;sit to/from stand Pt/caregiver will Perform Home Exercise Program: Increased strength;Both right and left upper extremity;With theraband;With theraputty;With Supervision;With written HEP provided (Increased activity tolerance)  OT Frequency: Min 1X/week  Co-evaluation              AM-PAC OT "6 Clicks" Daily Activity     Outcome Measure Help from another person eating meals?: A Little Help from another  person taking care of personal grooming?: A Little Help from another person toileting, which includes using toliet, bedpan, or urinal?: A Little Help from another person bathing (including washing, rinsing, drying)?: A Little Help from another person to put on and taking off regular upper body clothing?: A Little Help from another person to put on and taking off regular lower body clothing?: A Little 6 Click Score: 18   End of Session Equipment Utilized During Treatment: Rolling walker (2 wheels) Nurse Communication: Mobility status  Activity Tolerance: Patient tolerated treatment well Patient left: in chair;with call bell/phone within reach;with chair alarm set;with family/visitor present  OT Visit Diagnosis: Unsteadiness on feet (R26.81);Muscle weakness (generalized) (M62.81);Other (comment) (Decreased activity tolerance)                Time: 2355-7322 OT Time Calculation (min): 28 min Charges:  OT General Charges $OT Visit: 1 Visit OT Evaluation $OT Eval Moderate Complexity: 1 Mod  [address] State LaneMolson Coors Brewing., OTR/L, MA Acute Rehab [phone]   Lendon Colonel 06/12/2023, 5:48 PM

## 2023-06-12 NOTE — TOC Initial Note (Addendum)
Transition of Care [facility]) - Initial/Assessment Note    Patient Details  Name: Catherine Harding MRN: [id number] Date of Birth: [birth year]  Transition of Care [facility]) CM/SW Contact:    Maree Krabbe, LCSW Phone Number: 06/12/2023, 11:07 AM  Clinical Narrative:    SW met with patient at bedside. Pt's son also present. Pt's is agreeable to SNF but determined to return home after rehab. Pt's son states pt has rollator, wheelchair, potty chair, walkers, and the house is modified. Mbr is also active with [facility], and HHOT. Pt would prefer to go to [facility] and second choice would be [facility].  SW reached out to [place] at [place] and she can accept pt tomorrow. CM started auth through [facility] for admit tomorrow.                Expected Discharge Plan: Skilled Nursing Facility Barriers to Discharge: Insurance Authorization   Patient Goals and CMS Choice Patient states their goals for this hospitalization and ongoing recovery are:: to get rehab prior to discharging home CMS Medicare.gov Compare Post Acute Care list provided to:: Patient Choice offered to / list presented to : Patient      Expected Discharge Plan and Services In-house Referral: Clinical Social Work   Post Acute Care Choice: Skilled Nursing Facility Living arrangements for the past 2 months: Single Family Home                   DME Agency: NA                  Prior Living Arrangements/Services Living arrangements for the past 2 months: Single Family Home Lives with:: Self Patient language and need for interpreter reviewed:: Yes Do you feel safe going back to the place where you live?: Yes      Need for Family Participation in Patient Care: Yes (Comment) Care giver support system in place?: Yes (comment) Current home services: Home PT, Home OT Criminal Activity/Legal Involvement Pertinent to Current Situation/Hospitalization: No - Comment as needed  Activities of Daily Living Home  Assistive Devices/Equipment: Wheelchair, Environmental consultant (specify type) ADL Screening (condition at time of admission) Patient's cognitive ability adequate to safely complete daily activities?: Yes Is the patient deaf or have difficulty hearing?: Yes Does the patient have difficulty seeing, even when wearing glasses/contacts?: No Does the patient have difficulty concentrating, remembering, or making decisions?: No Patient able to express need for assistance with ADLs?: Yes Does the patient have difficulty dressing or bathing?: No Independently performs ADLs?: Yes (appropriate for developmental age) Does the patient have difficulty walking or climbing stairs?: Yes Weakness of Legs: Both Weakness of Arms/Hands: None  Permission Sought/Granted Permission sought to share information with : Facility Industrial/product designer granted to share information with : Yes, Release of Information Signed  Share Information with NAME: Viviann Spare  Permission granted to share info w AGENCY: [name]  Permission granted to share info w Relationship: son  Permission granted to share info w Contact Information: [phone]  Emotional Assessment Appearance:: Appears stated age Attitude/Demeanor/Rapport: Engaged, Self-Confident Affect (typically observed): Appropriate, Calm Orientation: : Oriented to Self, Oriented to Place, Oriented to  Time, Oriented to Situation Alcohol / Substance Use: Not Applicable Psych Involvement: No (comment)  Admission diagnosis:  Acute cholecystitis [K81.0] Cholecystitis [K81.9] Pain of upper abdomen [R10.10] Patient Active Problem List   Diagnosis Date Noted   PSVT (paroxysmal supraventricular tachycardia) 06/09/2023   RBBB 06/09/2023   Benign hypertension 06/09/2023   Transaminitis and right upper  quadrant pain 06/08/2023   Pain in joint of right knee 09/27/2022   Bilateral leg edema 09/10/2022   PAC (premature atrial contraction) 09/10/2022   Quadriceps weakness  06/23/2022   Osteoarthritis of knee 06/23/2022   Pain in joint of left knee 06/02/2022   Abnormality of gait 12/02/2015   Risk for falls 12/02/2015   Sciatica neuralgia 06/10/2015   Scoliosis (and kyphoscoliosis), idiopathic 06/10/2015   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   Cervical dysplasia    IBS (irritable bowel syndrome)    Macular hole of left eye    Essential hypertension    Pseudocholinesterase deficiency    Muscular dystrophy (HCC)    CIN (conjunctival intraepithelial neoplasia)    Osteoporosis    Atrophic vaginitis    PCP:  Adrian Prince, MD Pharmacy:   [id number] [place], [place] - [address] [place] [address] Phone: [phone] Fax: [phone]     Social Determinants of Health (SDOH) Social History: SDOH Screenings   Food Insecurity: No Food Insecurity (06/09/2023)  Housing: Low Risk  (06/09/2023)  Transportation Needs: No Transportation Needs (06/09/2023)  Utilities: Not At Risk (06/09/2023)  Depression (PHQ2-9): Low Risk  (08/04/2019)  Tobacco Use: Low Risk  (06/08/2023)   SDOH Interventions:     Readmission Risk Interventions     No data to display

## 2023-06-12 NOTE — Progress Notes (Signed)
[facility] Progress Note     Subjective: CC-  Continues to have her chronic abdominal pain, mostly in the lower abdomen. Tolerated solid food yesterday without recurrence of upper abdominal pain that brought her into the hospital. Denies n/v. LFTs continue to trend down.  Objective: Vital signs in last 24 hours: Temp:  [97.5 F (36.4 C)-98.6 F (37 C)] 98.2 F (36.8 C) (09/01 0840) Pulse Rate:  [60-79] 60 (09/01 0840) Resp:  [16-20] 20 (09/01 0510) BP: (145-180)/(69-90) 180/81 (09/01 0840) SpO2:  [93 %-95 %] 94 % (09/01 0510) Weight:  [70.5 kg] 70.5 kg (09/01 0510) Last BM Date : 06/10/23  Intake/Output from previous day: 08/31 0701 - 09/01 0700 In: 580 [P.O.:580] Out: 750 [Urine:750] Intake/Output this shift: No intake/output data recorded.  PE: Gen:  Alert, NAD, pleasant Abd: soft, ND, minimal lower abdominal TTP R>L  Lab Results:  Recent Labs    06/10/23 0734 06/12/23 0316  WBC 6.2 5.6  HGB 10.8* 11.2*  HCT 32.3* 33.5*  PLT 152 178   BMET Recent Labs    06/11/23 0318 06/12/23 0316  NA 133* 136  K 3.5 3.0*  CL 105 105  CO2 19* 21*  GLUCOSE 131* 190*  BUN 7* 5*  CREATININE 0.80 0.71  CALCIUM 8.3* 8.6*   PT/INR No results for input(s): "LABPROT", "INR" in the last 72 hours. CMP     Component Value Date/Time   NA 136 06/12/2023 0316   NA 139 03/16/2023 1340   K 3.0 (L) 06/12/2023 0316   CL 105 06/12/2023 0316   CO2 21 (L) 06/12/2023 0316   GLUCOSE 190 (H) 06/12/2023 0316   BUN 5 (L) 06/12/2023 0316   BUN 16 03/16/2023 1340   CREATININE 0.71 06/12/2023 0316   CREATININE 0.76 08/28/2012 1249   CALCIUM 8.6 (L) 06/12/2023 0316   CALCIUM 10.1 08/28/2012 1249   PROT 5.4 (L) 06/12/2023 0316   PROT 6.6 03/16/2023 1340   ALBUMIN 2.6 (L) 06/12/2023 0316   ALBUMIN 4.1 03/16/2023 1340   AST 79 (H) 06/12/2023 0316   ALT 147 (H) 06/12/2023 0316   ALKPHOS 142 (H) 06/12/2023 0316   BILITOT 1.8 (H) 06/12/2023 0316   BILITOT 0.6 03/16/2023  1340   GFRNONAA >60 06/12/2023 0316   GFRAA >90 07/27/2012 1100   Lipase     Component Value Date/Time   LIPASE 19 06/08/2023 1922       Studies/Results: No results found.  Anti-infectives: Anti-infectives (From admission, onward)    Start     Dose/Rate Route Frequency Ordered Stop   06/09/23 1200  piperacillin-tazobactam (ZOSYN) IVPB 3.375 g  Status:  Discontinued        3.375 g 100 mL/hr over 30 Minutes Intravenous Every 6 hours 06/09/23 0846 06/09/23 0857   06/09/23 1000  piperacillin-tazobactam (ZOSYN) IVPB 3.375 g        3.375 g 12.5 mL/hr over 240 Minutes Intravenous Every 8 hours 06/09/23 0857     06/08/23 2300  piperacillin-tazobactam (ZOSYN) IVPB 3.375 g        3.375 g 100 mL/hr over 30 Minutes Intravenous  Once 06/08/23 2248 06/09/23 0009        Assessment/Plan Acute cholecystitis Elevated LFTs - MRCP showed a common bile duct of 13mm centrally with mild tapering at the ampulla, no definite choledocholithiasis seen but concern for nonvisualized distal CBD stone versus stricture; concern for cholecystitis on CT and u/s but no signs of -itis on MRCP - Appreciate GI consult. LFTs continue to trend  down and pain is back to her baseline abdominal pain. Tolerating solid food. Continue conservative measures. Continue antibiotics for a course of 7 days total. Will trial a course of ursodiol. Patient is looking into SNF. Ok for discharge from surgical standpoint. Recommend close follow up with her outpatient GI. We will follow peripherally. Please call with questions or concerns.    ID - zosyn VTE - SCDs, sq heparin FEN - IVF, reg diet Foley - none   Muscular dystrophy - nearly wheelchair bound since May 2024 Pseudocholinesterase deficiency HTN PACs IBS  I reviewed Consultant gastroenterology notes, last 24 h vitals and pain scores, last 48 h intake and output, and last 24 h labs and trends.    LOS: 3 days    Franne Forts, [facility]  Surgery 06/12/2023, 8:41 AM Please see Amion for pager number during day hours 7:00am-4:30pm

## 2023-06-12 NOTE — Plan of Care (Signed)
?  Problem: Clinical Measurements: ?Goal: Will remain free from infection ?Outcome: Progressing ?  ?Problem: Clinical Measurements: ?Goal: Diagnostic test results will improve ?Outcome: Progressing ?  ?

## 2023-06-12 NOTE — Progress Notes (Signed)
  Progress Note   [name] ZOX:[id number] DOB: 09-07-1930 DOA: 06/08/2023     3 DOS: the patient was seen and examined on 06/12/2023 at 10:15AM      Brief hospital course: Catherine Harding is a 89 y.o. F with muscular dystrophy, pseudocholinesterase deficiency who presented with RUQ pain and vomiting.       Assessment and Plan: *Suspected choledocholithiasis with ascending cholangitis  There has been some debate about the imaging findings and clinical exam, however in general we are treating this as if the patient has choledocholithiasis with a passed stone and some degree of cholangitis.    ERCP was deferred only due to the risk involved given her age and pseudocholinesterase deficiency. - Continue IV antibiotics - Continue to trend LFTs - Appreciate general surgery and GI consultation    Benign hypertension BP elevated - Continue diltiazem - Resume home Lasix   Pseudocholinesterase deficiency My understanding is that this increases the risk of anesthesia, but I have no idea to what extent that sort of risk can be mitigated.  IBS - Continue home Levsin      Subjective: Patient's abdominal pain is gradually improving.  She has had no fever, no vomiting, no confusion.     Physical Exam: BP (!) 156/75 (BP Location: Right Arm)   Pulse 61   Temp 97.7 F (36.5 C) (Oral)   Resp 19   Ht 5\' 1"  (1.549 m)   Wt 70.5 kg   SpO2 96%   BMI 29.37 kg/m   Elderly adult female, sitting in bed, interactive and appropriate, RRR, no murmurs, no peripheral edema Respiratory rate normal, lungs clear without rales or wheezes There is mild tenderness to palpation of the right upper quadrant, but as before, no significant guarding/rigidity, no distention Attention normal, affect appropriate, judgment and insight appear normal    Data Reviewed: LFTs improving  Family Communication: Son at the bedside    Disposition: Status is:  Inpatient         Author: Alberteen Sam, MD 06/12/2023 5:03 PM  For on call review [email or web address].

## 2023-06-12 NOTE — TOC Progression Note (Signed)
Pt and pt's son updated that [name] tomorrow if [name] has been received. Auth started today with admit to SNF tomorrow.

## 2023-06-12 NOTE — TOC Progression Note (Signed)
Transition of Care [facility]) - Progression Note    Patient Details  Name: Catherine Harding MRN: [id number] Date of Birth: 19-Dec-1929  [facility] CM/SW Contact  Maree Krabbe, LCSW Phone Number: 06/12/2023, 3:55 PM  Clinical Narrative:   SW notified that [facility] denied auth and is offering a peer to peer. Information provided to MD.    Expected Discharge Plan: Skilled Nursing Facility Barriers to Discharge: Insurance Authorization  Expected Discharge Plan and Services In-house Referral: Clinical Social Work   Post Acute Care Choice: Skilled Nursing Facility Living arrangements for the past 2 months: Single Family Home                   DME Agency: NA                   Social Determinants of Health (SDOH) Interventions SDOH Screenings   Food Insecurity: No Food Insecurity (06/09/2023)  Housing: Low Risk  (06/09/2023)  Transportation Needs: No Transportation Needs (06/09/2023)  Utilities: Not At Risk (06/09/2023)  Depression (PHQ2-9): Low Risk  (08/04/2019)  Tobacco Use: Low Risk  (06/08/2023)    Readmission Risk Interventions     No data to display

## 2023-06-13 DIAGNOSIS — R7401 Elevation of levels of liver transaminase levels: Secondary | ICD-10-CM | POA: Diagnosis not present

## 2023-06-13 LAB — COMPREHENSIVE METABOLIC PANEL
ALT: 116 U/L — ABNORMAL HIGH (ref 0–44)
AST: 53 U/L — ABNORMAL HIGH (ref 15–41)
Albumin: 2.5 g/dL — ABNORMAL LOW (ref 3.5–5.0)
Alkaline Phosphatase: 137 U/L — ABNORMAL HIGH (ref 38–126)
Anion gap: 3 — ABNORMAL LOW (ref 5–15)
BUN: 7 mg/dL — ABNORMAL LOW (ref 8–23)
CO2: 22 mmol/L (ref 22–32)
Calcium: 8.2 mg/dL — ABNORMAL LOW (ref 8.9–10.3)
Chloride: 110 mmol/L (ref 98–111)
Creatinine, Ser: 0.77 mg/dL (ref 0.44–1.00)
GFR, Estimated: >60 mL/min (ref >60)
Glucose, Bld: 133 mg/dL — ABNORMAL HIGH (ref 70–99)
Potassium: 3.5 mmol/L (ref 3.5–5.1)
Sodium: 135 mmol/L (ref 135–145)
Total Bilirubin: 1.4 mg/dL — ABNORMAL HIGH (ref 0.3–1.2)
Total Protein: 5.3 g/dL — ABNORMAL LOW (ref 6.5–8.1)

## 2023-06-13 LAB — CBC
HCT: 33 % — ABNORMAL LOW (ref 36.0–46.0)
Hemoglobin: 10.8 g/dL — ABNORMAL LOW (ref 12.0–15.0)
MCH: 32.1 pg (ref 26.0–34.0)
MCHC: 32.7 g/dL (ref 30.0–36.0)
MCV: 98.2 fL (ref 80.0–100.0)
Platelets: 181 10*3/uL (ref 150–400)
RBC: 3.36 MIL/uL — ABNORMAL LOW (ref 3.87–5.11)
RDW: 12.9 % (ref 11.5–15.5)
WBC: 6 10*3/uL (ref 4.0–10.5)
nRBC: 0 % (ref 0.0–0.2)

## 2023-06-13 NOTE — TOC Progression Note (Signed)
Transition of Care [facility]) - Progression Note    Patient Details  Name: Catherine Harding MRN: [id number] Date of Birth: 08-Feb-1930  [facility] of [facility]) CM/SW Contact  Leander Rams, LCSW Phone Number: 06/13/2023, 12:18 PM  Clinical Narrative:    CSW spoke with Crystal from [facility] regarding pt insurance auth. [facility] requested to see additional clinical documentation. Crystal reports fax is not needed and she will access pt chart via EMR. CSW called and informed [facility] that clinicals have been updated for review.   TOC will continue to follow.    Expected Discharge Plan: Skilled Nursing Facility Barriers to Discharge: Insurance Authorization  Expected Discharge Plan and Services In-house Referral: Clinical Social Work   Post Acute Care Choice: Skilled Nursing Facility Living arrangements for the past 2 months: Single Family Home                   DME Agency: NA                   Social Determinants of Health (SDOH) Interventions SDOH Screenings   Food Insecurity: No Food Insecurity (06/09/2023)  Housing: Low Risk  (06/09/2023)  Transportation Needs: No Transportation Needs (06/09/2023)  Utilities: Not At Risk (06/09/2023)  Depression (PHQ2-9): Low Risk  (08/04/2019)  Tobacco Use: Low Risk  (06/08/2023)    Readmission Risk Interventions     No data to display        Oletta Lamas, MSW, LCSWA, LCASA Transitions of Care  Clinical Social Worker I

## 2023-06-13 NOTE — Care Management Important Message (Signed)
Important Message  Patient Details  Name: Catherine Harding MRN: [id number] Date of Birth: 1930-02-12   Medicare Important Message Given:  Yes     Renie Ora 06/13/2023, 9:57 AM

## 2023-06-13 NOTE — Progress Notes (Signed)
  Progress Note   [name]:[id number] DOB: [birth year] DOA: 06/08/2023     4 DOS: the patient was seen and examined on 06/13/2023 at 8:13 AM      Brief hospital course: Mrs. Shoen is a 89 y.o. F with muscular dystrophy, pseudocholinesterase deficiency who presented with RUQ pain and vomiting.       Assessment and Plan: * Suspected choledocholithiasis with ascending cholangitis  There has been some debate about the imaging findings and clinical exam, however in general we are treating this as if the patient has choledocholithiasis with a passed stone and some degree of cholangitis.     ERCP was deferred only due to the risk involved given her age and pseudocholinesterase deficiency.  White count resolved, symptoms improving. - Continue IV antibiotics, plan to complete 5 to 7 days total antibiotics, transition to oral at discharge - Appreciate general surgery and GI consultation  Benign hypertension BP slightly up - Continue diltiazem and Lasix  Pseudocholinesterase deficiency My understanding is that this increases the risk of anesthesia, but I have no idea to what extent that sort of risk can be mitigated.   IBS - Continue home Levsin        Subjective: Patient is generally weak but improving.     Physical Exam: BP (!) 145/81 (BP Location: Right Arm)   Pulse 61   Temp 97.8 F (36.6 C) (Oral)   Resp 17   Ht 5\' 1"  (1.549 m)   Wt 69.8 kg   SpO2 97%   BMI 29.08 kg/m   Elderly adult female, sitting up in recliner, interactive and appropriate, eating breakfast RRR, no murmurs, no peripheral edema RRR, no murmurs, no peripheral edema Respiratory normal, lungs clear without rales or wheezes Attention normal, affect appropriate, judgment and insight appear normal  Data Reviewed: Comprehensive metabolic panel shows improving LFTs, white blood cells normal  Family Communication: None present    Disposition: Status is:  Inpatient         Author: Alberteen Sam, MD 06/13/2023 3:53 PM  For on call review [email or web address].

## 2023-06-13 NOTE — Plan of Care (Signed)

## 2023-06-13 NOTE — Progress Notes (Signed)
Physical Therapy Treatment Patient Details Name: Catherine Harding MRN: [id number] DOB: September 16, 1930 Today's Date: 06/13/2023   History of Present Illness Patient is a 89 y/o female admitted 06/08/23 with R UQ pain and vomiting found to have cholecystitis on CT though no signs on MRCP.  PMH positive for muscular dystrophy, pseudocholinesterase deficiency, HTN, GERD, OA.    PT Comments  Pt is progressing toward her goals, but is still significantly different from her baseline over the last week.  Pt does report the need now for both the w/c and rollator, the rollator being necessary for meal prep and activities in/around the kitchen, doing laundry in a tight utility area, and significantly helpful for getting to/and using the computer as well as functionally using the bathroom area.  Presently, her ability to stand and transfer is compromised by significant and progressed weakness and her ability to walk short distances (~ 10 to 15 feet) is not safe, but necessary in several cases.  Pt also can not get to the car for friends/family to get her out for errands as the w/c doesn't work for the present system of getting to the car in the garage.  The progressive nature of this acute event superimposed on her chronic muscular dystrophy warrants a short term stay in the community with < 3 hours daily schedule of therapy to improve function in order to manage home alone with her unique needs.  She will also likely need further in home follow up to tweak the improvements gained in rehab.   If plan is discharge home, recommend the following: A little help with walking and/or transfers;Assistance with cooking/housework;Assist for transportation;A little help with bathing/dressing/bathroom;Help with stairs or ramp for entrance   Can travel by private vehicle     Yes  Equipment Recommendations  None recommended by PT    Recommendations for Other Services       Precautions / Restrictions  Precautions Precautions: Fall Restrictions Weight Bearing Restrictions: No     Mobility  Bed Mobility               General bed mobility comments: in the chair on arrival    Transfers Overall transfer level: Needs assistance Equipment used: Rolling walker (2 wheels) Transfers: Sit to/from Stand, Bed to chair/wheelchair/BSC Sit to Stand: Contact guard assist (x5, each trial was laborious with notable fatigue and more difficulty with each trial.) Stand pivot transfers: Contact guard assist, Min assist (in RW, notably unsteady enough to warrant stability assist.)              Ambulation/Gait Ambulation/Gait assistance: Contact guard assist, Min assist Gait Distance (Feet): 8 Feet (with trun arround using RW) Assistive device: Rolling walker (2 wheels) Gait Pattern/deviations: Step-to pattern, Decreased step length - right, Decreased step length - left, Decreased stance time - right, Decreased stride length   Gait velocity interpretation: <1.31 ft/sec, indicative of household ambulator   General Gait Details: very short, shuffled , laborious steps with little discernable flex in the knees due to weakness.  Stability assist given for instability weakness and fatigue.   Stairs             Wheelchair Mobility     Tilt Bed    Modified Rankin (Stroke Patients Only)       Balance Overall balance assessment: Needs assistance Sitting-balance support: No upper extremity supported, Feet supported Sitting balance-Leahy Scale: Good     Standing balance support: Single extremity supported, Bilateral upper extremity supported, During functional activity Standing  balance-Leahy Scale: Poor Standing balance comment: UE support and CGA                            Cognition Arousal: Alert Behavior During Therapy: WFL for tasks assessed/performed Overall Cognitive Status: Within Functional Limits for tasks assessed                                  General Comments: AAOx4 and pleasant throughout. demonstrates good insight into deficits. Able to follow 2-3 steps commands consistantly.        Exercises Other Exercises Other Exercises: open chain LE exercise hip flex, knee ext (LAQ).  R LE notably weaker, effortful.    General Comments General comments (skin integrity, edema, etc.): VSS on RA, Son and DIL present at end of the session.      Pertinent Vitals/Pain Pain Assessment Pain Assessment: Faces Faces Pain Scale: Hurts little more Pain Location: genereal R knee, joints, lower quadrant Pain Descriptors / Indicators: Discomfort, Sore Pain Intervention(s): Monitored during session    Home Living                          Prior Function            PT Goals (current goals can now be found in the care plan section) Acute Rehab PT Goals Patient Stated Goal: to be home  (but be able to function safely) PT Goal Formulation: With patient/family Time For Goal Achievement: 06/25/23 Potential to Achieve Goals: Good Progress towards PT goals: Progressing toward goals    Frequency    Min 1X/week      PT Plan      Co-evaluation              AM-PAC PT "6 Clicks" Mobility   Outcome Measure  Help needed turning from your back to your side while in a flat bed without using bedrails?: A Little Help needed moving from lying on your back to sitting on the side of a flat bed without using bedrails?: A Little Help needed moving to and from a bed to a chair (including a wheelchair)?: A Little Help needed standing up from a chair using your arms (e.g., wheelchair or bedside chair)?: A Little Help needed to walk in hospital room?: Total Help needed climbing 3-5 steps with a railing? : A Lot 6 Click Score: 15    End of Session   Activity Tolerance: Patient limited by fatigue Patient left: in chair;with call bell/phone within reach;with family/visitor present Nurse Communication: Mobility status PT  Visit Diagnosis: Other abnormalities of gait and mobility (R26.89);Muscle weakness (generalized) (M62.81)     Time: 8469-6295 PT Time Calculation (min) (ACUTE ONLY): 53 min  Charges:    $Gait Training: 8-22 mins $Therapeutic Exercise: 8-22 mins $Therapeutic Activity: 8-22 mins PT General Charges $$ ACUTE PT VISIT: 1 Visit                     06/13/2023  Jacinto Halim., PT Acute Rehabilitation Services [phone]  (office)   Eliseo Gum Carl Butner 06/13/2023, 11:48 AM

## 2023-06-14 DIAGNOSIS — I10 Essential (primary) hypertension: Secondary | ICD-10-CM | POA: Diagnosis not present

## 2023-06-14 DIAGNOSIS — M4120 Other idiopathic scoliosis, site unspecified: Secondary | ICD-10-CM | POA: Diagnosis not present

## 2023-06-14 DIAGNOSIS — K810 Acute cholecystitis: Secondary | ICD-10-CM | POA: Diagnosis not present

## 2023-06-14 DIAGNOSIS — E876 Hypokalemia: Secondary | ICD-10-CM | POA: Diagnosis not present

## 2023-06-14 DIAGNOSIS — R41841 Cognitive communication deficit: Secondary | ICD-10-CM | POA: Diagnosis not present

## 2023-06-14 DIAGNOSIS — K819 Cholecystitis, unspecified: Secondary | ICD-10-CM | POA: Diagnosis not present

## 2023-06-14 DIAGNOSIS — L03116 Cellulitis of left lower limb: Secondary | ICD-10-CM | POA: Diagnosis not present

## 2023-06-14 DIAGNOSIS — R7401 Elevation of levels of liver transaminase levels: Secondary | ICD-10-CM | POA: Diagnosis not present

## 2023-06-14 DIAGNOSIS — K589 Irritable bowel syndrome without diarrhea: Secondary | ICD-10-CM | POA: Diagnosis not present

## 2023-06-14 DIAGNOSIS — R2681 Unsteadiness on feet: Secondary | ICD-10-CM | POA: Diagnosis not present

## 2023-06-14 DIAGNOSIS — E8809 Other disorders of plasma-protein metabolism, not elsewhere classified: Secondary | ICD-10-CM

## 2023-06-14 DIAGNOSIS — R278 Other lack of coordination: Secondary | ICD-10-CM | POA: Diagnosis not present

## 2023-06-14 DIAGNOSIS — M6281 Muscle weakness (generalized): Secondary | ICD-10-CM | POA: Diagnosis not present

## 2023-06-14 DIAGNOSIS — K8033 Calculus of bile duct with acute cholangitis with obstruction: Secondary | ICD-10-CM | POA: Diagnosis not present

## 2023-06-14 DIAGNOSIS — G7100 Muscular dystrophy, unspecified: Secondary | ICD-10-CM | POA: Diagnosis not present

## 2023-06-14 DIAGNOSIS — K580 Irritable bowel syndrome with diarrhea: Secondary | ICD-10-CM | POA: Diagnosis not present

## 2023-06-14 DIAGNOSIS — R601 Generalized edema: Secondary | ICD-10-CM | POA: Diagnosis not present

## 2023-06-14 DIAGNOSIS — R945 Abnormal results of liver function studies: Secondary | ICD-10-CM | POA: Diagnosis not present

## 2023-06-14 DIAGNOSIS — R279 Unspecified lack of coordination: Secondary | ICD-10-CM | POA: Diagnosis not present

## 2023-06-14 LAB — COMPREHENSIVE METABOLIC PANEL
ALT: 88 U/L — ABNORMAL HIGH (ref 0–44)
AST: 38 U/L (ref 15–41)
Albumin: 2.6 g/dL — ABNORMAL LOW (ref 3.5–5.0)
Alkaline Phosphatase: 122 U/L (ref 38–126)
Anion gap: 9 (ref 5–15)
BUN: 9 mg/dL (ref 8–23)
CO2: 24 mmol/L (ref 22–32)
Calcium: 8.6 mg/dL — ABNORMAL LOW (ref 8.9–10.3)
Chloride: 105 mmol/L (ref 98–111)
Creatinine, Ser: 0.75 mg/dL (ref 0.44–1.00)
GFR, Estimated: >60 mL/min (ref >60)
Glucose, Bld: 166 mg/dL — ABNORMAL HIGH (ref 70–99)
Potassium: 3.2 mmol/L — ABNORMAL LOW (ref 3.5–5.1)
Sodium: 138 mmol/L (ref 135–145)
Total Bilirubin: 1.1 mg/dL (ref 0.3–1.2)
Total Protein: 5.6 g/dL — ABNORMAL LOW (ref 6.5–8.1)

## 2023-06-14 MED ORDER — POTASSIUM CHLORIDE CRYS ER 20 MEQ PO TBCR
40.0000 meq | EXTENDED_RELEASE_TABLET | Freq: Once | ORAL | Status: AC
  Administered 2023-06-14: 40 meq via ORAL
  Filled 2023-06-14: qty 2

## 2023-06-14 MED ORDER — AMOXICILLIN-POT CLAVULANATE 875-125 MG PO TABS
1.0000 | ORAL_TABLET | Freq: Two times a day (BID) | ORAL | Status: AC
Start: 2023-06-14 — End: 2023-06-16

## 2023-06-14 MED ORDER — POTASSIUM CHLORIDE CRYS ER 20 MEQ PO TBCR: 40.0000 meq | EXTENDED_RELEASE_TABLET | Freq: Every day | ORAL | Status: AC

## 2023-06-14 MED ORDER — FUROSEMIDE 20 MG PO TABS: 20.0000 mg | ORAL_TABLET | Freq: Two times a day (BID) | ORAL | Status: AC

## 2023-06-14 MED ORDER — LOPERAMIDE HCL 2 MG PO CAPS: 2.0000 mg | ORAL_CAPSULE | Freq: Four times a day (QID) | ORAL | Status: AC | PRN

## 2023-06-14 MED ORDER — ONDANSETRON HCL 4 MG PO TABS: 4.0000 mg | ORAL_TABLET | Freq: Four times a day (QID) | ORAL | Status: AC | PRN

## 2023-06-14 MED ORDER — HYOSCYAMINE SULFATE 0.125 MG SL SUBL: 0.1250 mg | SUBLINGUAL_TABLET | Freq: Four times a day (QID) | SUBLINGUAL | Status: DC | PRN

## 2023-06-14 MED ORDER — ALBUTEROL SULFATE (2.5 MG/3ML) 0.083% IN NEBU: 2.5000 mg | INHALATION_SOLUTION | RESPIRATORY_TRACT | Status: AC | PRN

## 2023-06-14 NOTE — TOC Progression Note (Addendum)
Transition of Care [facility]) - Progression Note    Patient Details  Name: Catherine Harding MRN: [id number] Date of Birth: 1930/01/27  [facility] CM/SW Contact  Carley Hammed, LCSW Phone Number: 06/14/2023, 11:02 AM  Clinical Narrative:    CSW reviewed chart and noted that pt was denied and went to [facility], then later [facility] requested updated clinicals. CSW called [facility] to enquire on the current status of pt's auth, CSW was advised that a nurse would have to reach out. Facility updated and continue to follow for DC. TOC will continue to follow.   3:00 CSW advised that auth had been approved for SNF, but denied for ambulance. Son appealing. CSW working on wheelchair transport.   Expected Discharge Plan: Skilled Nursing Facility Barriers to Discharge: Insurance Authorization  Expected Discharge Plan and Services In-house Referral: Clinical Social Work   Post Acute Care Choice: Skilled Nursing Facility Living arrangements for the past 2 months: Single Family Home                   DME Agency: NA                   Social Determinants of Health (SDOH) Interventions SDOH Screenings   Food Insecurity: No Food Insecurity (06/09/2023)  Housing: Low Risk  (06/09/2023)  Transportation Needs: No Transportation Needs (06/09/2023)  Utilities: Not At Risk (06/09/2023)  Depression (PHQ2-9): Low Risk  (08/04/2019)  Tobacco Use: Low Risk  (06/08/2023)    Readmission Risk Interventions     No data to display

## 2023-06-14 NOTE — Progress Notes (Signed)
Attempted to call report to nurse at [facility] x3 was unsuccessful.

## 2023-06-14 NOTE — Progress Notes (Signed)
Occupational Therapy Treatment Patient Details Name: Catherine Harding MRN: [id number] DOB: 1929/12/26 Today's Date: 06/14/2023   History of present illness Patient is a 89 y/o female admitted 06/08/23 with R UQ pain and vomiting found to have cholecystitis on CT though no signs on MRCP.  PMH positive for muscular dystrophy, pseudocholinesterase deficiency, HTN, GERD, OA.   OT comments  Pt c/o fatigue, but motivated to participate and improve activity tolerance and strength. Pt son present during session. Pt requires increased effort for all activities, labored breathing noted, frequent rest breaks. Pt able to ambulate 10 feet CGA before requiring to sit down promptly. Pt able to ambulate 10 feet back to recliner CGA. Pt completed 10X STS's with rest breaks in between, mod-max effort for each one. Pt DC to postacute <3hrs/day still appropriate, will continue to follow to improve strength/endurance as able.       If plan is discharge home, recommend the following:  A little help with walking and/or transfers;A little help with bathing/dressing/bathroom;Assistance with cooking/housework;Assist for transportation;Help with stairs or ramp for entrance   Equipment Recommendations  Other (comment) (defer)    Recommendations for Other Services      Precautions / Restrictions Precautions Precautions: Fall Restrictions Weight Bearing Restrictions: No       Mobility Bed Mobility Overal bed mobility: Needs Assistance             General bed mobility comments: arrived in recliner    Transfers Overall transfer level: Needs assistance Equipment used: Rolling walker (2 wheels) Transfers: Sit to/from Stand, Bed to chair/wheelchair/BSC Sit to Stand: Contact guard assist     Step pivot transfers: Contact guard assist     General transfer comment: increased effort/time     Balance Overall balance assessment: Needs assistance Sitting-balance support: No upper extremity supported, Feet  supported Sitting balance-Leahy Scale: Good     Standing balance support: Single extremity supported, Bilateral upper extremity supported, During functional activity Standing balance-Leahy Scale: Poor Standing balance comment: UE support and CGA                           ADL either performed or assessed with clinical judgement   ADL Overall ADL's : Needs assistance/impaired Eating/Feeding: Set up;Sitting   Grooming: Set up;Sitting                   Toilet Transfer: Contact guard assist;Minimal assistance;Cueing for safety;BSC/3in1;Rolling walker (2 wheels)           Functional mobility during ADLs: Contact guard assist;Rolling walker (2 wheels) General ADL Comments: Pt displays decreased tolerance for activities, requires frequent rest breaks, increased time    Extremity/Trunk Assessment Upper Extremity Assessment Upper Extremity Assessment: Generalized weakness            Vision       Perception     Praxis      Cognition Arousal: Alert Behavior During Therapy: WFL for tasks assessed/performed Overall Cognitive Status: Within Functional Limits for tasks assessed                                          Exercises Other Exercises Other Exercises: sit to stands X10, significant increased time/effort and rest breaks, labored breathing    Shoulder Instructions       General Comments      Pertinent Vitals/ Pain  Pain Assessment Pain Assessment: 0-10 Pain Score: 2   Home Living                                          Prior Functioning/Environment              Frequency  Min 1X/week        Progress Toward Goals  OT Goals(current goals can now be found in the care plan section)  Progress towards OT goals: Progressing toward goals  Acute Rehab OT Goals Patient Stated Goal: to improve strength/endurance OT Goal Formulation: With patient/family Time For Goal Achievement:  06/26/23 Potential to Achieve Goals: Good ADL Goals Pt Will Perform Lower Body Bathing: with supervision;sitting/lateral leans;sit to/from stand Pt Will Perform Lower Body Dressing: with supervision;sitting/lateral leans;sit to/from stand Pt Will Transfer to Toilet: with supervision;ambulating;regular height toilet Pt Will Perform Toileting - Clothing Manipulation and hygiene: with supervision;sitting/lateral leans;sit to/from stand Pt/caregiver will Perform Home Exercise Program: Increased strength;Both right and left upper extremity;With theraband;With theraputty;With Supervision;With written HEP provided  Plan      Co-evaluation                 AM-PAC OT "6 Clicks" Daily Activity     Outcome Measure   Help from another person eating meals?: A Little Help from another person taking care of personal grooming?: A Little Help from another person toileting, which includes using toliet, bedpan, or urinal?: A Little Help from another person bathing (including washing, rinsing, drying)?: A Little Help from another person to put on and taking off regular upper body clothing?: A Little Help from another person to put on and taking off regular lower body clothing?: A Little 6 Click Score: 18    End of Session Equipment Utilized During Treatment: Gait belt;Rolling walker (2 wheels)  OT Visit Diagnosis: Unsteadiness on feet (R26.81);Muscle weakness (generalized) (M62.81);Other (comment) (decreased activity tolerance)   Activity Tolerance Patient tolerated treatment well   Patient Left in chair;with call bell/phone within reach;with family/visitor present   Nurse Communication Mobility status        Time: 1610-9604 OT Time Calculation (min): 26 min  Charges: OT General Charges $OT Visit: 1 Visit OT Treatments $Therapeutic Activity: 8-22 mins $Therapeutic Exercise: 8-22 mins  Roxine Whittinghill, OTR/L   Kyrra Prada R Adriena Manfre 06/14/2023, 12:01 PM

## 2023-06-14 NOTE — Progress Notes (Addendum)
[facility] Progress Note     Subjective: Mild diffuse abdominal soreness, secondary to her "IBS."  Slightly more on right than left.  Tolerating solid diet with no increase in pain.    Objective: Vital signs in last 24 hours: Temp:  [97.8 F (36.6 C)-98.2 F (36.8 C)] 97.8 F (36.6 C) (09/03 0538) Pulse Rate:  [53-96] 53 (09/03 0538) Resp:  [17-18] 18 (09/03 0538) BP: (129-166)/(65-135) 130/74 (09/03 0538) SpO2:  [94 %-100 %] 94 % (09/03 0538) Weight:  [68.8 kg] 68.8 kg (09/03 0538) Last BM Date : 06/11/23  Intake/Output from previous day: 09/02 0701 - 09/03 0700 In: 365.1 [P.O.:150; IV Piggyback:215.1] Out: 600 [Urine:600] Intake/Output this shift: No intake/output data recorded.  PE: Gen:  Alert, NAD, pleasant Abd: soft, ND, generalized TTP without rebound or guarding, slightly more on right than left  Lab Results:  Recent Labs    06/12/23 0316 06/13/23 0309  WBC 5.6 6.0  HGB 11.2* 10.8*  HCT 33.5* 33.0*  PLT 178 181   BMET Recent Labs    06/13/23 0309 06/14/23 0615  NA 135 138  K 3.5 3.2*  CL 110 105  CO2 22 24  GLUCOSE 133* 166*  BUN 7* 9  CREATININE 0.77 0.75  CALCIUM 8.2* 8.6*   PT/INR No results for input(s): "LABPROT", "INR" in the last 72 hours. CMP     Component Value Date/Time   NA 138 06/14/2023 0615   NA 139 03/16/2023 1340   K 3.2 (L) 06/14/2023 0615   CL 105 06/14/2023 0615   CO2 24 06/14/2023 0615   GLUCOSE 166 (H) 06/14/2023 0615   BUN 9 06/14/2023 0615   BUN 16 03/16/2023 1340   CREATININE 0.75 06/14/2023 0615   CREATININE 0.76 08/28/2012 1249   CALCIUM 8.6 (L) 06/14/2023 0615   CALCIUM 10.1 08/28/2012 1249   PROT 5.6 (L) 06/14/2023 0615   PROT 6.6 03/16/2023 1340   ALBUMIN 2.6 (L) 06/14/2023 0615   ALBUMIN 4.1 03/16/2023 1340   AST 38 06/14/2023 0615   ALT 88 (H) 06/14/2023 0615   ALKPHOS 122 06/14/2023 0615   BILITOT 1.1 06/14/2023 0615   BILITOT 0.6 03/16/2023 1340   GFRNONAA >60 06/14/2023 0615    GFRAA >90 07/27/2012 1100   Lipase     Component Value Date/Time   LIPASE 19 06/08/2023 1922       Studies/Results: No results found.  Anti-infectives: Anti-infectives (From admission, onward)    Start     Dose/Rate Route Frequency Ordered Stop   06/09/23 1200  piperacillin-tazobactam (ZOSYN) IVPB 3.375 g  Status:  Discontinued        3.375 g 100 mL/hr over 30 Minutes Intravenous Every 6 hours 06/09/23 0846 06/09/23 0857   06/09/23 1000  piperacillin-tazobactam (ZOSYN) IVPB 3.375 g        3.375 g 12.5 mL/hr over 240 Minutes Intravenous Every 8 hours 06/09/23 0857     06/08/23 2300  piperacillin-tazobactam (ZOSYN) IVPB 3.375 g        3.375 g 100 mL/hr over 30 Minutes Intravenous  Once 06/08/23 2248 06/09/23 0009        Assessment/Plan Acute cholecystitis Elevated LFTs - MRCP showed a common bile duct of 13mm centrally with mild tapering at the ampulla, no definite choledocholithiasis seen but concern for nonvisualized distal CBD stone versus stricture; concern for cholecystitis on CT and u/s but no signs of -itis on MRCP - Appreciate GI consult. LFTs are trending down and pain is back to her baseline  abdominal pain. Continue conservative measures.  -tolerating low fat diet -recommend 7 days total of abx therapy  -patient would like to avoid surgery if able, which it appears she has done thus far.  We did discuss that with no surgery, this could recur.  She understands and hopes not, but still does not wish to pursue surgery at this time. -no further surgical needs at this time.  She is surgically stable for DC. -we will sign off, discussed with primary service.   ID - zosyn VTE - SCDs, sq heparin FEN - IVF, HH diet Foley - none   Muscular dystrophy - nearly wheelchair bound since May 2024 Pseudocholinesterase deficiency HTN PACs IBS  I reviewed hospitalist notes, last 24 h vitals and pain scores, last 48 h intake and output, and last 24 h labs and trends.     LOS: 5 days    Catherine Harding, Catherine [facility] 06/14/2023, 8:32 AM Please see Amion for pager number during day hours 7:00am-4:30pm

## 2023-06-14 NOTE — TOC Transition Note (Addendum)
Transition of Care [facility]) - CM/SW Discharge Note   Patient Details  Name: Catherine Harding MRN: [id number] Date of Birth: 08/27/1930  [facility] CM/SW Contact:  Carley Hammed, LCSW Phone Number: 06/14/2023, 12:41 PM   Clinical Narrative:    Pt to be transported to [facility] via [facility]. Nurse to call report to [phone].  [facility] approved SNF, Denied ambulance.    Final next level of care: Skilled Nursing Facility Barriers to Discharge: Barriers Resolved   Patient Goals and CMS Choice CMS Medicare.gov Compare Post Acute Care list provided to:: Patient Choice offered to / list presented to : Patient  Discharge Placement                Patient chooses bed at: [facility] Patient to be transferred to facility by: Son Name of family member notified: Son Patient and family notified of of transfer: 06/14/23  Discharge Plan and Services Additional resources added to the After Visit Summary for   In-house Referral: Clinical Social Work   Post Acute Care Choice: Skilled Nursing Facility            DME Agency: NA                  Social Determinants of Health (SDOH) Interventions SDOH Screenings   Food Insecurity: No Food Insecurity (06/09/2023)  Housing: Low Risk  (06/09/2023)  Transportation Needs: No Transportation Needs (06/09/2023)  Utilities: Not At Risk (06/09/2023)  Depression (PHQ2-9): Low Risk  (08/04/2019)  Tobacco Use: Low Risk  (06/08/2023)     Readmission Risk Interventions     No data to display

## 2023-06-14 NOTE — Discharge Summary (Signed)
Physician Discharge Summary  Catherine Harding QMV:[id number] DOB: [birth year] DOA: 06/08/2023  PCP: Adrian Prince, MD  Admit date: 06/08/2023 Discharge date: 06/14/2023  Admitted From: Home  Discharge disposition: Skilled nursing facility   Recommendations for Outpatient Follow-Up:   Follow up with your primary care provider in one week.  Check CBC, BMP, magnesium in the next visit Patient advised to follow-up with general surgery as outpatient as needed for gallbladder issues if recurrent.  Discharge Diagnosis:   Principal Problem:   Transaminitis and right upper quadrant pain Active Problems:   Pseudocholinesterase deficiency   Benign hypertension   Discharge Condition: Improved.  Diet recommendation: Low sodium, heart healthy.   Wound care: None.  Code status: Full.   History of Present Illness:   Catherine Harding is a 89 y.o. female with muscular dystrophy, pseudocholinesterase deficiency who presented with RUQ pain, nausea and vomiting.  She initially presented to [facility] ED and was diagnosed to be having acute cholecystitis.  Initially she was slightly hypotensive.  WBC at 10.0.  Lipase was 19.  AST and ALT were elevated at 253, 136 respectively.  Lactate was elevated at 4.7.  EKG showed sinus tachycardia.  Ultrasound of the right upper quadrant showed cholelithiasis with equivocal findings for acute cholecystitis.  CT scan of the abdomen pelvis with diverticulosis without diverticulitis.  Distended gallbladder with evidence of cholelithiasis.  Patient was given normal saline, Zosyn and she was then admitted to the hospital for further evaluation and treatment.   Hospital Course:   Following conditions were addressed during hospitalization as listed below,  *Suspected choledocholithiasis with ascending cholangitis  Acute cholecystitis. ERCP was deferred only due to the risk involved given her age and pseudocholinesterase deficiency.  MRCP showed common bile duct  of 13 mm with mild tapering at the ampulla but no definite choledocholithiasis.  There was some concern for distal CBD stone versus stricture.  GI was consulted and currently LFTs are trending down and she is at baseline abdominal pain.  Tolerating low-fat diet.  Empirically on antibiotic and plan for 7-day total antibiotic therapy.  Last day of antibiotic 06/15/2023.  Patient would like to avoid surgery if possible.  General surgery has seen the patient today and patient is stable from surgical side for disposition.  Patient is afebrile.  No leukocytosis.  Awaiting oral diet..  Benign hypertension On Cardizem and Lasix from home.  Latest blood pressure of 130/74   Pseudocholinesterase deficiency Increased risk of anesthesia so trying to avoid any intervention or surgery.  IBS with chronic abdominal pain. Abdominal pain likely at baseline at this time.  Continue home Levsin  Deconditioning, debility.  PT OT has recommended skilled nursing facility placement at this time.   Disposition.  At this time, patient is stable for disposition to skilled nursing facility with outpatient as needed surgical follow-up.  Medical Consultants:   GI General surgery  Procedures:    MRCP Subjective:   Today, patient was seen and examined at bedside.  Patient's son at bedside.  Denies any nausea, vomiting, fever or chills.  Mild diffuse soreness secondary to IBS.  Was able to tolerate solid diet without any increased pain.  Discharge Exam:   Vitals:   06/14/23 0756 06/14/23 1017  BP: (!) 159/74 (!) 157/54  Pulse: (!) 51   Resp: 18   Temp: 98.1 F (36.7 C)   SpO2: 95%    Vitals:   06/14/23 0023 06/14/23 0538 06/14/23 0756 06/14/23 1017  BP: (!) 142/65 130/74 (!) 159/74 Marland Kitchen)  157/54  Pulse: 96 (!) 53 (!) 51   Resp: 18 18 18    Temp: 98 F (36.7 C) 97.8 F (36.6 C) 98.1 F (36.7 C)   TempSrc: Oral Oral Oral   SpO2: 97% 94% 95%   Weight:  68.8 kg    Height:       Body mass index is 28.66  kg/m.   General: Alert awake, not in obvious distress, elderly female, Communicative HENT: pupils equally reacting to light,  No scleral pallor or icterus noted. Oral mucosa is moist.  Chest:    Diminished breath sounds bilaterally. No crackles or wheezes.  CVS: S1 &S2 heard. No murmur.  Regular rate and rhythm. Abdomen: Soft, generalized nonspecific tenderness on palpation, nondistended.  Bowel sounds are heard.   Extremities: No cyanosis, clubbing, trace edema peripheral pulses are palpable. Psych: Alert, awake and Communicative, CNS:  No cranial nerve deficits.  Power equal in all extremities.   Skin: Warm and dry.  No rashes noted.  The results of significant diagnostics from this hospitalization (including imaging, microbiology, ancillary and laboratory) are listed below for reference.     Diagnostic Studies:   MR ABDOMEN MRCP W WO CONTAST  Result Date: 06/10/2023 CLINICAL DATA:  Abdominal pain, nausea, diarrhea EXAM: MRI ABDOMEN WITHOUT AND WITH CONTRAST (INCLUDING MRCP) TECHNIQUE: Multiplanar multisequence MR imaging of the abdomen was performed both before and after the administration of intravenous contrast. Heavily T2-weighted images of the biliary and pancreatic ducts were obtained, and three-dimensional MRCP images were rendered by post processing. CONTRAST:  7mL GADAVIST GADOBUTROL 1 MMOL/ML IV SOLN COMPARISON:  CTA abdomen/pelvis dated 06/08/2023. CT abdomen/pelvis dated 05/13/2017. FINDINGS: Lower chest: Trace bilateral pleural effusions. Hepatobiliary: Mild hepatic steatosis. No suspicious/enhancing hepatic lesions. Gallbladder is notable for layering small gallstones (series 3/image 26), without associated inflammatory changes. Mild central intrahepatic ductal prominence. Common duct measures 13 mm centrally and mildly tapers at the ampulla. No choledocholithiasis is seen. Pancreas: Segmental ductal dilatation with side branch ectasia in the distal pancreatic tail (series 3/image  17). In retrospect, this appearance chronic dating back to 2018. No associated obstructing pancreatic mass is evident on MR. Spleen:  Within normal limits. Adrenals/Urinary Tract:  Adrenal glands are within normal limits. Two left renal cysts measuring up to 5 mm, benign. Right kidney is within normal limits. No hydronephrosis. Stomach/Bowel: Stomach is within normal limits. Visualized bowel is notable for scattered colonic diverticulosis, without evidence of diverticulitis. Vascular/Lymphatic:  No evidence of abdominal aortic aneurysm. No suspicious abdominal lymphadenopathy. Other:  No abdominal ascites. Musculoskeletal: Mild degenerative changes of the lumbar spine. IMPRESSION: Cholelithiasis, without associated inflammatory changes. Mild intrahepatic and extrahepatic ductal dilatation. Common duct measures 13 mm centrally and mildly tapers at the ampulla. No choledocholithiasis is seen. However, in the setting of abnormal LFTs, a nonvisualized distal CBD stone versus stricture is likely. Consider ERCP as clinically warranted. Segmental ductal dilatation with side branch ectasia in the distal pancreatic tail, chronic dating back to 2018. No associated obstructing pancreatic mass is evident on MR. Follow-up MRI abdomen with/without contrast could be performed in 1 year if clinically warranted given the patient's age. Mild hepatic steatosis. Trace bilateral pleural effusions. Electronically Signed   By: Charline Bills M.D.   On: 06/10/2023 00:32   MR 3D Recon At Scanner  Result Date: 06/10/2023 CLINICAL DATA:  Abdominal pain, nausea, diarrhea EXAM: MRI ABDOMEN WITHOUT AND WITH CONTRAST (INCLUDING MRCP) TECHNIQUE: Multiplanar multisequence MR imaging of the abdomen was performed both before and after the administration of  intravenous contrast. Heavily T2-weighted images of the biliary and pancreatic ducts were obtained, and three-dimensional MRCP images were rendered by post processing. CONTRAST:  7mL  GADAVIST GADOBUTROL 1 MMOL/ML IV SOLN COMPARISON:  CTA abdomen/pelvis dated 06/08/2023. CT abdomen/pelvis dated 05/13/2017. FINDINGS: Lower chest: Trace bilateral pleural effusions. Hepatobiliary: Mild hepatic steatosis. No suspicious/enhancing hepatic lesions. Gallbladder is notable for layering small gallstones (series 3/image 26), without associated inflammatory changes. Mild central intrahepatic ductal prominence. Common duct measures 13 mm centrally and mildly tapers at the ampulla. No choledocholithiasis is seen. Pancreas: Segmental ductal dilatation with side branch ectasia in the distal pancreatic tail (series 3/image 17). In retrospect, this appearance chronic dating back to 2018. No associated obstructing pancreatic mass is evident on MR. Spleen:  Within normal limits. Adrenals/Urinary Tract:  Adrenal glands are within normal limits. Two left renal cysts measuring up to 5 mm, benign. Right kidney is within normal limits. No hydronephrosis. Stomach/Bowel: Stomach is within normal limits. Visualized bowel is notable for scattered colonic diverticulosis, without evidence of diverticulitis. Vascular/Lymphatic:  No evidence of abdominal aortic aneurysm. No suspicious abdominal lymphadenopathy. Other:  No abdominal ascites. Musculoskeletal: Mild degenerative changes of the lumbar spine. IMPRESSION: Cholelithiasis, without associated inflammatory changes. Mild intrahepatic and extrahepatic ductal dilatation. Common duct measures 13 mm centrally and mildly tapers at the ampulla. No choledocholithiasis is seen. However, in the setting of abnormal LFTs, a nonvisualized distal CBD stone versus stricture is likely. Consider ERCP as clinically warranted. Segmental ductal dilatation with side branch ectasia in the distal pancreatic tail, chronic dating back to 2018. No associated obstructing pancreatic mass is evident on MR. Follow-up MRI abdomen with/without contrast could be performed in 1 year if clinically warranted  given the patient's age. Mild hepatic steatosis. Trace bilateral pleural effusions. Electronically Signed   By: Charline Bills M.D.   On: 06/10/2023 00:32   ECHOCARDIOGRAM COMPLETE  Result Date: 06/09/2023    ECHOCARDIOGRAM REPORT   Patient Name:   IYANNAH GUMINA [facility] Date of Exam: 06/09/2023 Medical Rec #:  [id number]         Height:       61.0 in Accession #:    [id number]        Weight:       150.0 lb Date of Birth:  08/05/1930        BSA:          1.671 m Patient Age:    89 years          BP:           144/62 mmHg Patient Gender: F                 HR:           77 bpm. Exam Location:  Inpatient Procedure: 2D Echo, Color Doppler and Cardiac Doppler Indications:    Pre-op  History:        Patient has prior history of Echocardiogram examinations, most                 recent 02/10/2016. Risk Factors:Hypertension.  Sonographer:    Delcie Roch RDCS Referring Phys: [id number] SARA-MAIZ A THOMAS IMPRESSIONS  1. Left ventricular ejection fraction, by estimation, is 60 to 65%. The left ventricle has normal function. The left ventricle has no regional wall motion abnormalities. There is mild left ventricular hypertrophy. Left ventricular diastolic parameters are indeterminate.  2. Right ventricular systolic function is normal. The right ventricular size is normal. There is normal pulmonary artery systolic pressure.  3. Left atrial size was mildly dilated.  4. The mitral valve is degenerative. No evidence of mitral valve regurgitation. No evidence of mitral stenosis.  5. The aortic valve is tricuspid. Aortic valve regurgitation is not visualized. Aortic valve sclerosis is present, with no evidence of aortic valve stenosis.  6. The inferior vena cava is dilated in size with >50% respiratory variability, suggesting right atrial pressure of 8 mmHg.  7. Rhythm strip during this exam demonstrates normal sinus rhythm. Comparison(s): Prior outside echo 03/19/2022 reported: LVEF 59%, Grade 1 diastolic dysfunction, mild to  moderate TR, trace AS/MS. FINDINGS  Left Ventricle: Left ventricular ejection fraction, by estimation, is 60 to 65%. The left ventricle has normal function. The left ventricle has no regional wall motion abnormalities. The left ventricular internal cavity size was normal in size. There is  mild left ventricular hypertrophy. Left ventricular diastolic parameters are indeterminate. Right Ventricle: The right ventricular size is normal. No increase in right ventricular wall thickness. Right ventricular systolic function is normal. There is normal pulmonary artery systolic pressure. The tricuspid regurgitant velocity is 2.58 m/s, and  with an assumed right atrial pressure of 8 mmHg, the estimated right ventricular systolic pressure is 34.6 mmHg. Left Atrium: Left atrial size was mildly dilated. Right Atrium: Right atrial size was normal in size. Pericardium: There is no evidence of pericardial effusion. Mitral Valve: The mitral valve is degenerative in appearance. Mild mitral annular calcification. No evidence of mitral valve regurgitation. No evidence of mitral valve stenosis. Tricuspid Valve: The tricuspid valve is normal in structure. Tricuspid valve regurgitation is mild . No evidence of tricuspid stenosis. Aortic Valve: The aortic valve is tricuspid. Aortic valve regurgitation is not visualized. Aortic valve sclerosis is present, with no evidence of aortic valve stenosis. Aortic valve mean gradient measures 10.0 mmHg. Aortic valve peak gradient measures 17.5 mmHg. Aortic valve area, by VTI measures 1.99 cm. Pulmonic Valve: The pulmonic valve was normal in structure. Pulmonic valve regurgitation is trivial. No evidence of pulmonic stenosis. Aorta: The aortic root and ascending aorta are structurally normal, with no evidence of dilitation. Venous: The inferior vena cava is dilated in size with greater than 50% respiratory variability, suggesting right atrial pressure of 8 mmHg. IAS/Shunts: No atrial level shunt  detected by color flow Doppler. EKG: Rhythm strip during this exam demonstrates normal sinus rhythm.  LEFT VENTRICLE PLAX 2D LVIDd:         4.10 cm   Diastology LVIDs:         2.40 cm   LV e' medial:    7.83 cm/s LV PW:         1.10 cm   LV E/e' medial:  14.3 LV IVS:        1.20 cm   LV e' lateral:   9.46 cm/s LVOT diam:     2.00 cm   LV E/e' lateral: 11.8 LV SV:         84 LV SV Index:   50 LVOT Area:     3.14 cm  RIGHT VENTRICLE             IVC RV Basal diam:  2.40 cm     IVC diam: 2.10 cm RV S prime:     18.30 cm/s TAPSE (M-mode): 2.5 cm LEFT ATRIUM             Index        RIGHT ATRIUM           Index LA diam:  3.70 cm 2.21 cm/m   RA Area:     11.80 cm LA Vol (A2C):   62.8 ml 37.57 ml/m  RA Volume:   24.90 ml  14.90 ml/m LA Vol (A4C):   58.2 ml 34.82 ml/m LA Biplane Vol: 65.0 ml 38.89 ml/m  AORTIC VALVE AV Area (Vmax):    1.89 cm AV Area (Vmean):   1.83 cm AV Area (VTI):     1.99 cm AV Vmax:           209.00 cm/s AV Vmean:          144.000 cm/s AV VTI:            0.419 m AV Peak Grad:      17.5 mmHg AV Mean Grad:      10.0 mmHg LVOT Vmax:         125.50 cm/s LVOT Vmean:        83.900 cm/s LVOT VTI:          0.266 m LVOT/AV VTI ratio: 0.63  AORTA Ao Root diam: 3.00 cm Ao Asc diam:  3.30 cm MITRAL VALVE                TRICUSPID VALVE MV Area (PHT): 3.03 cm     TR Peak grad:   26.6 mmHg MV Decel Time: 250 msec     TR Vmax:        258.00 cm/s MV E velocity: 112.00 cm/s MV A velocity: 137.00 cm/s  SHUNTS MV E/A ratio:  0.82         Systemic VTI:  0.27 m                             Systemic Diam: 2.00 cm Sunit Tolia DO Electronically signed by Tessa Lerner DO Signature Date/Time: 06/09/2023/6:03:04 PM    Final    DG CHEST PORT 1 VIEW  Result Date: 06/09/2023 CLINICAL DATA:  Hypoxemia. EXAM: PORTABLE CHEST 1 VIEW COMPARISON:  02/14/2008 FINDINGS: Low volume film. Cardiopericardial silhouette is at upper limits of normal for size. Interstitial markings are diffusely coarsened with chronic features.  Bones are diffusely demineralized. Telemetry leads overlie the chest. IMPRESSION: Low volume film without acute cardiopulmonary findings. Electronically Signed   By: Kennith Center M.D.   On: 06/09/2023 13:36   US Abdomen Limited RUQ (LIVER/GB)  Result Date: 06/08/2023 CLINICAL DATA:  Epigastric pain, nausea vomiting. EXAM: ULTRASOUND ABDOMEN LIMITED RIGHT UPPER QUADRANT COMPARISON:  CT dated 06/08/2023. FINDINGS: Gallbladder: There multiple stones within the gallbladder. The gallbladder wall is slightly thickened measuring 5 mm. There is a small pericholecystic fluid. Negative sonographic Murphy's sign. Common bile duct: Diameter: 13 mm. Liver: There is diffuse increased liver echogenicity most commonly seen in the setting of fatty infiltration. Superimposed inflammation or fibrosis is not excluded. Clinical correlation is recommended. Portal vein is patent on color Doppler imaging with normal direction of blood flow towards the liver. Other: None. IMPRESSION: 1. Cholelithiasis with equivocal findings for acute cholecystitis. A hepatobiliary scintigraphy may provide better evaluation of the gallbladder if there is a high clinical concern for acute cholecystitis . 2. Fatty liver. Electronically Signed   By: Elgie Collard M.D.   On: 06/08/2023 22:23   CT Angio Chest/Abd/Pel for Dissection W and/or Wo Contrast  Result Date: 06/08/2023 CLINICAL DATA:  Chest and abdominal pain with hypertension, initial encounter EXAM: CT ANGIOGRAPHY CHEST, ABDOMEN AND PELVIS TECHNIQUE: Non-contrast CT of the chest was initially  obtained. Multidetector CT imaging through the chest, abdomen and pelvis was performed using the standard protocol during bolus administration of intravenous contrast. Multiplanar reconstructed images and MIPs were obtained and reviewed to evaluate the vascular anatomy. RADIATION DOSE REDUCTION: This exam was performed according to the departmental dose-optimization program which includes automated  exposure control, adjustment of the mA and/or kV according to patient size and/or use of iterative reconstruction technique. CONTRAST:  OMNIPAQUE IOHEXOL 350 MG/ML SOLN COMPARISON:  03/23/2023 FINDINGS: CTA CHEST FINDINGS Cardiovascular: Initial precontrast images show atherosclerotic calcification of the thoracic aorta. No hyperdense crescent to suggest acute aortic injury is seen. No aneurysmal dilatation of the aorta is noted on postcontrast images. No dissection is seen. Mild soft atherosclerotic plaque is noted in the distal descending thoracic aorta. The pulmonary artery shows a normal branching pattern bilaterally. No intraluminal filling defect to suggest pulmonary embolism is seen. Coronary calcifications are noted. Mediastinum/Nodes: Thoracic inlet is within normal limits. No hilar or mediastinal adenopathy is noted. The esophagus as visualized is within normal limits. Lungs/Pleura: Lungs are well aerated bilaterally. No focal infiltrate or sizable effusion is seen. Musculoskeletal: Degenerative changes of the thoracic spine are noted. No acute rib abnormality is noted. Old rib fractures are seen bilaterally with healing. Review of the MIP images confirms the above findings. CTA ABDOMEN AND PELVIS FINDINGS VASCULAR Aorta: Atherosclerotic calcifications are noted. No aneurysmal dilatation is seen. Celiac: Patent without evidence of aneurysm, dissection, vasculitis or significant stenosis. SMA: Patent without evidence of aneurysm, dissection, vasculitis or significant stenosis. Renals: Both renal arteries are patent without evidence of aneurysm, dissection, vasculitis, fibromuscular dysplasia or significant stenosis. IMA: Patent without evidence of aneurysm, dissection, vasculitis or significant stenosis. Inflow: Iliacs demonstrate atherosclerotic calcification without stenosis or aneurysmal dilatation. Veins: No specific venous abnormality is noted. Review of the MIP images confirms the above  findings. NON-VASCULAR Hepatobiliary: Liver is within normal limits. Gallbladder is significantly distended with dependent density consistent with cholelithiasis. No biliary ductal dilatation is seen. Pancreas: Unremarkable. No pancreatic ductal dilatation or surrounding inflammatory changes. Spleen: Normal in size without focal abnormality. Adrenals/Urinary Tract: Adrenal glands are within normal limits. Kidneys demonstrate a normal enhancement pattern bilaterally. No renal calculi or obstructive changes are seen. The bladder is decompressed. Stomach/Bowel: Scattered diverticular change of the colon is noted without evidence of diverticulitis. The appendix is not well visualized. No inflammatory changes to suggest appendicitis are noted. Small bowel and stomach are within normal limits. Lymphatic: No lymphadenopathy is seen. Reproductive: Status post hysterectomy. No adnexal masses. Other: No abdominal wall hernia or abnormality. No abdominopelvic ascites. Musculoskeletal: Degenerative changes of lumbar spine are noted. No acute abnormality noted. Review of the MIP images confirms the above findings. IMPRESSION: CTA of the chest: No evidence of aortic dissection. No evidence of pulmonary emboli is noted. CTA of the abdomen and pelvis: No arterial abnormality is noted. Diverticulosis without diverticulitis. Well distended gallbladder with evidence of cholelithiasis. Electronically Signed   By: Alcide Clever M.D.   On: 06/08/2023 21:55     Labs:   Basic Metabolic Panel: Recent Labs  Lab 06/10/23 0734 06/11/23 0318 06/12/23 0316 06/13/23 0309 06/14/23 0615  NA 136 133* 136 135 138  K 3.0* 3.5 3.0* 3.5 3.2*  CL 107 105 105 110 105  CO2 20* 19* 21* 22 24  GLUCOSE 131* 131* 190* 133* 166*  BUN 7* 7* 5* 7* 9  CREATININE 0.77 0.80 0.71 0.77 0.75  CALCIUM 8.3* 8.3* 8.6* 8.2* 8.6*   GFR Estimated Creatinine  Clearance: 41.5 mL/min (by C-G formula based on SCr of 0.75 mg/dL). Liver Function  Tests: Recent Labs  Lab 06/10/23 0734 06/11/23 0318 06/12/23 0316 06/13/23 0309 06/14/23 0615  AST 187* 123* 79* 53* 38  ALT 232* 187* 147* 116* 88*  ALKPHOS 100 123 142* 137* 122  BILITOT 4.6* 3.4* 1.8* 1.4* 1.1  PROT 5.4* 5.4* 5.4* 5.3* 5.6*  ALBUMIN 2.9* 2.7* 2.6* 2.5* 2.6*   Recent Labs  Lab 06/08/23 1922  LIPASE 19   No results for input(s): "AMMONIA" in the last 168 hours. Coagulation profile No results for input(s): "INR", "PROTIME" in the last 168 hours.  CBC: Recent Labs  Lab 06/08/23 1922 06/09/23 1226 06/10/23 0734 06/12/23 0316 06/13/23 0309  WBC 10.0 10.1 6.2 5.6 6.0  NEUTROABS  --  7.1  --   --   --   HGB 14.3 11.2* 10.8* 11.2* 10.8*  HCT 42.2 33.0* 32.3* 33.5* 33.0*  MCV 97.5 96.2 96.4 98.8 98.2  PLT 235 180 152 178 181   Cardiac Enzymes: No results for input(s): "CKTOTAL", "CKMB", "CKMBINDEX", "TROPONINI" in the last 168 hours. BNP: Invalid input(s): "POCBNP" CBG: No results for input(s): "GLUCAP" in the last 168 hours. D-Dimer No results for input(s): "DDIMER" in the last 72 hours. Hgb A1c No results for input(s): "HGBA1C" in the last 72 hours. Lipid Profile No results for input(s): "CHOL", "HDL", "LDLCALC", "TRIG", "CHOLHDL", "LDLDIRECT" in the last 72 hours. Thyroid function studies No results for input(s): "TSH", "T4TOTAL", "T3FREE", "THYROIDAB" in the last 72 hours.  Invalid input(s): "FREET3" Anemia work up No results for input(s): "VITAMINB12", "FOLATE", "FERRITIN", "TIBC", "IRON", "RETICCTPCT" in the last 72 hours. Microbiology No results found for this or any previous visit (from the past 240 hour(s)).   Discharge Instructions:   Discharge Instructions     Call MD for:  persistant nausea and vomiting   Complete by: As directed    Call MD for:  severe uncontrolled pain   Complete by: As directed    Call MD for:  temperature >100.4   Complete by: As directed    Diet - low sodium heart healthy   Complete by: As directed     Discharge instructions   Complete by: As directed    Follow-up with your primary care provider at the skilled nursing facility in 3 to 5 days.  Check blood work at that time.  Follow-up with general surgery as outpatient as needed for gallbladder problem   Increase activity slowly   Complete by: As directed       Allergies as of 06/14/2023       Reactions   Anesthetics, Amide    Ciprofloxacin Swelling   Celebrex [celecoxib]    Sulfa Antibiotics    Codeine Nausea And Vomiting   Morphine And Codeine Nausea And Vomiting        Medication List     STOP taking these medications    predniSONE 10 MG (21) Tbpk tablet Commonly known as: STERAPRED UNI-PAK 21 TAB       TAKE these medications    albuterol (2.5 MG/3ML) 0.083% nebulizer solution Commonly known as: PROVENTIL Take 3 mLs (2.5 mg total) by nebulization every 2 (two) hours as needed for wheezing.   amoxicillin-clavulanate 875-125 MG tablet Commonly known as: AUGMENTIN Take 1 tablet by mouth 2 (two) times daily for 2 days.   CALTRATE PLUS PO Take by mouth.   denosumab 60 MG/ML Soln injection Commonly known as: PROLIA Inject 60 mg into the skin  every 6 (six) months. Administer in upper arm, thigh, or abdomen   diltiazem 240 MG 24 hr capsule Commonly known as: CARDIZEM CD Take 240 mg by mouth daily.   ergocalciferol 1.25 MG (50000 UT) capsule Commonly known as: VITAMIN D2 Take 50,000 Units by mouth 2 (two) times a week.   Fish Oil 1200 MG Caps Take 2 capsules by mouth daily.   fluticasone 50 MCG/ACT nasal spray Commonly known as: FLONASE Place 1-2 sprays into both nostrils daily.   furosemide 20 MG tablet Commonly known as: LASIX Take 1 tablet (20 mg total) by mouth 2 (two) times daily. What changed:  when to take this additional instructions   hyoscyamine 0.125 MG SL tablet Commonly known as: LEVSIN SL Place 1 tablet (0.125 mg total) under the tongue every 6 (six) hours as needed for cramping.    loperamide 2 MG capsule Commonly known as: IMODIUM Take 1 capsule (2 mg total) by mouth 4 (four) times daily as needed for diarrhea or loose stools.   montelukast 10 MG tablet Commonly known as: SINGULAIR Take 10 mg by mouth daily.   ondansetron 4 MG tablet Commonly known as: ZOFRAN Take 1 tablet (4 mg total) by mouth every 6 (six) hours as needed for nausea.   pantoprazole 20 MG tablet Commonly known as: PROTONIX Take 20 mg by mouth 2 (two) times daily.   potassium chloride SA 20 MEQ tablet Commonly known as: KLOR-CON M Take 2 tablets (40 mEq total) by mouth daily for 5 days.   PreserVision/Lutein Caps Take 2 capsules by mouth daily.   PROBIOTIC PO Take 1 Dose by mouth daily.   Systane Contacts Soln Apply to eye.   TUMS PO Take by mouth daily as needed. chewable        Follow-up Information     Sur[facility], [facility] Follow up.   Specialty: General Surgery Why: As needed for evaluation or discussion to have your gallbladder removed if desired Contact information: [address] [place] [address] [phone]                  Time coordinating discharge: 39 minutes  Signed:  Dequincy Born  [facility] 06/14/2023, 3:27 PM

## 2023-06-15 DIAGNOSIS — G7100 Muscular dystrophy, unspecified: Secondary | ICD-10-CM | POA: Diagnosis not present

## 2023-06-15 DIAGNOSIS — M6281 Muscle weakness (generalized): Secondary | ICD-10-CM | POA: Diagnosis not present

## 2023-06-15 DIAGNOSIS — K819 Cholecystitis, unspecified: Secondary | ICD-10-CM | POA: Diagnosis not present

## 2023-06-15 DIAGNOSIS — K580 Irritable bowel syndrome with diarrhea: Secondary | ICD-10-CM | POA: Diagnosis not present

## 2023-06-15 DIAGNOSIS — E876 Hypokalemia: Secondary | ICD-10-CM | POA: Diagnosis not present

## 2023-06-15 DIAGNOSIS — K8033 Calculus of bile duct with acute cholangitis with obstruction: Secondary | ICD-10-CM | POA: Diagnosis not present

## 2023-06-15 DIAGNOSIS — R278 Other lack of coordination: Secondary | ICD-10-CM | POA: Diagnosis not present

## 2023-06-15 DIAGNOSIS — R2681 Unsteadiness on feet: Secondary | ICD-10-CM | POA: Diagnosis not present

## 2023-06-16 DIAGNOSIS — K8033 Calculus of bile duct with acute cholangitis with obstruction: Secondary | ICD-10-CM | POA: Diagnosis not present

## 2023-06-20 DIAGNOSIS — K580 Irritable bowel syndrome with diarrhea: Secondary | ICD-10-CM | POA: Diagnosis not present

## 2023-06-21 DIAGNOSIS — L03116 Cellulitis of left lower limb: Secondary | ICD-10-CM | POA: Diagnosis not present

## 2023-06-21 NOTE — Telephone Encounter (Signed)
I called patient to discuss. No answer, left a message asking her to call us back. If patient returns call, please ask her if she was able to get PT. If not, does she want to come to an outpatient clinic?

## 2023-06-21 NOTE — Telephone Encounter (Signed)
Can you guys please check on her to ensure that she was able to work with PT? It may be better to try to get her to an outpatient clinic for more aggressive therapy.

## 2023-06-22 DIAGNOSIS — R2681 Unsteadiness on feet: Secondary | ICD-10-CM | POA: Diagnosis not present

## 2023-06-22 DIAGNOSIS — R278 Other lack of coordination: Secondary | ICD-10-CM | POA: Diagnosis not present

## 2023-06-22 DIAGNOSIS — L03116 Cellulitis of left lower limb: Secondary | ICD-10-CM | POA: Diagnosis not present

## 2023-06-22 DIAGNOSIS — M6281 Muscle weakness (generalized): Secondary | ICD-10-CM | POA: Diagnosis not present

## 2023-06-22 DIAGNOSIS — K819 Cholecystitis, unspecified: Secondary | ICD-10-CM | POA: Diagnosis not present

## 2023-06-22 NOTE — Telephone Encounter (Signed)
I called and LMVM for pt that seeing if she was able to get PT at home if not then  may switch to outpt PT.  Please return call.

## 2023-06-23 DIAGNOSIS — G7100 Muscular dystrophy, unspecified: Secondary | ICD-10-CM | POA: Diagnosis not present

## 2023-06-23 DIAGNOSIS — K580 Irritable bowel syndrome with diarrhea: Secondary | ICD-10-CM | POA: Diagnosis not present

## 2023-06-23 NOTE — Telephone Encounter (Signed)
LMVM for pt that have left several messages for her to return.  She has not done this.  Will wait for her to call if she still needs [place].

## 2023-06-24 DIAGNOSIS — R601 Generalized edema: Secondary | ICD-10-CM | POA: Diagnosis not present

## 2023-06-27 DIAGNOSIS — E876 Hypokalemia: Secondary | ICD-10-CM | POA: Diagnosis not present

## 2023-06-27 DIAGNOSIS — L03116 Cellulitis of left lower limb: Secondary | ICD-10-CM | POA: Diagnosis not present

## 2023-06-29 DIAGNOSIS — R278 Other lack of coordination: Secondary | ICD-10-CM | POA: Diagnosis not present

## 2023-06-29 DIAGNOSIS — R2681 Unsteadiness on feet: Secondary | ICD-10-CM | POA: Diagnosis not present

## 2023-06-29 DIAGNOSIS — K819 Cholecystitis, unspecified: Secondary | ICD-10-CM | POA: Diagnosis not present

## 2023-06-29 DIAGNOSIS — M6281 Muscle weakness (generalized): Secondary | ICD-10-CM | POA: Diagnosis not present

## 2023-06-30 DIAGNOSIS — L03116 Cellulitis of left lower limb: Secondary | ICD-10-CM | POA: Diagnosis not present

## 2023-06-30 DIAGNOSIS — K580 Irritable bowel syndrome with diarrhea: Secondary | ICD-10-CM | POA: Diagnosis not present

## 2023-06-30 DIAGNOSIS — G7100 Muscular dystrophy, unspecified: Secondary | ICD-10-CM | POA: Diagnosis not present

## 2023-06-30 DIAGNOSIS — R601 Generalized edema: Secondary | ICD-10-CM | POA: Diagnosis not present

## 2023-07-06 NOTE — Telephone Encounter (Signed)
Pt called wanting to inform the provider that the reason the pt has not had her in home therapy visit is because she was hospitalized. But pt is home now and would like RN to call her to set the visit up. Please advise.

## 2023-07-06 NOTE — Telephone Encounter (Signed)
Called and spoke with patient. She has been in hospital for cholecystitis. She is at home now, pt states PT did reach out to her, but she was in the hospital at the time. Pt said now that she is home they have called her and they will call her back this afternoon to discuss coming out to her home. Pt declined outpatient physical therapy she is very weak, and currently in wheelchair. She would prefer to have physical therapy at home for now. Pt states she needs help getting in and out of the bed and her feet and legs are swollen. Pt was appreciative of the follow up call and the offer of outpatient PT.

## 2023-07-06 NOTE — Telephone Encounter (Signed)
Catherine Harding pt does not need anything from our office right now. She had OT come out, PT will be reaching out to patient.   You sent a message to Pod 1 saying " Can you guys please check on her to ensure that she was able to work with PT? It may be better to try to get her to an outpatient clinic for more aggressive therapy."    This call is just a follow up to your request. I was just letting you know she doesn't want outpatient clinic therapy.Marland Kitchen

## 2023-07-10 DIAGNOSIS — N879 Dysplasia of cervix uteri, unspecified: Secondary | ICD-10-CM | POA: Diagnosis not present

## 2023-07-10 DIAGNOSIS — L03115 Cellulitis of right lower limb: Secondary | ICD-10-CM | POA: Diagnosis not present

## 2023-07-10 DIAGNOSIS — K589 Irritable bowel syndrome without diarrhea: Secondary | ICD-10-CM | POA: Diagnosis not present

## 2023-07-10 DIAGNOSIS — Z87310 Personal history of (healed) osteoporosis fracture: Secondary | ICD-10-CM | POA: Diagnosis not present

## 2023-07-10 DIAGNOSIS — I10 Essential (primary) hypertension: Secondary | ICD-10-CM | POA: Diagnosis not present

## 2023-07-10 DIAGNOSIS — G71039 Limb girdle muscular dystrophy, unspecified: Secondary | ICD-10-CM | POA: Diagnosis not present

## 2023-07-10 DIAGNOSIS — Z79899 Other long term (current) drug therapy: Secondary | ICD-10-CM | POA: Diagnosis not present

## 2023-07-10 DIAGNOSIS — Z9181 History of falling: Secondary | ICD-10-CM | POA: Diagnosis not present

## 2023-07-10 DIAGNOSIS — L03116 Cellulitis of left lower limb: Secondary | ICD-10-CM | POA: Diagnosis not present

## 2023-07-10 DIAGNOSIS — M4120 Other idiopathic scoliosis, site unspecified: Secondary | ICD-10-CM | POA: Diagnosis not present

## 2023-07-10 DIAGNOSIS — Z602 Problems related to living alone: Secondary | ICD-10-CM | POA: Diagnosis not present

## 2023-07-10 DIAGNOSIS — K0820 Unspecified atrophy of edentulous alveolar ridge: Secondary | ICD-10-CM | POA: Diagnosis not present

## 2023-07-10 DIAGNOSIS — Z556 Problems related to health literacy: Secondary | ICD-10-CM | POA: Diagnosis not present

## 2023-07-10 DIAGNOSIS — E8809 Other disorders of plasma-protein metabolism, not elsewhere classified: Secondary | ICD-10-CM | POA: Diagnosis not present

## 2023-07-10 DIAGNOSIS — K219 Gastro-esophageal reflux disease without esophagitis: Secondary | ICD-10-CM | POA: Diagnosis not present

## 2023-07-10 DIAGNOSIS — M810 Age-related osteoporosis without current pathological fracture: Secondary | ICD-10-CM | POA: Diagnosis not present

## 2023-07-10 DIAGNOSIS — K810 Acute cholecystitis: Secondary | ICD-10-CM | POA: Diagnosis not present

## 2023-07-10 DIAGNOSIS — M5430 Sciatica, unspecified side: Secondary | ICD-10-CM | POA: Diagnosis not present

## 2023-07-10 DIAGNOSIS — I491 Atrial premature depolarization: Secondary | ICD-10-CM | POA: Diagnosis not present

## 2023-07-10 DIAGNOSIS — N952 Postmenopausal atrophic vaginitis: Secondary | ICD-10-CM | POA: Diagnosis not present

## 2023-07-10 DIAGNOSIS — H35342 Macular cyst, hole, or pseudohole, left eye: Secondary | ICD-10-CM | POA: Diagnosis not present

## 2023-07-10 DIAGNOSIS — Z85828 Personal history of other malignant neoplasm of skin: Secondary | ICD-10-CM | POA: Diagnosis not present

## 2023-07-10 DIAGNOSIS — M1710 Unilateral primary osteoarthritis, unspecified knee: Secondary | ICD-10-CM | POA: Diagnosis not present

## 2023-07-13 DIAGNOSIS — G7109 Other specified muscular dystrophies: Secondary | ICD-10-CM | POA: Diagnosis not present

## 2023-07-13 DIAGNOSIS — Z0000 Encounter for general adult medical examination without abnormal findings: Secondary | ICD-10-CM | POA: Diagnosis not present

## 2023-07-13 DIAGNOSIS — Z1389 Encounter for screening for other disorder: Secondary | ICD-10-CM | POA: Diagnosis not present

## 2023-07-13 DIAGNOSIS — R7301 Impaired fasting glucose: Secondary | ICD-10-CM | POA: Diagnosis not present

## 2023-07-13 DIAGNOSIS — N1831 Chronic kidney disease, stage 3a: Secondary | ICD-10-CM | POA: Diagnosis not present

## 2023-07-13 DIAGNOSIS — A6920 Lyme disease, unspecified: Secondary | ICD-10-CM | POA: Diagnosis not present

## 2023-07-13 DIAGNOSIS — I129 Hypertensive chronic kidney disease with stage 1 through stage 4 chronic kidney disease, or unspecified chronic kidney disease: Secondary | ICD-10-CM | POA: Diagnosis not present

## 2023-07-13 DIAGNOSIS — K810 Acute cholecystitis: Secondary | ICD-10-CM | POA: Diagnosis not present

## 2023-07-13 DIAGNOSIS — I2510 Atherosclerotic heart disease of native coronary artery without angina pectoris: Secondary | ICD-10-CM | POA: Diagnosis not present

## 2023-07-13 DIAGNOSIS — I700 Atherosclerosis of aorta: Secondary | ICD-10-CM | POA: Diagnosis not present

## 2023-07-13 DIAGNOSIS — I10 Essential (primary) hypertension: Secondary | ICD-10-CM | POA: Diagnosis not present

## 2023-07-13 DIAGNOSIS — R600 Localized edema: Secondary | ICD-10-CM | POA: Diagnosis not present

## 2023-07-13 DIAGNOSIS — E785 Hyperlipidemia, unspecified: Secondary | ICD-10-CM | POA: Diagnosis not present

## 2023-07-13 DIAGNOSIS — D649 Anemia, unspecified: Secondary | ICD-10-CM | POA: Diagnosis not present

## 2023-07-20 DIAGNOSIS — Z961 Presence of intraocular lens: Secondary | ICD-10-CM | POA: Diagnosis not present

## 2023-07-20 DIAGNOSIS — H52203 Unspecified astigmatism, bilateral: Secondary | ICD-10-CM | POA: Diagnosis not present

## 2023-07-20 DIAGNOSIS — H02403 Unspecified ptosis of bilateral eyelids: Secondary | ICD-10-CM | POA: Diagnosis not present

## 2023-07-20 DIAGNOSIS — D3132 Benign neoplasm of left choroid: Secondary | ICD-10-CM | POA: Diagnosis not present

## 2023-07-22 DIAGNOSIS — K810 Acute cholecystitis: Secondary | ICD-10-CM | POA: Diagnosis not present

## 2023-07-22 DIAGNOSIS — K589 Irritable bowel syndrome without diarrhea: Secondary | ICD-10-CM | POA: Diagnosis not present

## 2023-07-22 DIAGNOSIS — M1710 Unilateral primary osteoarthritis, unspecified knee: Secondary | ICD-10-CM | POA: Diagnosis not present

## 2023-07-22 DIAGNOSIS — G71039 Limb girdle muscular dystrophy, unspecified: Secondary | ICD-10-CM | POA: Diagnosis not present

## 2023-07-22 DIAGNOSIS — I10 Essential (primary) hypertension: Secondary | ICD-10-CM | POA: Diagnosis not present

## 2023-07-22 DIAGNOSIS — I491 Atrial premature depolarization: Secondary | ICD-10-CM | POA: Diagnosis not present

## 2023-07-22 DIAGNOSIS — L03116 Cellulitis of left lower limb: Secondary | ICD-10-CM | POA: Diagnosis not present

## 2023-07-22 DIAGNOSIS — H35342 Macular cyst, hole, or pseudohole, left eye: Secondary | ICD-10-CM | POA: Diagnosis not present

## 2023-07-22 DIAGNOSIS — M5430 Sciatica, unspecified side: Secondary | ICD-10-CM | POA: Diagnosis not present

## 2023-07-22 DIAGNOSIS — M4120 Other idiopathic scoliosis, site unspecified: Secondary | ICD-10-CM | POA: Diagnosis not present

## 2023-07-22 DIAGNOSIS — M810 Age-related osteoporosis without current pathological fracture: Secondary | ICD-10-CM | POA: Diagnosis not present

## 2023-07-22 DIAGNOSIS — L03115 Cellulitis of right lower limb: Secondary | ICD-10-CM | POA: Diagnosis not present

## 2023-07-26 DIAGNOSIS — L821 Other seborrheic keratosis: Secondary | ICD-10-CM | POA: Diagnosis not present

## 2023-07-26 DIAGNOSIS — L814 Other melanin hyperpigmentation: Secondary | ICD-10-CM | POA: Diagnosis not present

## 2023-07-26 DIAGNOSIS — Z85828 Personal history of other malignant neoplasm of skin: Secondary | ICD-10-CM | POA: Diagnosis not present

## 2023-07-26 DIAGNOSIS — L570 Actinic keratosis: Secondary | ICD-10-CM | POA: Diagnosis not present

## 2023-07-26 DIAGNOSIS — D2261 Melanocytic nevi of right upper limb, including shoulder: Secondary | ICD-10-CM | POA: Diagnosis not present

## 2023-07-26 DIAGNOSIS — D225 Melanocytic nevi of trunk: Secondary | ICD-10-CM | POA: Diagnosis not present

## 2023-08-11 DIAGNOSIS — H353221 Exudative age-related macular degeneration, left eye, with active choroidal neovascularization: Secondary | ICD-10-CM | POA: Diagnosis not present

## 2023-08-23 DIAGNOSIS — Z1231 Encounter for screening mammogram for malignant neoplasm of breast: Secondary | ICD-10-CM | POA: Diagnosis not present

## 2023-09-01 DIAGNOSIS — M25561 Pain in right knee: Secondary | ICD-10-CM | POA: Diagnosis not present

## 2023-09-06 DIAGNOSIS — I700 Atherosclerosis of aorta: Secondary | ICD-10-CM | POA: Diagnosis not present

## 2023-09-06 DIAGNOSIS — N1831 Chronic kidney disease, stage 3a: Secondary | ICD-10-CM | POA: Diagnosis not present

## 2023-09-06 DIAGNOSIS — I2510 Atherosclerotic heart disease of native coronary artery without angina pectoris: Secondary | ICD-10-CM | POA: Diagnosis not present

## 2023-09-06 DIAGNOSIS — I87323 Chronic venous hypertension (idiopathic) with inflammation of bilateral lower extremity: Secondary | ICD-10-CM | POA: Diagnosis not present

## 2023-09-06 DIAGNOSIS — H3530 Unspecified macular degeneration: Secondary | ICD-10-CM | POA: Diagnosis not present

## 2023-09-06 DIAGNOSIS — N200 Calculus of kidney: Secondary | ICD-10-CM | POA: Diagnosis not present

## 2023-09-06 DIAGNOSIS — K810 Acute cholecystitis: Secondary | ICD-10-CM | POA: Diagnosis not present

## 2023-09-06 DIAGNOSIS — E785 Hyperlipidemia, unspecified: Secondary | ICD-10-CM | POA: Diagnosis not present

## 2023-09-06 DIAGNOSIS — R7301 Impaired fasting glucose: Secondary | ICD-10-CM | POA: Diagnosis not present

## 2023-09-06 DIAGNOSIS — I129 Hypertensive chronic kidney disease with stage 1 through stage 4 chronic kidney disease, or unspecified chronic kidney disease: Secondary | ICD-10-CM | POA: Diagnosis not present

## 2023-09-06 DIAGNOSIS — G7109 Other specified muscular dystrophies: Secondary | ICD-10-CM | POA: Diagnosis not present

## 2023-09-11 NOTE — Progress Notes (Unsigned)
Cardiology Office Note:  .   Date:  09/12/2023  ID:  Catherine Harding, DOB 25-Jan-1930, MRN [id number] PCP: Adrian Prince, MD  [facility] Providers Cardiologist:  Yates Decamp, MD   History of Present Illness: .   Catherine Harding is a 89 y.o. Caucasian female with history of hypertension, hyperlipidemia, muscular dystrophy (follows with [facility]), IBS-D, PSEUDOCHOLINESTERASE inhibitor deficiency (high risk for complications from general anesthesia).  Denies history of tobacco or alcohol use, diabetes, family premature CAD. Patient has undergone low risk stress test and echocardiogram which noted preserved LVEF with grade 1 diastolic dysfunction and moderate LVH. Previous cardiac monitor revealed PAC burden of 15%, however as patient has been asymptomatic she opted to continue watchful waiting.   Last hospitalization was in August 2024 with acute cholecystitis managed conservatively.  Discussed the use of AI scribe software for clinical note transcription with the patient, who gave verbal consent to proceed.  History of Present Illness   The patient, a 89 year old with a history of muscular dystrophy and pseudocolonastase inhibitor deficiency, presents for a follow-up visit. She was hospitalized in August due to a gallbladder stone, which was managed conservatively due to concerns about general anesthesia and her age. Since then, she has been doing well and has not had any recurrence of gallbladder symptoms.  The patient lives alone and uses a rollator for mobility due to weakness from muscular dystrophy. She also has a wheelchair nearby for when her legs get too weak. She has been in therapy since August and has been doing well overall.  The patient has a history of sinus tachycardia and extra heartbeats, which has caused some concern among her healthcare providers. However, she has been reassured that this is not atrial fibrillation. She occasionally feels a  fluttering sensation in her chest, which may be related to these extra heartbeats.        Review of Systems  Cardiovascular:  Positive for leg swelling (chronic and wears stockings). Negative for chest pain and dyspnea on exertion.    Labs   Lab Results  Component Value Date   NA 138 06/14/2023   K 3.2 (L) 06/14/2023   CO2 24 06/14/2023   GLUCOSE 166 (H) 06/14/2023   BUN 9 06/14/2023   CREATININE 0.75 06/14/2023   CALCIUM 8.6 (L) 06/14/2023   EGFR 58 (L) 03/16/2023   GFRNONAA >60 06/14/2023      Latest Ref Rng & Units 06/14/2023    6:15 AM 06/13/2023    3:09 AM 06/12/2023    3:16 AM  BMP  Glucose 70 - 99 mg/dL 295  284  132   BUN 8 - 23 mg/dL 9  7  5    Creatinine 0.44 - 1.00 mg/dL 4.40  1.02  7.25   Sodium 135 - 145 mmol/L 138  135  136   Potassium 3.5 - 5.1 mmol/L 3.2  3.5  3.0   Chloride 98 - 111 mmol/L 105  110  105   CO2 22 - 32 mmol/L 24  22  21    Calcium 8.9 - 10.3 mg/dL 8.6  8.2  8.6       Latest Ref Rng & Units 06/13/2023    3:09 AM 06/12/2023    3:16 AM 06/10/2023    7:34 AM  CBC  WBC 4.0 - 10.5 K/uL 6.0  5.6  6.2   Hemoglobin 12.0 - 15.0 g/dL 36.6  44.0  34.7   Hematocrit 36.0 - 46.0 % 33.0  33.5  32.3   Platelets 150 - 400 K/uL 181  178  152    Physical Exam:   VS:  BP (!) 142/62 (BP Location: Right Arm, Patient Position: Sitting, Cuff Size: Normal)   Pulse 73   Resp 16   Ht 5\' 1"  (1.549 m)   Wt 143 lb 12.8 oz (65.2 kg)   SpO2 95%   BMI 27.17 kg/m    Wt Readings from Last 3 Encounters:  09/12/23 143 lb 12.8 oz (65.2 kg)  06/14/23 151 lb 10.8 oz (68.8 kg)  03/16/23 150 lb 8 oz (68.3 kg)     Physical Exam Neck:     Vascular: No carotid bruit or JVD.  Cardiovascular:     Rate and Rhythm: Regular rhythm. Tachycardia present. Occasional Extrasystoles are present.    Pulses: Intact distal pulses.          Dorsalis pedis pulses are 1+ on the right side and 1+ on the left side.       Posterior tibial pulses are 1+ on the right side and 1+ on the left  side.     Heart sounds: Normal heart sounds. No murmur heard.    No gallop.  Pulmonary:     Effort: Pulmonary effort is normal.     Breath sounds: Normal breath sounds.  Abdominal:     General: Bowel sounds are normal.     Palpations: Abdomen is soft.  Musculoskeletal:     Right lower leg: Edema (1-2+ pitting below knee) present.     Left lower leg: Edema (1-2+ pitting below knee) present.     Studies Reviewed: Marland Kitchen    Ambulatory cardiac telemetry 14 days 2022: Frequent PACs and PAC burden of 15%, no atrial fibrillation, no other significant abnormality.  Myocardial perfusion scan 2022: Low risk, no evidence of ischemia.  ECHOCARDIOGRAM COMPLETE 06/09/2023  1. Left ventricular ejection fraction, by estimation, is 60 to 65%. The left ventricle has normal function. The left ventricle has no regional wall motion abnormalities. There is mild left ventricular hypertrophy. Left ventricular diastolic parameters are indeterminate. 2. Right ventricular systolic function is normal. The right ventricular size is normal. There is normal pulmonary artery systolic pressure. 3. Left atrial size was mildly dilated. 4. The mitral valve is degenerative. No evidence of mitral valve regurgitation. No evidence of mitral stenosis. 5. The aortic valve is tricuspid. Aortic valve regurgitation is not visualized. Aortic valve sclerosis is present, with no evidence of aortic valve stenosis. 6. The inferior vena cava is dilated in size with >50% respiratory variability, suggesting right atrial pressure of 8 mmHg. 7. Rhythm strip during this exam demonstrates normal sinus rhythm. EKG:   EKG 09/12/2023: EKG 09/12/2023: Sinus tachycardia at rate of 109 bpm, leftward axis, right bundle branch block.  PACs.  Nonspecific T wave abnormality. Compared to 06/08/2023, no significant change.  Medications and allergies    Allergies  Allergen Reactions   Anesthetics, Amide    Ciprofloxacin Swelling   Celebrex [Celecoxib]     Sulfa Antibiotics    Codeine Nausea And Vomiting   Morphine And Codeine Nausea And Vomiting     Current Outpatient Medications:    albuterol (PROVENTIL) (2.5 MG/3ML) 0.083% nebulizer solution, Take 3 mLs (2.5 mg total) by nebulization every 2 (two) hours as needed for wheezing., Disp: , Rfl:    Artificial Tear Solution (SYSTANE CONTACTS) SOLN, Apply to eye., Disp: , Rfl:    Calcium Carbonate Antacid (TUMS PO), Take by mouth daily as needed. chewable, Disp: ,  Rfl:    Calcium Carbonate-Vit D-Min (CALTRATE PLUS PO), Take by mouth.  , Disp: , Rfl:    denosumab (PROLIA) 60 MG/ML SOLN injection, Inject 60 mg into the skin every 6 (six) months. Administer in upper arm, thigh, or abdomen, Disp: , Rfl:    diltiazem (CARDIZEM CD) 240 MG 24 hr capsule, Take 240 mg by mouth daily.  , Disp: , Rfl:    ergocalciferol (VITAMIN D2) 50000 UNITS capsule, Take 50,000 Units by mouth 2 (two) times a week. , Disp: , Rfl:    fluticasone (FLONASE) 50 MCG/ACT nasal spray, Place 1-2 sprays into both nostrils daily., Disp: , Rfl:    furosemide (LASIX) 20 MG tablet, Take 1 tablet (20 mg total) by mouth 2 (two) times daily., Disp: , Rfl:    hyoscyamine (LEVSIN SL) 0.125 MG SL tablet, Place 1 tablet (0.125 mg total) under the tongue every 6 (six) hours as needed for cramping., Disp: , Rfl:    loperamide (IMODIUM) 2 MG capsule, Take 1 capsule (2 mg total) by mouth 4 (four) times daily as needed for diarrhea or loose stools., Disp: , Rfl:    metoprolol succinate (TOPROL XL) 25 MG 24 hr tablet, Take 1 tablet (25 mg total) by mouth daily., Disp: 90 tablet, Rfl: 0   montelukast (SINGULAIR) 10 MG tablet, Take 10 mg by mouth daily., Disp: , Rfl:    Multiple Vitamins-Minerals (PRESERVISION/LUTEIN) CAPS, Take 2 capsules by mouth daily., Disp: , Rfl:    Omega-3 Fatty Acids (FISH OIL) 1200 MG CAPS, Take 2 capsules by mouth daily., Disp: , Rfl:    ondansetron (ZOFRAN) 4 MG tablet, Take 1 tablet (4 mg total) by mouth every 6 (six)  hours as needed for nausea., Disp: , Rfl:    pantoprazole (PROTONIX) 20 MG tablet, Take 20 mg by mouth 2 (two) times daily., Disp: , Rfl:    Probiotic Product (PROBIOTIC PO), Take 1 Dose by mouth daily., Disp: , Rfl:    potassium chloride SA (KLOR-CON M) 20 MEQ tablet, Take 2 tablets (40 mEq total) by mouth daily for 5 days., Disp: , Rfl:    ASSESSMENT AND PLAN: .      ICD-10-CM   1. PAC (premature atrial contraction)  I49.1 metoprolol succinate (TOPROL XL) 25 MG 24 hr tablet    2. Sinus tachycardia  R00.0 metoprolol succinate (TOPROL XL) 25 MG 24 hr tablet    3. Essential hypertension  I10      Assessment and Plan    Sinus Tachycardia and PAC Patient reports occasional palpitations. EKG shows sinus tachycardia and PACs, but no atrial fibrillation. Discussed the benefits and risks of adding a beta-blocker to her current regimen. -Start Metoprolol Succinate 25mg  daily. -Continue Diltiazem. -If Metoprolol Succinate causes any adverse effects, discontinue use.  She can follow-up with Dr. Andria [facility] for prescription refills and further management.  Her blood pressure is also slightly elevated, hopefully addition of metoprolol succinate will help with improvement in heart rate control as well.  In view of persistent sinus tachycardia I am not as worried about heart block although she is 89 years of age.  Muscular Dystrophy Patient reports weakness in legs and uses a rollator for mobility. No change in management discussed. -Continue current management.  Cholecystitis Patient had a gallbladder stone that was managed conservatively due to her age and comorbidities during her recent hospitalization in July 2024. No current symptoms. -Continue current management.  Follow-up Patient to follow-up with primary care provider, Dr. Ardyth Harps, for  further management. Cardiology follow-up as needed.    Signed,  Yates Decamp, MD, [facility] 09/12/2023, 11:54 AM [facility] [address] [place], [place] [address] Phone: [phone]. Fax:  [phone]

## 2023-09-12 ENCOUNTER — Encounter: Payer: Self-pay | Admitting: Cardiology

## 2023-09-12 ENCOUNTER — Ambulatory Visit: Payer: PPO | Admitting: Internal Medicine

## 2023-09-12 ENCOUNTER — Ambulatory Visit: Payer: PPO | Attending: Internal Medicine | Admitting: Cardiology

## 2023-09-12 VITALS — BP 142/62 | HR 73 | Resp 16 | Ht 61.0 in | Wt 143.8 lb

## 2023-09-12 DIAGNOSIS — I10 Essential (primary) hypertension: Secondary | ICD-10-CM | POA: Diagnosis not present

## 2023-09-12 DIAGNOSIS — I491 Atrial premature depolarization: Secondary | ICD-10-CM | POA: Diagnosis not present

## 2023-09-12 DIAGNOSIS — R000 Tachycardia, unspecified: Secondary | ICD-10-CM

## 2023-09-12 MED ORDER — METOPROLOL SUCCINATE ER 25 MG PO TB24
25.0000 mg | ORAL_TABLET | Freq: Every day | ORAL | 0 refills | Status: DC
Start: 2023-09-12 — End: 2023-12-08

## 2023-09-12 NOTE — Patient Instructions (Signed)
Medication Instructions:  Your physician has recommended you make the following change in your medication: Start Metoprolol Succinate 25 mg by mouth daily   *If you need a refill on your cardiac medications before your next appointment, please call your pharmacy*   Lab Work: none If you have labs (blood work) drawn today and your tests are completely normal, you will receive your results only by: MyChart Message (if you have MyChart) OR A paper copy in the mail If you have any lab test that is abnormal or we need to change your treatment, we will call you to review the results.   Testing/Procedures: none   Follow-Up: At [facility], you and your health needs are our priority.  As part of our continuing mission to provide you with exceptional heart care, we have created designated Provider Care Teams.  These Care Teams include your primary Cardiologist (physician) and Advanced Practice Providers (APPs -  Physician Assistants and Nurse Practitioners) who all work together to provide you with the care you need, when you need it.  We recommend signing up for the patient portal called "MyChart".  Sign up information is provided on this After Visit Summary.  MyChart is used to connect with patients for Virtual Visits (Telemedicine).  Patients are able to view lab/test results, encounter notes, upcoming appointments, etc.  Non-urgent messages can be sent to your provider as well.   To learn more about what you can do with MyChart, go to [email or web address].    Your next appointment:   As needed  Provider:   Yates Decamp, MD     Other Instructions

## 2023-09-19 ENCOUNTER — Telehealth: Payer: Self-pay | Admitting: Family Medicine

## 2023-09-19 NOTE — Telephone Encounter (Signed)
Pt called to verify appointment

## 2023-09-21 ENCOUNTER — Ambulatory Visit (INDEPENDENT_AMBULATORY_CARE_PROVIDER_SITE_OTHER): Payer: PPO | Admitting: Neurology

## 2023-09-21 ENCOUNTER — Encounter: Payer: Self-pay | Admitting: Neurology

## 2023-09-21 VITALS — BP 169/70 | HR 75 | Ht 61.0 in | Wt 150.0 lb

## 2023-09-21 DIAGNOSIS — G71039 Limb girdle muscular dystrophy, unspecified: Secondary | ICD-10-CM

## 2023-09-21 DIAGNOSIS — M4120 Other idiopathic scoliosis, site unspecified: Secondary | ICD-10-CM | POA: Diagnosis not present

## 2023-09-21 MED ORDER — FISH OIL 1200 MG PO CAPS: 2.0000 | ORAL_CAPSULE | Freq: Every day | ORAL | 5 refills | Status: AC

## 2023-09-21 MED ORDER — COENZYME Q10 30 MG PO CAPS
30.0000 mg | ORAL_CAPSULE | Freq: Three times a day (TID) | ORAL | 5 refills | Status: AC
Start: 2023-09-21 — End: ?

## 2023-09-21 NOTE — Patient Instructions (Signed)
Muscular Dystrophy  Muscular dystrophy is any condition in a group of diseases that involve muscle weakness and muscle loss. This can occur in the muscles used for body movements (voluntary muscles) or in the muscles used for breathing and heartbeat (involuntary muscles). Most types of muscular dystrophy begin to cause symptoms during childhood. Some types do not cause symptoms until later in life. The most common types of muscular dystrophy are: Duchenne. This is the most common type. Symptoms usually begin by age 96 and start in the upper arms and legs. Catherine Harding. This type is usually milder than Duchenne. Symptoms usually begin by age 3 and start in the arms and legs. Myotonic. This is the most common type that affects adults. Symptoms usually begin by age 968. This type makes muscles tight and unable to relax. It usually starts in the arms, face, hands, or legs. Limb-girdle. Weakness may begin in childhood or adult years. This type starts in the hips and shoulders. Facioscapulohumeral. This type affects the face and shoulders. Symptoms may start in childhood or adult years. Congenital. This type is present at birth. It causes muscle weakness in the upper body and legs. What are the causes? This condition is caused by changes (mutations) in the genes. Those genes are needed to make proteins for healthy muscles. Different mutations cause different types of muscular dystrophy. These gene mutations may: Be present at birth (congenital). The mutation is passed from parent to child (inherited). Develop on their own (spontaneous). The mutation occurs when the baby is growing in the womb, but the mutation was not passed from mother to child. What increases the risk? This condition is more likely to develop in: People who have a family history of the condition. Males. The most common type of muscular dystrophy occurs more often in boys than in girls. What are the signs or symptoms? Symptoms of this  condition vary, depending on the type of muscular dystrophy that you have. Early signs and symptoms of muscular dystrophy in childhood may include: Frequent falling or clumsiness. Having trouble getting into a standing position. Trouble running, walking, jumping, or climbing stairs. A waddling walk. Walking on toes. Painful or stiff muscles. Symptoms of more advanced muscular dystrophy may include: Becoming unable to walk. Trouble pushing things like a wagon or tricycle. Curving of the spine. Trouble swallowing. Trouble breathing. Heart failure. How is this diagnosed? This condition may be diagnosed with: A physical exam. Your medical history. Blood tests. Tests to measure the electrical activity of muscles (electromyogram). A procedure to remove a sample of muscle fibers to be examined under a microscope (muscle biopsy). Heart and lung function tests. How is this treated? There is no cure for this condition. However, treatment may slow worsening of the disease and help you to stay active. Treatment may include: Occupational, physical, and speech therapy. Assistive devices, such as a brace, cane, walker, or wheelchair. Medicines to: Reduce swelling (steroids). Improve heart function and bone health. Surgery to correct or prevent complications, such as eye or heart problems. Counseling to support mental health. A feeding tube to assist with nutrition. A breathing machine (ventilator). Follow these instructions at home: Eating and drinking  Eat a healthy, balanced diet. Do not try to treat this condition with special diets or supplements. These do not help to relieve the symptoms of this condition. Activity Try to stay physically active. Ask your health care provider what activities are safe for you. Do exercises as told by your health care provider. Safety  Take  steps to prevent falls. If possible: Remodel a bathroom to make it accessible for a wheelchair. Add a chair or  bench to the shower. Install grab bars for your tub, shower, and toilet. Lower beds to make getting in and out easier. Install assistive devices throughout your home. This will help when you are going from sitting to standing. If necessary, make changes in your home to improve access: Widen doors and install a wheelchair ramp. Replace doorknobs with lever handles. This will make it easier to open doors. Install pull-out shelving in cabinets. Place items such as plates and cups in low drawers. Consider controls that allow you to turn off lights if you are seated or have left a room. General instructions  Learn as much as you can about muscular dystrophy. Work closely with your treatment team. Home care needs may change over time. Take over-the-counter and prescription medicines only as told by your health care provider. Stay up to date on all immunizations, including the yearly (annual) flu (influenza) vaccine. Try to maintain a healthy weight. You may need to follow a low-calorie eating plan to maintain your weight if you cannot be active. Do not use any products that contain nicotine or tobacco. These products include cigarettes, chewing tobacco, and vaping devices, such as e-cigarettes. If you need help quitting, ask your health care provider. Keep all follow-up visits. This is important. Where to find more information Ask your health care provider to suggest resources in your area for emotional or caregiving support. Muscular Dystrophy Association: ForeclosureSheet.fi Contact a health care provider if: Your symptoms change. You have trouble taking care of yourself at home. Get help right away if: You have problems with breathing or swallowing. It is no longer safe or possible for you to care for yourself at home. These symptoms may be an emergency. Get help right away. Call 911. Do not wait to see if the symptoms will go away. Do not drive yourself to the hospital. Summary Muscular  dystrophy is any condition in a group of diseases that involve muscle weakness and muscle loss. Symptoms of this condition vary, depending on the type of muscular dystrophy. There is no cure for muscular dystrophy. However, treatment may slow the worsening of the disease. You may need to make changes in your home to make it safer and easier to care for yourself. This information is not intended to replace advice given to you by your health care provider. Make sure you discuss any questions you have with your health care provider. Document Revised: 08/25/2021 Document Reviewed: 08/25/2021 Elsevier Patient Education  2024 [employer].

## 2023-09-21 NOTE — Progress Notes (Signed)
Provider:  Melvyn Novas, MD  Primary Care Physician:  Adrian Prince, MD [address] [place] [address]     Referring Provider: Adrian Prince, Md [address] [place],  [place] [address]          Chief Complaint according to patient   Patient presents with:     New Patient (Initial Visit)           HISTORY OF PRESENT ILLNESS:  Catherine Harding is a 89 y.o. female patient who is here for revisit 09/21/2023.Marland Kitchen  Chief concern according to patient :  " I have now needed a wheelchair, in my one storey home, I started 18 years ago with limb girdle atrophy and advanced from a cane to a walker. Recently to a wheelchair ( last 6 months) I still live alone ! I fall out of bed sometimes.  My feet will just slide, and my hips just give out, my scoliosis worsened with this dystrophy. I had a meniscus injury, just got a shot of cortison.  Her left foot and leg is badly swollen      REVISIT- REASON FOR VISIT: Follow-up for gait abnormality. Patient here since 17 years ago, first by Dr Sandria Manly.  I have the pleasure of seeing Catherine Harding on 12-29-2021, a meanwhile 89 year old Caucasian right-handed lady that is still able to ambulate but only with a walker.  She has not had any falls, is very careful. She has been also diagnosed with macular degeneration. She has IBS- D and feels often limited by this. She is followed by Dr Jacinto Halim and Dr Evlyn Kanner.  Older Sister in [place] died in 01-15-2021 at 24 -and without LGMD-  Other sister had worse LGMD, deceased. Has seen Dr Sandria Manly .  She is almost tearful when she reports her 5 year-old daughter was diagnosed with Alzheimer's dementia and she already noted personality changes. Her younger child is still in high school. 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  She was referred to [facility]- and this is the place to be for her.        2020: This Patient cannot walk without assistance.  She has been followed in this office for the last  14 years, and has an adult onset limb-girdle muscular dystrophy.  She is using the walker even inside her home.  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  In the past visits we often spoke also about abdominal and flank pain but a CT of the abdomen did not confirm diverticulosis. IBS- diarrhea was diagnosed, has been on diuretics also- she has ankle edema. No varicosis.  Her feet are swollen, not weeping.  She can't put compression stocking on without assistance. As soon as she goes to bed she starts Burping. She already excluded onions, peppers and leeks from her diet.  Dr Evlyn Kanner ordered echo and vein examination to no results. She suffers from hypokalemia, too.  Nobody has given her an explanation why the legs keep swelling, painfully.    Interval history from 06/01/2017, I have the pleasure of seeing Catherine Harding N/A scheduled yearly revisit, her last 2 appointments were with NP Darrol Angel. The patient states that her gait has not further deteriorated, she is using a seated walker, she does have abdominal and flank pain and was thought to suffer from diverticulitis, but his CT of the abdomen has not confirmed the presence of inflammatory changes. She now  contracted a urinary tract infection, she has had a difficult summer due to abnormal digestion and infection. She is seeing dr Dulce Sellar [facility] @ [facility] today.  The patient had developed hip pain in the year 2014 and had multiple falls that year, but steroid injections into the hip has helped greatly and she had no recurrence since. Her last fall was last November.  She now uses the walker regularly outside of the home, in home she " wobbles ' from wall to furniture. She has had dizzyspells. Improved after d/c Hyoscyamines.     Catherine Harding,  a 89 year old female returns for follow-up on her limb girdle dystrophy and associated gait disorder. She was last seen by me on 07/05/2013. She has a history of  limb girdle muscular  dystrophy as well as a secondary scoliosis. The scoliosis has led to some stiffness in her lower extremities problems to arise from a chair or from a seated position. The stiffness is worse if she remained seated for a longer period of time. She reports that just driving to town for 30 or 40 minutes would be enough to have problems. She is however still driving, her problem is to enter and leave the car. In 2007 this established patient underwent a EMG and nerve conduction study, which showed only  a mild peripheral neuropathy. Her neurologic symptoms are otherwise unchanged- she has the expected progression of gait difficulties,  she does have a loss of balance /poor sense of balance and she is walking with a walker to stabilize. She reports using a cane at home. Within the boundaries of her home she can maneuver by holding onto the wall or furniture if needed. She does not use an assistive device per se indoors. She reports sciatic nerve pain. Hip pain- treated with meloxicam and prednisone  But only found relief after sterid injections.    she has had 3 falls in the 2014 , but none in 2015- and all occurred when she was not using her cane outdoors.    HPI _ 2016 ;Patients sister was never diagnosed with Limb Girdle-Muscular Dystrophy, but died wheelchair bound at age 87 in 93.  Another sister is still alive at age 71 and in good shape. Lucila Maine is now 33. Neither child nor her grandchild have any signs of limb- girdle muscle atrophy.  The patient has a history of walking " funny " for probably over a decade may be to. But she was only diagnosed after presenting to the neurology office 9 years ago. There is a family history as her sister was probably affected by the same dystrophy type. None of her children have shown any symptoms at this time and her grandchildren are unaffected also.  Two of her children have  scoliosis , which can be an early symptom. It was her then 85 year old grandson who spotted and  named her gait " [place] "  She does report left-sided sciatica and she has a very palpable tender spot at L5 L4. The patella reflex is preserved. She found some relief after an injection. I would like for her to have physical therapy and perhaps massage therapy. Will refer to PT , she had not been seen by neuro rehab.         Review of Systems: Out of a complete 14 system review, the patient complains of only the following symptoms, and all other reviewed systems are negative.:   Constitutional: Occasional fatigue Leg edema , left over right, not hot  but warm, painful to touch, not weeping.  Vision: impaired by macular generation  Abdominal cramping. IBS diarrhea.  Musculoskeletal: Muscle cramps, walking difficulty, scoliosis, sciatica. Weakness of gait, limb girdle distribution,"  penguin gait " now dependent on  Handle bars - and walker.  Torn rotator cuff-    Macular degeneration , vision loss, 20/ 40 one wet and one eye dry. Some hearing loss.   Social History   Socioeconomic History   Marital status: Divorced    Spouse name: Not on file   Number of children: 3   Years of education: 13   Highest education level: Not on file  Occupational History   Occupation: retired    Comment: Haematologist at [employer]  Tobacco Use   Smoking status: Never   Smokeless tobacco: Never  Vaping Use   Vaping status: Never Used  Substance and Sexual Activity   Alcohol use: No    Alcohol/week: 0.0 standard drinks of alcohol    Comment: rare   Drug use: No   Sexual activity: Never    Birth control/protection: Surgical  Other Topics Concern   Not on file  Social History Narrative   Patient lives at home alone and she is divorced. Patient is retired.   Right handed.   Caffeine- one cup daily.   Uses walker.   Grown children, live 7 miles away.     Social Determinants of Health   Financial Resource Strain: Not on file  Food Insecurity: No Food Insecurity (06/09/2023)   Hunger  Vital Sign    Worried About Running Out of Food in the Last Year: Never true    Ran Out of Food in the Last Year: Never true  Transportation Needs: No Transportation Needs (06/09/2023)   PRAPARE - Administrator, Civil Service (Medical): No    Lack of Transportation (Non-Medical): No  Physical Activity: Not on file  Stress: Not on file  Social Connections: Not on file    Family History  Problem Relation Age of Onset   Hypertension Sister     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   Macular hole of left eye    Muscular dystrophy (HCC)    Osteoporosis    pelvic fracture   Pseudocholinesterase deficiency    Scoliosis     Past Surgical History:  Procedure Laterality Date   ABDOMINAL HYSTERECTOMY     TAH BSO   BREAST LUMPECTOMY  1971   benign   CESAREAN SECTION     x2   COLPOSCOPY     EYE SURGERY  2003   left   TOTAL ABDOMINAL HYSTERECTOMY W/ BILATERAL SALPINGOOPHORECTOMY  1981   TRIGGER FINGER RELEASE  07/28/2012   Procedure: RELEASE TRIGGER FINGER/A-1 PULLEY;  Surgeon: Wyn Forster., MD;  Location: [facility];  Service: Orthopedics;  Laterality: Right;  EXCISION CYST RIGHT LONG A-1 RELEASE A-1 RIGHT LONG      Current Outpatient Medications on File Prior to Visit  Medication Sig Dispense Refill   albuterol (PROVENTIL) (2.5 MG/3ML) 0.083% nebulizer solution Take 3 mLs (2.5 mg total) by nebulization  every 2 (two) hours as needed for wheezing.     Artificial Tear Solution (SYSTANE CONTACTS) SOLN Apply to eye.     Calcium Carbonate Antacid (TUMS PO) Take by mouth daily as needed. chewable     Calcium Carbonate-Vit  D-Min (CALTRATE PLUS PO) Take by mouth.       denosumab (PROLIA) 60 MG/ML SOLN injection Inject 60 mg into the skin every 6 (six) months. Administer in upper arm, thigh, or abdomen     diltiazem (CARDIZEM CD) 240 MG 24 hr capsule Take 240 mg by mouth daily.       ergocalciferol (VITAMIN D2) 50000 UNITS capsule Take 50,000 Units by mouth 2 (two) times a week.      fluticasone (FLONASE) 50 MCG/ACT nasal spray Place 1-2 sprays into both nostrils daily.     furosemide (LASIX) 20 MG tablet Take 1 tablet (20 mg total) by mouth 2 (two) times daily.     hyoscyamine (LEVSIN SL) 0.125 MG SL tablet Place 1 tablet (0.125 mg total) under the tongue every 6 (six) hours as needed for cramping.     loperamide (IMODIUM) 2 MG capsule Take 1 capsule (2 mg total) by mouth 4 (four) times daily as needed for diarrhea or loose stools.     metoprolol succinate (TOPROL XL) 25 MG 24 hr tablet Take 1 tablet (25 mg total) by mouth daily. 90 tablet 0   montelukast (SINGULAIR) 10 MG tablet Take 10 mg by mouth daily.     Multiple Vitamins-Minerals (PRESERVISION/LUTEIN) CAPS Take 2 capsules by mouth daily.     Omega-3 Fatty Acids (FISH OIL) 1200 MG CAPS Take 2 capsules by mouth daily.     ondansetron (ZOFRAN) 4 MG tablet Take 1 tablet (4 mg total) by mouth every 6 (six) hours as needed for nausea.     pantoprazole (PROTONIX) 20 MG tablet Take 20 mg by mouth 2 (two) times daily.     Probiotic Product (PROBIOTIC PO) Take 1 Dose by mouth daily.     potassium chloride SA (KLOR-CON M) 20 MEQ tablet Take 2 tablets (40 mEq total) by mouth daily for 5 days.     No current facility-administered medications on file prior to visit.    Allergies  Allergen Reactions   Anesthetics, Amide    Ciprofloxacin Swelling   Celebrex [Celecoxib]    Sulfa Antibiotics    Codeine Nausea And Vomiting   Morphine And Codeine Nausea And Vomiting     DIAGNOSTIC DATA (LABS, IMAGING, TESTING) - I reviewed patient records, labs, notes, testing and  imaging myself where available.  Lab Results  Component Value Date   WBC 6.0 06/13/2023   HGB 10.8 (L) 06/13/2023   HCT 33.0 (L) 06/13/2023   MCV 98.2 06/13/2023   PLT 181 06/13/2023      Component Value Date/Time   NA 138 06/14/2023 0615   NA 139 03/16/2023 1340   K 3.2 (L) 06/14/2023 0615   CL 105 06/14/2023 0615   CO2 24 06/14/2023 0615   GLUCOSE 166 (H) 06/14/2023 0615   BUN 9 06/14/2023 0615   BUN 16 03/16/2023 1340   CREATININE 0.75 06/14/2023 0615   CREATININE 0.76 08/28/2012 1249   CALCIUM 8.6 (L) 06/14/2023 0615   CALCIUM 10.1 08/28/2012 1249   PROT 5.6 (L) 06/14/2023 0615   PROT 6.6 03/16/2023 1340   ALBUMIN 2.6 (L) 06/14/2023 0615   ALBUMIN 4.1 03/16/2023 1340   AST 38 06/14/2023 0615   ALT 88 (H) 06/14/2023 0615  ALKPHOS 122 06/14/2023 0615   BILITOT 1.1 06/14/2023 0615   BILITOT 0.6 03/16/2023 1340   GFRNONAA >60 06/14/2023 0615   GFRAA >90 07/27/2012 1100   No results found for: "CHOL", "HDL", "LDLCALC", "LDLDIRECT", "TRIG", "CHOLHDL" No results found for: "HGBA1C" No results found for: "VITAMINB12" No results found for: "TSH"  PHYSICAL EXAM:  Today's Vitals   09/21/23 1417  BP: (!) 169/70  Pulse: 75  Weight: 150 lb (68 kg)  Height: 5\' 1"  (1.549 m)   Body mass index is 28.34 kg/m.   Wt Readings from Last 3 Encounters:  09/21/23 150 lb (68 kg)  09/12/23 143 lb 12.8 oz (65.2 kg)  06/14/23 151 lb 10.8 oz (68.8 kg)     Ht Readings from Last 3 Encounters:  09/21/23 5\' 1"  (1.549 m)  09/12/23 5\' 1"  (1.549 m)  06/09/23 5\' 1"  (1.549 m)      General: The patient is awake, alert and appears not in acute distress. The patient is well groomed. Head: Normocephalic, atraumatic.  The patient is awake, alert and appears in  distress. The patient is well groomed.  Head: Normocephalic, atraumatic.  Neck is supple.  16 inch circumference.  Cardiovascular: Regular rate and rhythm, without murmurs or carotid bruit, and without distended neck veins.   Respiratory: Lungs are clear to auscultation.  Skin: Without evidence of edema, or rash  Trunk:  Severe scolisis, spinal stenosis.  Neurologic exam :  The patient is awake and alert, oriented to place and time. Memory subjective described as intact.  There is a normal attention span & concentration ability.  Speech is fluent without dysarthria, dysphonia or aphasia.  Mood and affect are appropriate.  Cranial nerves:  No change in taste and smell. Pupils are equal and briskly reactive to light.  Visual fields by finger perimetry are not intact- macular hole left eye. Age related.  Dry type on the right, wet type on the left.  Hearing to finger rub intact.  Facial sensation intact to fine touch.  Facial motor strength is symmetric and tongue and uvula move midline.  Motor exam:  weak hip flexion and adduction,  Sensory: deep pain in thigh and calves. No knee pain.  Fine touch, pinprick and vibration were severely affected in the feet. Numbness-  Coordination: Rapid alternating movements in the fingers/hands is normal. Finger-to-nose maneuver tested and normal without evidence of ataxia, dysmetria or tremor.  Gait and station: Patient walks no longer with a walker -  she is unable to stand erect, needs either 2 people to assist her with gait.   DTR traces in both patellae, none in achilles tendon, could only get a trace of biceps.    she has some tremor in the right an left hand.     Deep tendon reflexes: in the upper and lower extremities are Brisk , symmetric and intact.  No clonus.   ASSESSMENT AND PLAN:   Happy Birthday !   89 y.o. year old female  patient of dr Saint Martin here with:  LGMD2I is an autosomal recessive form of limb girdle muscular dystrophy (LGMD). It is one of the most common forms of LGMD, especially in [place]. The age of onset of muscle weakness is extremely variable, the most common being between 51 and 40 years of age. It can also range between two and 40  years.    1)  LIMB Girdle Muscular dystrophy. First manifested as penguin gait", as aptly named by her then 83 year old grandson in  2006. Now scoliosis/ stenosis, lack of paraspinal muscles for stabilization.Catherine Harding to ambulate even with assistance . Wheelchair bound.   2)  Intact Sensation.  Traces of DTR.   3) tremor in both hands, no rigor,  age -related. Good grip strength. Not parkinsonian.    Take a multivitamin- it will not hurt you.    I plan to follow up either personally or through our NP within 12 months.   I would like to thank Adrian Prince, MD and Adrian Prince, Md [address] [place],  [place] [address] for allowing me to meet with and to take care of this pleasant patient.    After spending a total time of  45  minutes face to face and additional time for physical and neurologic examination, review of laboratory studies,  personal review of imaging studies, reports and results of other testing and review of referral information / records as far as provided in visit,   Electronically signed by: Melvyn Novas, MD 09/21/2023 3:10 PM  [facility] Board certified by The [employer] of Sleep Medicine and Diplomate of the [employer] of Sleep Medicine. Board certified In Neurology through the ABPN, Fellow of the [employer] of Neurology.

## 2023-09-30 ENCOUNTER — Other Ambulatory Visit (HOSPITAL_COMMUNITY): Payer: Self-pay | Admitting: *Deleted

## 2023-10-03 ENCOUNTER — Ambulatory Visit (HOSPITAL_COMMUNITY)
Admission: RE | Admit: 2023-10-03 | Discharge: 2023-10-03 | Disposition: A | Discharge status: Home / Self Care | Payer: PPO | Source: Ambulatory Visit | Attending: Endocrinology | Admitting: Endocrinology

## 2023-10-03 DIAGNOSIS — M810 Age-related osteoporosis without current pathological fracture: Secondary | ICD-10-CM | POA: Insufficient documentation

## 2023-10-03 DIAGNOSIS — Z79620 Long term (current) use of immunosuppressive biologic: Secondary | ICD-10-CM | POA: Diagnosis not present

## 2023-10-03 MED ORDER — DENOSUMAB 60 MG/ML ~~LOC~~ SOSY
PREFILLED_SYRINGE | SUBCUTANEOUS | Status: AC
  Administered 2023-10-03: 60 mg via SUBCUTANEOUS
  Filled 2023-10-03: qty 1

## 2023-10-03 MED ORDER — DENOSUMAB 60 MG/ML ~~LOC~~ SOSY: 60.0000 mg | PREFILLED_SYRINGE | Freq: Once | SUBCUTANEOUS | Status: AC

## 2023-10-20 DIAGNOSIS — H353221 Exudative age-related macular degeneration, left eye, with active choroidal neovascularization: Secondary | ICD-10-CM | POA: Diagnosis not present

## 2023-12-08 ENCOUNTER — Other Ambulatory Visit: Payer: Self-pay | Admitting: Cardiology

## 2023-12-08 DIAGNOSIS — R000 Tachycardia, unspecified: Secondary | ICD-10-CM

## 2023-12-08 DIAGNOSIS — I491 Atrial premature depolarization: Secondary | ICD-10-CM

## 2023-12-29 DIAGNOSIS — I2510 Atherosclerotic heart disease of native coronary artery without angina pectoris: Secondary | ICD-10-CM | POA: Diagnosis not present

## 2023-12-29 DIAGNOSIS — E785 Hyperlipidemia, unspecified: Secondary | ICD-10-CM | POA: Diagnosis not present

## 2023-12-29 DIAGNOSIS — N1831 Chronic kidney disease, stage 3a: Secondary | ICD-10-CM | POA: Diagnosis not present

## 2023-12-29 DIAGNOSIS — D649 Anemia, unspecified: Secondary | ICD-10-CM | POA: Diagnosis not present

## 2023-12-29 DIAGNOSIS — I129 Hypertensive chronic kidney disease with stage 1 through stage 4 chronic kidney disease, or unspecified chronic kidney disease: Secondary | ICD-10-CM | POA: Diagnosis not present

## 2023-12-29 DIAGNOSIS — M810 Age-related osteoporosis without current pathological fracture: Secondary | ICD-10-CM | POA: Diagnosis not present

## 2023-12-29 DIAGNOSIS — R7301 Impaired fasting glucose: Secondary | ICD-10-CM | POA: Diagnosis not present

## 2024-01-05 ENCOUNTER — Ambulatory Visit: Payer: PPO | Admitting: Neurology

## 2024-01-05 DIAGNOSIS — Z1339 Encounter for screening examination for other mental health and behavioral disorders: Secondary | ICD-10-CM | POA: Diagnosis not present

## 2024-01-05 DIAGNOSIS — R82998 Other abnormal findings in urine: Secondary | ICD-10-CM | POA: Diagnosis not present

## 2024-01-05 DIAGNOSIS — I129 Hypertensive chronic kidney disease with stage 1 through stage 4 chronic kidney disease, or unspecified chronic kidney disease: Secondary | ICD-10-CM | POA: Diagnosis not present

## 2024-01-05 DIAGNOSIS — N1831 Chronic kidney disease, stage 3a: Secondary | ICD-10-CM | POA: Diagnosis not present

## 2024-01-05 DIAGNOSIS — E785 Hyperlipidemia, unspecified: Secondary | ICD-10-CM | POA: Diagnosis not present

## 2024-01-05 DIAGNOSIS — I2510 Atherosclerotic heart disease of native coronary artery without angina pectoris: Secondary | ICD-10-CM | POA: Diagnosis not present

## 2024-01-05 DIAGNOSIS — Z0000 Encounter for general adult medical examination without abnormal findings: Secondary | ICD-10-CM | POA: Diagnosis not present

## 2024-01-05 DIAGNOSIS — H3530 Unspecified macular degeneration: Secondary | ICD-10-CM | POA: Diagnosis not present

## 2024-01-05 DIAGNOSIS — I872 Venous insufficiency (chronic) (peripheral): Secondary | ICD-10-CM | POA: Diagnosis not present

## 2024-01-05 DIAGNOSIS — G7109 Other specified muscular dystrophies: Secondary | ICD-10-CM | POA: Diagnosis not present

## 2024-01-05 DIAGNOSIS — E119 Type 2 diabetes mellitus without complications: Secondary | ICD-10-CM | POA: Diagnosis not present

## 2024-01-05 DIAGNOSIS — I10 Essential (primary) hypertension: Secondary | ICD-10-CM | POA: Diagnosis not present

## 2024-01-05 DIAGNOSIS — I491 Atrial premature depolarization: Secondary | ICD-10-CM | POA: Diagnosis not present

## 2024-01-05 DIAGNOSIS — Z1331 Encounter for screening for depression: Secondary | ICD-10-CM | POA: Diagnosis not present

## 2024-01-10 DIAGNOSIS — H353221 Exudative age-related macular degeneration, left eye, with active choroidal neovascularization: Secondary | ICD-10-CM | POA: Diagnosis not present

## 2024-01-10 DIAGNOSIS — H35361 Drusen (degenerative) of macula, right eye: Secondary | ICD-10-CM | POA: Diagnosis not present

## 2024-01-10 DIAGNOSIS — H353112 Nonexudative age-related macular degeneration, right eye, intermediate dry stage: Secondary | ICD-10-CM | POA: Diagnosis not present

## 2024-01-10 DIAGNOSIS — H53411 Scotoma involving central area, right eye: Secondary | ICD-10-CM | POA: Diagnosis not present

## 2024-01-10 DIAGNOSIS — H35341 Macular cyst, hole, or pseudohole, right eye: Secondary | ICD-10-CM | POA: Diagnosis not present

## 2024-01-10 DIAGNOSIS — H35371 Puckering of macula, right eye: Secondary | ICD-10-CM | POA: Diagnosis not present

## 2024-02-27 DIAGNOSIS — I129 Hypertensive chronic kidney disease with stage 1 through stage 4 chronic kidney disease, or unspecified chronic kidney disease: Secondary | ICD-10-CM | POA: Diagnosis not present

## 2024-02-27 DIAGNOSIS — N1831 Chronic kidney disease, stage 3a: Secondary | ICD-10-CM | POA: Diagnosis not present

## 2024-02-28 ENCOUNTER — Ambulatory Visit (INDEPENDENT_AMBULATORY_CARE_PROVIDER_SITE_OTHER): Admitting: Neurology

## 2024-02-28 ENCOUNTER — Encounter: Payer: Self-pay | Admitting: Neurology

## 2024-02-28 VITALS — BP 138/72 | HR 76 | Ht 62.0 in | Wt 151.0 lb

## 2024-02-28 DIAGNOSIS — I471 Supraventricular tachycardia, unspecified: Secondary | ICD-10-CM

## 2024-02-28 DIAGNOSIS — K582 Mixed irritable bowel syndrome: Secondary | ICD-10-CM | POA: Diagnosis not present

## 2024-02-28 DIAGNOSIS — G71039 Limb girdle muscular dystrophy, unspecified: Secondary | ICD-10-CM | POA: Diagnosis not present

## 2024-02-28 DIAGNOSIS — M4120 Other idiopathic scoliosis, site unspecified: Secondary | ICD-10-CM | POA: Diagnosis not present

## 2024-02-28 NOTE — Patient Instructions (Signed)
 Muscular Dystrophy  Muscular dystrophy is any condition in a group of diseases that involve muscle weakness and muscle loss. This can occur in the muscles used for body movements (voluntary muscles) or in the muscles used for breathing and heartbeat (involuntary muscles). Most types of muscular dystrophy begin to cause symptoms during childhood. Some types do not cause symptoms until later in life. The most common types of muscular dystrophy are: Duchenne. This is the most common type. Symptoms usually begin by age 96 and start in the upper arms and legs. Donalee Citrin. This type is usually milder than Duchenne. Symptoms usually begin by age 3 and start in the arms and legs. Myotonic. This is the most common type that affects adults. Symptoms usually begin by age 968. This type makes muscles tight and unable to relax. It usually starts in the arms, face, hands, or legs. Limb-girdle. Weakness may begin in childhood or adult years. This type starts in the hips and shoulders. Facioscapulohumeral. This type affects the face and shoulders. Symptoms may start in childhood or adult years. Congenital. This type is present at birth. It causes muscle weakness in the upper body and legs. What are the causes? This condition is caused by changes (mutations) in the genes. Those genes are needed to make proteins for healthy muscles. Different mutations cause different types of muscular dystrophy. These gene mutations may: Be present at birth (congenital). The mutation is passed from parent to child (inherited). Develop on their own (spontaneous). The mutation occurs when the baby is growing in the womb, but the mutation was not passed from mother to child. What increases the risk? This condition is more likely to develop in: People who have a family history of the condition. Males. The most common type of muscular dystrophy occurs more often in boys than in girls. What are the signs or symptoms? Symptoms of this  condition vary, depending on the type of muscular dystrophy that you have. Early signs and symptoms of muscular dystrophy in childhood may include: Frequent falling or clumsiness. Having trouble getting into a standing position. Trouble running, walking, jumping, or climbing stairs. A waddling walk. Walking on toes. Painful or stiff muscles. Symptoms of more advanced muscular dystrophy may include: Becoming unable to walk. Trouble pushing things like a wagon or tricycle. Curving of the spine. Trouble swallowing. Trouble breathing. Heart failure. How is this diagnosed? This condition may be diagnosed with: A physical exam. Your medical history. Blood tests. Tests to measure the electrical activity of muscles (electromyogram). A procedure to remove a sample of muscle fibers to be examined under a microscope (muscle biopsy). Heart and lung function tests. How is this treated? There is no cure for this condition. However, treatment may slow worsening of the disease and help you to stay active. Treatment may include: Occupational, physical, and speech therapy. Assistive devices, such as a brace, cane, walker, or wheelchair. Medicines to: Reduce swelling (steroids). Improve heart function and bone health. Surgery to correct or prevent complications, such as eye or heart problems. Counseling to support mental health. A feeding tube to assist with nutrition. A breathing machine (ventilator). Follow these instructions at home: Eating and drinking  Eat a healthy, balanced diet. Do not try to treat this condition with special diets or supplements. These do not help to relieve the symptoms of this condition. Activity Try to stay physically active. Ask your health care provider what activities are safe for you. Do exercises as told by your health care provider. Safety  Take  steps to prevent falls. If possible: Remodel a bathroom to make it accessible for a wheelchair. Add a chair or  bench to the shower. Install grab bars for your tub, shower, and toilet. Lower beds to make getting in and out easier. Install assistive devices throughout your home. This will help when you are going from sitting to standing. If necessary, make changes in your home to improve access: Widen doors and install a wheelchair ramp. Replace doorknobs with lever handles. This will make it easier to open doors. Install pull-out shelving in cabinets. Place items such as plates and cups in low drawers. Consider controls that allow you to turn off lights if you are seated or have left a room. General instructions  Learn as much as you can about muscular dystrophy. Work closely with your treatment team. Home care needs may change over time. Take over-the-counter and prescription medicines only as told by your health care provider. Stay up to date on all immunizations, including the yearly (annual) flu (influenza) vaccine. Try to maintain a healthy weight. You may need to follow a low-calorie eating plan to maintain your weight if you cannot be active. Do not use any products that contain nicotine or tobacco. These products include cigarettes, chewing tobacco, and vaping devices, such as e-cigarettes. If you need help quitting, ask your health care provider. Keep all follow-up visits. This is important. Where to find more information Ask your health care provider to suggest resources in your area for emotional or caregiving support. Muscular Dystrophy Association: [employer]Sheet[employer] Contact a health care provider if: Your symptoms change. You have trouble taking care of yourself at home. Get help right away if: You have problems with breathing or swallowing. It is no longer safe or possible for you to care for yourself at home. These symptoms may be an emergency. Get help right away. Call 911. Do not wait to see if the symptoms will go away. Do not drive yourself to the hospital. Summary Muscular  dystrophy is any condition in a group of diseases that involve muscle weakness and muscle loss. Symptoms of this condition vary, depending on the type of muscular dystrophy. There is no cure for muscular dystrophy. However, treatment may slow the worsening of the disease. You may need to make changes in your home to make it safer and easier to care for yourself. This information is not intended to replace advice given to you by your health care provider. Make sure you discuss any questions you have with your health care provider. Document Revised: 08/25/2021 Document Reviewed: 08/25/2021 Elsevier Patient Education  2024 [employer].

## 2024-02-28 NOTE — Progress Notes (Signed)
 Provider:  Neomia Banner, MD  Primary Care Physician:  Rosslyn Coons, MD [address] [place] [address] [place] [address]     Referring Provider: Rosslyn Coons, Md [address] [place],  [place] [address]          Chief Complaint according to patient   Patient presents with:          States overall everything has been stable with the exception of being able to mobilize.  She was using a walker and was no longer able to safely use it, advanced to a wheelchair.  She has a caretaker 5 days out of the week. She was getting patient care through a company [employer] and when the originally evaluated her they recommended that she have 24 hour care. The nurse that came out and eval only assessed the need for 8 hrs a day and the pt is very concerned about the restriction based on her true need.          HISTORY OF PRESENT ILLNESS:  Catherine Harding is a 89 y.o. female patient who is here for revisit 02/28/2024 for  LG-Muscular Dystrophy.  She has secondary scoliosis and has pain. She has IBS and is concerned it may be Crohn's disease.  .  Chief concern according to patient :  " I have not been able to shower or bathe alone any longer, I need rectal stimulation to empty my bowels and I then my legs are swelling. I need hearing aids seen and my vision has declined.  I have macular degeneration.  I need a magnifying glass to read.  I have foot pain. Need help with compression stockings  in AM and PM.  She  has to transfer cooking ingredients on a tray on her wheelchair from the fridge to the stove. She has a small stoop at the entrance of the home and 2 stairs to the garage- these are difficult to handle. Her neighbors have volunteered to drive when needed.    Her daughter is also  sickly now and she has alzheimer's early age onset- gets  infusion treatments at [facility].       Review of Systems: Out of a complete 14 system review, the patient complains of only the following symptoms, and  all other reviewed systems are negative.:   See above .   Social History   Socioeconomic History   Marital status: Divorced    Spouse name: Not on file   Number of children: 3   Years of education: 13   Highest education level: Not on file  Occupational History   Occupation: retired    Comment: Haematologist at [employer]  Tobacco Use   Smoking status: Never   Smokeless tobacco: Never  Vaping Use   Vaping status: Never Used  Substance and Sexual Activity   Alcohol  use: No    Alcohol /week: 0.0 standard drinks of alcohol     Comment: rare   Drug use: No   Sexual activity: Never    Birth control/protection: Surgical  Other Topics Concern   Not on file  Social History Narrative   Patient lives at home alone and she is divorced. Patient is retired.   Right handed.   Caffeine- one cup daily.   Uses walker.   Grown children, live 7 miles away.     Social Drivers of Corporate investment banker Strain: Not on file  Food Insecurity: No Food Insecurity (06/09/2023)   Hunger Vital  Sign    Worried About Programme researcher, broadcasting/film/video in the Last Year: Never true    Ran Out of Food in the Last Year: Never true  Transportation Needs: No Transportation Needs (06/09/2023)   PRAPARE - Administrator, Civil Service (Medical): No    Lack of Transportation (Non-Medical): No  Physical Activity: Not on file  Stress: Not on file  Social Connections: Not on file    Family History  Problem Relation Age of Onset   Hypertension Sister     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   Macular hole of left eye    Muscular dystrophy (HCC)    Osteoporosis     pelvic fracture   Pseudocholinesterase deficiency    Scoliosis     Past Surgical History:  Procedure Laterality Date   ABDOMINAL HYSTERECTOMY     TAH BSO   BREAST LUMPECTOMY  1971   benign   CESAREAN SECTION     x2   COLPOSCOPY     EYE SURGERY  2003   left   TOTAL ABDOMINAL HYSTERECTOMY W/ BILATERAL SALPINGOOPHORECTOMY  1981   TRIGGER FINGER RELEASE  07/28/2012   Procedure: RELEASE TRIGGER FINGER/A-1 PULLEY;  Surgeon: Amelie Baize., MD;  Location: [facility];  Service: Orthopedics;  Laterality: Right;  EXCISION CYST RIGHT LONG A-1 RELEASE A-1 RIGHT LONG      Current Outpatient Medications on File Prior to Visit  Medication Sig Dispense Refill   albuterol  (PROVENTIL ) (2.5 MG/3ML) 0.083% nebulizer solution Take 3 mLs (2.5 mg total) by nebulization every 2 (two) hours as needed for wheezing.     Artificial Tear Solution (SYSTANE CONTACTS) SOLN Apply to eye.     Calcium Carbonate Antacid (TUMS PO) Take by mouth daily as needed. chewable     Calcium Carbonate-Vit D-Min (CALTRATE PLUS PO) Take by mouth.       co-enzyme Q-10 30 MG capsule Take 1 capsule (30 mg total) by mouth 3 (three) times daily. 90 capsule 5   denosumab  (PROLIA ) 60 MG/ML SOLN injection Inject 60 mg into the skin every 6 (six) months. Administer in upper arm, thigh, or abdomen     diltiazem  (CARDIZEM  CD) 240 MG 24 hr capsule Take 240 mg by mouth daily.       ergocalciferol  (VITAMIN D2) 50000 UNITS capsule Take 50,000 Units by mouth 2 (two) times a week.      fluticasone (FLONASE) 50 MCG/ACT nasal spray Place 1-2 sprays into both nostrils daily.     furosemide  (LASIX ) 20 MG tablet Take 1 tablet (20 mg total) by mouth 2 (two) times daily.     hyoscyamine  (LEVSIN SL) 0.125 MG SL tablet Place 1 tablet (0.125 mg total) under the tongue every 6 (six) hours as needed for cramping.     JANUVIA 100 MG tablet Take 100 mg by mouth daily.     loperamide  (IMODIUM ) 2 MG capsule Take 1 capsule (2 mg total) by  mouth 4 (four) times daily as needed for diarrhea or loose stools.     losartan (COZAAR) 100 MG tablet Take 100  mg by mouth daily.     metoprolol  succinate (TOPROL -XL) 25 MG 24 hr tablet TAKE 1 TABLET (25 MG TOTAL) BY MOUTH DAILY. 90 tablet 2   montelukast (SINGULAIR) 10 MG tablet Take 10 mg by mouth daily.     Multiple Vitamins-Minerals (PRESERVISION/LUTEIN) CAPS Take 2 capsules by mouth daily.     Omega-3 Fatty Acids (FISH OIL ) 1200 MG CAPS Take 2 capsules (2,400 mg total) by mouth daily. 30 capsule 5   ondansetron  (ZOFRAN ) 4 MG tablet Take 1 tablet (4 mg total) by mouth every 6 (six) hours as needed for nausea.     pantoprazole (PROTONIX) 20 MG tablet Take 20 mg by mouth 2 (two) times daily.     Probiotic Product (PROBIOTIC PO) Take 1 Dose by mouth daily.     potassium chloride  SA (KLOR-CON  M) 20 MEQ tablet Take 2 tablets (40 mEq total) by mouth daily for 5 days.     No current facility-administered medications on file prior to visit.    Allergies  Allergen Reactions   Anesthetics, Amide    Ciprofloxacin Swelling   Celebrex [Celecoxib]    Sulfa Antibiotics    Codeine Nausea And Vomiting   Morphine And Codeine Nausea And Vomiting     DIAGNOSTIC DATA (LABS, IMAGING, TESTING) - I reviewed patient records, labs, notes, testing and imaging myself where available.  Lab Results  Component Value Date   WBC 6.0 06/13/2023   HGB 10.8 (L) 06/13/2023   HCT 33.0 (L) 06/13/2023   MCV 98.2 06/13/2023   PLT 181 06/13/2023      Component Value Date/Time   NA 138 06/14/2023 0615   NA 139 03/16/2023 1340   K 3.2 (L) 06/14/2023 0615   CL 105 06/14/2023 0615   CO2 24 06/14/2023 0615   GLUCOSE 166 (H) 06/14/2023 0615   BUN 9 06/14/2023 0615   BUN 16 03/16/2023 1340   CREATININE 0.75 06/14/2023 0615   CREATININE 0.76 08/28/2012 1249   CALCIUM 8.6 (L) 06/14/2023 0615   CALCIUM 10.1 08/28/2012 1249   PROT 5.6 (L) 06/14/2023 0615   PROT 6.6 03/16/2023 1340   ALBUMIN 2.6 (L) 06/14/2023  0615   ALBUMIN 4.1 03/16/2023 1340   AST 38 06/14/2023 0615   ALT 88 (H) 06/14/2023 0615   ALKPHOS 122 06/14/2023 0615   BILITOT 1.1 06/14/2023 0615   BILITOT 0.6 03/16/2023 1340   GFRNONAA >60 06/14/2023 0615   GFRAA >90 07/27/2012 1100   No results found for: "CHOL", "HDL", "LDLCALC", "LDLDIRECT", "TRIG", "CHOLHDL" No results found for: "HGBA1C" No results found for: "VITAMINB12" No results found for: "TSH"  PHYSICAL EXAM:  Today's Vitals   02/28/24 1053  BP: 138/72  Pulse: 76  Weight: 151 lb (68.5 kg)  Height: 5\' 2"  (1.575 m)   Body mass index is 27.62 kg/m.   Wt Readings from Last 3 Encounters:  02/28/24 151 lb (68.5 kg)  09/21/23 150 lb (68 kg)  09/12/23 143 lb 12.8 oz (65.2 kg)     Ht Readings from Last 3 Encounters:  02/28/24 5\' 2"  (1.575 m)  09/21/23 5\' 1"  (1.549 m)  09/12/23 5\' 1"  (1.549 m)      General: The patient is awake, alert and appears not in acute distress. The patient is well groomed. Head: Normocephalic, atraumatic. Neck is supple.  Stooped . Cardiovascular:  Regular rate and cardiac rhythm by pulse, without distended neck veins. Respiratory: Lungs are clear to auscultation.   She has scoliosis.  Skin:  With evidence of  ankle edema,. Trunk: scoliosis    NEUROLOGIC EXAM: The patient is awake and alert, oriented to place and time.   Memory subjective described as intact.  Attention span & concentration ability appears normal.  Speech is fluent,  but there is some shortness of breath. Mood and affect are appropriate.   Cranial nerves: no loss of smell or taste reported  Pupils are equal and briskly reactive to light. Funduscopic exam .  Extraocular movements in vertical and horizontal planes were intact and without nystagmus. No Diplopia. Visual fields by finger perimetry are intact. Hearing impaired. Facial sensation intact to fine touch.  Facial motor strength is symmetric and tongue and uvula move midline.  Neck ROM : rotation, tilt and  flexion extension were normal for age and shoulder shrug was symmetrical.    Motor exam:  Symmetric bulk, tone and ROM.   Normal tone without cog -wheeling,  symmetric grip strength .   Sensory:  Fine touch, and vibration are felt on both knees a but not in the ankles- swelling related?  -  Proprioception tested in the upper extremities was normal.   Coordination: Rapid alternating movements in the fingers/hands were of normal speed.  The Finger-to-nose maneuver was intact without evidence of ataxia, dysmetria or tremor.   Gait and station: wheelchair.     ASSESSMENT AND PLAN:  89 y.o. 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  Scoliosis,  but cognitively in excellent shape.  Needing ADL assistance based on her physical impairment, and vision and hearing impairment play an additional role.    I had prepared a letter for her LONG TERM care needs to document the need to change to 24 hours.    I have been delighted to see Mrs Kuna again  and will follow her in 6-9 months.   Rv in 9 months with me .      I would like to thank Rosslyn Coons, MD and Rosslyn Coons, Md [address] [place],  [place] [address] for allowing me to meet with and to take care of this pleasant patient.     After spending a total time of  30  minutes face to face and additional time for physical and neurologic examination, review of laboratory studies,  personal review of imaging studies, reports and results of other testing and review of referral information / records as far as provided in visit,   Electronically signed by: Neomia Banner, MD 02/28/2024 11:14 AM  [facility] Board certified by The [employer] of Sleep Medicine and Diplomate of the [employer] of Sleep Medicine. Board certified In Neurology through the ABPN, Fellow of the [employer] of Neu[employer].

## 2024-03-26 ENCOUNTER — Telehealth: Payer: Self-pay | Admitting: Pharmacy Technician

## 2024-03-26 NOTE — Telephone Encounter (Signed)
 Auth Submission: NO AUTH NEEDED Site of care: Site of care: [facility] Payer: [employer] ADVT Medication & CPT/J Code(s) submitted: Prolia  (Denosumab ) [id number] Diagnosis Code:  Route of submission (phone, fax, portal):  Phone # Fax # Auth type: Buy/Bill HB Units/visits requested: 60MG  Q6M X2 DOSES Reference number:  Approval from: 03/26/24 to 10/10/24

## 2024-03-30 ENCOUNTER — Other Ambulatory Visit (HOSPITAL_COMMUNITY): Payer: Self-pay | Admitting: *Deleted

## 2024-04-03 ENCOUNTER — Ambulatory Visit (HOSPITAL_COMMUNITY)
Admission: RE | Admit: 2024-04-03 | Discharge: 2024-04-03 | Disposition: A | Discharge status: Home / Self Care | Source: Ambulatory Visit | Attending: Endocrinology | Admitting: Endocrinology

## 2024-04-03 DIAGNOSIS — N1831 Chronic kidney disease, stage 3a: Secondary | ICD-10-CM | POA: Diagnosis not present

## 2024-04-03 DIAGNOSIS — K5790 Diverticulosis of intestine, part unspecified, without perforation or abscess without bleeding: Secondary | ICD-10-CM | POA: Diagnosis not present

## 2024-04-03 DIAGNOSIS — R509 Fever, unspecified: Secondary | ICD-10-CM | POA: Diagnosis not present

## 2024-04-03 DIAGNOSIS — K580 Irritable bowel syndrome with diarrhea: Secondary | ICD-10-CM | POA: Diagnosis not present

## 2024-04-03 DIAGNOSIS — R197 Diarrhea, unspecified: Secondary | ICD-10-CM | POA: Diagnosis not present

## 2024-04-03 DIAGNOSIS — I10 Essential (primary) hypertension: Secondary | ICD-10-CM | POA: Diagnosis not present

## 2024-04-03 DIAGNOSIS — Z1152 Encounter for screening for COVID-19: Secondary | ICD-10-CM | POA: Diagnosis not present

## 2024-04-03 DIAGNOSIS — R6883 Chills (without fever): Secondary | ICD-10-CM | POA: Diagnosis not present

## 2024-04-03 DIAGNOSIS — M810 Age-related osteoporosis without current pathological fracture: Secondary | ICD-10-CM | POA: Diagnosis not present

## 2024-04-03 DIAGNOSIS — K5792 Diverticulitis of intestine, part unspecified, without perforation or abscess without bleeding: Secondary | ICD-10-CM | POA: Diagnosis not present

## 2024-04-03 DIAGNOSIS — I129 Hypertensive chronic kidney disease with stage 1 through stage 4 chronic kidney disease, or unspecified chronic kidney disease: Secondary | ICD-10-CM | POA: Diagnosis not present

## 2024-04-03 DIAGNOSIS — H3530 Unspecified macular degeneration: Secondary | ICD-10-CM | POA: Diagnosis not present

## 2024-04-03 DIAGNOSIS — R600 Localized edema: Secondary | ICD-10-CM | POA: Diagnosis not present

## 2024-04-03 MED ORDER — DENOSUMAB 60 MG/ML ~~LOC~~ SOSY
60.0000 mg | PREFILLED_SYRINGE | Freq: Once | SUBCUTANEOUS | Status: AC
  Administered 2024-04-03: 60 mg via SUBCUTANEOUS

## 2024-04-03 MED ORDER — DENOSUMAB 60 MG/ML ~~LOC~~ SOSY
PREFILLED_SYRINGE | SUBCUTANEOUS | Status: AC
  Filled 2024-04-03: qty 1

## 2024-04-05 DIAGNOSIS — H353221 Exudative age-related macular degeneration, left eye, with active choroidal neovascularization: Secondary | ICD-10-CM | POA: Diagnosis not present

## 2024-04-23 ENCOUNTER — Other Ambulatory Visit: Payer: Self-pay

## 2024-04-23 MED ORDER — PANTOPRAZOLE SODIUM 20 MG PO TBEC: 20.0000 mg | DELAYED_RELEASE_TABLET | Freq: Two times a day (BID) | ORAL | 1 refills | Status: DC

## 2024-04-23 NOTE — Telephone Encounter (Signed)
 I have schedule a f/u appt with you for September, pt would like to know if you could refill the pantoprazole  in the meantime.... Please advise

## 2024-05-17 ENCOUNTER — Other Ambulatory Visit: Payer: Self-pay | Admitting: Physician Assistant

## 2024-05-24 DIAGNOSIS — M25561 Pain in right knee: Secondary | ICD-10-CM | POA: Diagnosis not present

## 2024-06-19 ENCOUNTER — Telehealth: Payer: Self-pay | Admitting: Physician Assistant

## 2024-06-19 ENCOUNTER — Ambulatory Visit: Admitting: Physician Assistant

## 2024-06-19 ENCOUNTER — Telehealth: Payer: Self-pay | Admitting: Neurology

## 2024-06-19 DIAGNOSIS — G71039 Limb girdle muscular dystrophy, unspecified: Secondary | ICD-10-CM

## 2024-06-19 DIAGNOSIS — M4120 Other idiopathic scoliosis, site unspecified: Secondary | ICD-10-CM

## 2024-06-19 NOTE — Telephone Encounter (Signed)
 Pt called to request referral for  Home health

## 2024-06-19 NOTE — Telephone Encounter (Signed)
 Spoke w/Pt who stated she needs therapy for walking and for her arm strength and Dr. Chalice has ordered PT for her in the past. Informed Pt will place the order for PT and OT. Pt voiced understanding and thanks.

## 2024-06-19 NOTE — Telephone Encounter (Signed)
 Patient returning call from linda? Please review and advise  Thank you

## 2024-06-19 NOTE — Telephone Encounter (Signed)
 Looks like patient was returning a call to Catherine Harding, [facility].

## 2024-06-19 NOTE — Telephone Encounter (Signed)
 I took care of her.  She just needed to reschedule the appointment she made yesterday.  Thank you.

## 2024-06-20 NOTE — Telephone Encounter (Signed)
 Spoke w/Pt regarding HH PT & OT, informed Pt needs a F2F visit within 90 days for them to accept the referral. Discussed her recent visit with ortho and they could possibly order PT or her PCP since she will likely see them before her next visit to [facility]. Pt voiced understanding and thanks for the call.

## 2024-06-20 NOTE — Telephone Encounter (Signed)
 [facility] can take her but she will need an office or video visit first-it has to be within 90 days.

## 2024-07-05 DIAGNOSIS — H353221 Exudative age-related macular degeneration, left eye, with active choroidal neovascularization: Secondary | ICD-10-CM | POA: Diagnosis not present

## 2024-07-26 DIAGNOSIS — D2261 Melanocytic nevi of right upper limb, including shoulder: Secondary | ICD-10-CM | POA: Diagnosis not present

## 2024-07-26 DIAGNOSIS — Z85828 Personal history of other malignant neoplasm of skin: Secondary | ICD-10-CM | POA: Diagnosis not present

## 2024-07-26 DIAGNOSIS — D2271 Melanocytic nevi of right lower limb, including hip: Secondary | ICD-10-CM | POA: Diagnosis not present

## 2024-07-26 DIAGNOSIS — D2371 Other benign neoplasm of skin of right lower limb, including hip: Secondary | ICD-10-CM | POA: Diagnosis not present

## 2024-07-26 DIAGNOSIS — D2262 Melanocytic nevi of left upper limb, including shoulder: Secondary | ICD-10-CM | POA: Diagnosis not present

## 2024-07-26 DIAGNOSIS — D2272 Melanocytic nevi of left lower limb, including hip: Secondary | ICD-10-CM | POA: Diagnosis not present

## 2024-07-26 DIAGNOSIS — Z23 Encounter for immunization: Secondary | ICD-10-CM | POA: Diagnosis not present

## 2024-07-26 DIAGNOSIS — D225 Melanocytic nevi of trunk: Secondary | ICD-10-CM | POA: Diagnosis not present

## 2024-07-26 DIAGNOSIS — L821 Other seborrheic keratosis: Secondary | ICD-10-CM | POA: Diagnosis not present

## 2024-07-26 DIAGNOSIS — L2989 Other pruritus: Secondary | ICD-10-CM | POA: Diagnosis not present

## 2024-07-26 DIAGNOSIS — L820 Inflamed seborrheic keratosis: Secondary | ICD-10-CM | POA: Diagnosis not present

## 2024-07-26 DIAGNOSIS — L570 Actinic keratosis: Secondary | ICD-10-CM | POA: Diagnosis not present

## 2024-07-31 DIAGNOSIS — I129 Hypertensive chronic kidney disease with stage 1 through stage 4 chronic kidney disease, or unspecified chronic kidney disease: Secondary | ICD-10-CM | POA: Diagnosis not present

## 2024-07-31 DIAGNOSIS — E119 Type 2 diabetes mellitus without complications: Secondary | ICD-10-CM | POA: Diagnosis not present

## 2024-07-31 DIAGNOSIS — G7109 Other specified muscular dystrophies: Secondary | ICD-10-CM | POA: Diagnosis not present

## 2024-07-31 DIAGNOSIS — R600 Localized edema: Secondary | ICD-10-CM | POA: Diagnosis not present

## 2024-07-31 DIAGNOSIS — N1831 Chronic kidney disease, stage 3a: Secondary | ICD-10-CM | POA: Diagnosis not present

## 2024-07-31 DIAGNOSIS — M810 Age-related osteoporosis without current pathological fracture: Secondary | ICD-10-CM | POA: Diagnosis not present

## 2024-07-31 DIAGNOSIS — G629 Polyneuropathy, unspecified: Secondary | ICD-10-CM | POA: Diagnosis not present

## 2024-07-31 DIAGNOSIS — I2510 Atherosclerotic heart disease of native coronary artery without angina pectoris: Secondary | ICD-10-CM | POA: Diagnosis not present

## 2024-08-09 ENCOUNTER — Ambulatory Visit: Admitting: Physician Assistant

## 2024-08-09 DIAGNOSIS — H52201 Unspecified astigmatism, right eye: Secondary | ICD-10-CM | POA: Diagnosis not present

## 2024-08-09 DIAGNOSIS — Z961 Presence of intraocular lens: Secondary | ICD-10-CM | POA: Diagnosis not present

## 2024-08-10 DIAGNOSIS — Z87442 Personal history of urinary calculi: Secondary | ICD-10-CM | POA: Diagnosis not present

## 2024-08-10 DIAGNOSIS — Z9181 History of falling: Secondary | ICD-10-CM | POA: Diagnosis not present

## 2024-08-10 DIAGNOSIS — Z79899 Other long term (current) drug therapy: Secondary | ICD-10-CM | POA: Diagnosis not present

## 2024-08-10 DIAGNOSIS — Z7722 Contact with and (suspected) exposure to environmental tobacco smoke (acute) (chronic): Secondary | ICD-10-CM | POA: Diagnosis not present

## 2024-08-10 DIAGNOSIS — E1122 Type 2 diabetes mellitus with diabetic chronic kidney disease: Secondary | ICD-10-CM | POA: Diagnosis not present

## 2024-08-10 DIAGNOSIS — G71039 Limb girdle muscular dystrophy, unspecified: Secondary | ICD-10-CM | POA: Diagnosis not present

## 2024-08-10 DIAGNOSIS — N1831 Chronic kidney disease, stage 3a: Secondary | ICD-10-CM | POA: Diagnosis not present

## 2024-08-10 DIAGNOSIS — K219 Gastro-esophageal reflux disease without esophagitis: Secondary | ICD-10-CM | POA: Diagnosis not present

## 2024-08-10 DIAGNOSIS — I2510 Atherosclerotic heart disease of native coronary artery without angina pectoris: Secondary | ICD-10-CM | POA: Diagnosis not present

## 2024-08-10 DIAGNOSIS — K760 Fatty (change of) liver, not elsewhere classified: Secondary | ICD-10-CM | POA: Diagnosis not present

## 2024-08-10 DIAGNOSIS — E1142 Type 2 diabetes mellitus with diabetic polyneuropathy: Secondary | ICD-10-CM | POA: Diagnosis not present

## 2024-08-10 DIAGNOSIS — M810 Age-related osteoporosis without current pathological fracture: Secondary | ICD-10-CM | POA: Diagnosis not present

## 2024-08-10 DIAGNOSIS — E669 Obesity, unspecified: Secondary | ICD-10-CM | POA: Diagnosis not present

## 2024-08-10 DIAGNOSIS — R1312 Dysphagia, oropharyngeal phase: Secondary | ICD-10-CM | POA: Diagnosis not present

## 2024-08-10 DIAGNOSIS — E785 Hyperlipidemia, unspecified: Secondary | ICD-10-CM | POA: Diagnosis not present

## 2024-08-10 DIAGNOSIS — I129 Hypertensive chronic kidney disease with stage 1 through stage 4 chronic kidney disease, or unspecified chronic kidney disease: Secondary | ICD-10-CM | POA: Diagnosis not present

## 2024-08-10 DIAGNOSIS — Z6827 Body mass index (BMI) 27.0-27.9, adult: Secondary | ICD-10-CM | POA: Diagnosis not present

## 2024-08-10 DIAGNOSIS — Z7984 Long term (current) use of oral hypoglycemic drugs: Secondary | ICD-10-CM | POA: Diagnosis not present

## 2024-08-20 DIAGNOSIS — E669 Obesity, unspecified: Secondary | ICD-10-CM | POA: Diagnosis not present

## 2024-08-20 DIAGNOSIS — M810 Age-related osteoporosis without current pathological fracture: Secondary | ICD-10-CM | POA: Diagnosis not present

## 2024-08-20 DIAGNOSIS — K760 Fatty (change of) liver, not elsewhere classified: Secondary | ICD-10-CM | POA: Diagnosis not present

## 2024-08-20 DIAGNOSIS — N1831 Chronic kidney disease, stage 3a: Secondary | ICD-10-CM | POA: Diagnosis not present

## 2024-08-20 DIAGNOSIS — I2510 Atherosclerotic heart disease of native coronary artery without angina pectoris: Secondary | ICD-10-CM | POA: Diagnosis not present

## 2024-08-20 DIAGNOSIS — E1122 Type 2 diabetes mellitus with diabetic chronic kidney disease: Secondary | ICD-10-CM | POA: Diagnosis not present

## 2024-08-20 DIAGNOSIS — K219 Gastro-esophageal reflux disease without esophagitis: Secondary | ICD-10-CM | POA: Diagnosis not present

## 2024-08-20 DIAGNOSIS — I129 Hypertensive chronic kidney disease with stage 1 through stage 4 chronic kidney disease, or unspecified chronic kidney disease: Secondary | ICD-10-CM | POA: Diagnosis not present

## 2024-08-20 DIAGNOSIS — E1142 Type 2 diabetes mellitus with diabetic polyneuropathy: Secondary | ICD-10-CM | POA: Diagnosis not present

## 2024-08-20 DIAGNOSIS — Z6827 Body mass index (BMI) 27.0-27.9, adult: Secondary | ICD-10-CM | POA: Diagnosis not present

## 2024-08-20 DIAGNOSIS — E785 Hyperlipidemia, unspecified: Secondary | ICD-10-CM | POA: Diagnosis not present

## 2024-08-20 DIAGNOSIS — G71039 Limb girdle muscular dystrophy, unspecified: Secondary | ICD-10-CM | POA: Diagnosis not present

## 2024-08-21 ENCOUNTER — Ambulatory Visit: Admitting: Physician Assistant

## 2024-08-28 DIAGNOSIS — Z1231 Encounter for screening mammogram for malignant neoplasm of breast: Secondary | ICD-10-CM | POA: Diagnosis not present

## 2024-09-20 ENCOUNTER — Ambulatory Visit: Payer: PPO | Admitting: Neurology

## 2024-09-20 DIAGNOSIS — H353221 Exudative age-related macular degeneration, left eye, with active choroidal neovascularization: Secondary | ICD-10-CM | POA: Diagnosis not present

## 2024-09-26 NOTE — Progress Notes (Unsigned)
 Catherine Console, PA-C [address] [place], [place]  [address] Phone: [phone]   Gastroenterology Consultation  Referring Provider:     Nichole Senior, MD Primary Care Physician:  Nichole Senior, MD Primary Gastroenterologist:  Catherine Console, PA-C / Dr. Gordy Starch  Reason for Consultation:     Follow-up IBS-D and GERD        HPI:   Discussed the use of AI scribe software for clinical note transcription with the patient, who gave verbal consent to proceed.  She is here with her friend who is driving her.  Patient is in a wheelchair.  89 year old female, whom I last saw her at [facility] 03/2023 for diverticulitis, presents for follow-up IBS-D and GERD.  Takes hyoscyamine  0.125 mg once daily as needed, Imodium  as needed, Gas-X, align probiotic, and pantoprazole  20 mg twice daily.  She needs medication refills.  She has never had any adverse side effects of taking hyoscyamine .  He has been on it for many years and only takes it sparingly as needed for lower abdominal cramping.  It has been very effective.  She has history of irritable bowel syndrome, diarrhea predominant.  History of diverticulitis and cholelithiasis.  History of pandiverticulosis.  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  History of pseudocholinesterase deficiency.  She was admitted to [facility] 06/08/2023 until 06/14/2023 for elevated LFTs, RUQ pain, and cholelithiasis.  MRCP showed common bile duct 13 mm.  Treated with antibiotics.  GI and surgery were consulted.  ERCP and cholecystectomy were not advised.  LFTs improved with conservative treatment.  It was thought that she passed a gallstone.  Currently asymptomatic.  She followed up with her PCP Dr. Nichole posthospitalization and labs returned to normal.  History of Present Illness Altered bowel habits - Sensation of something inside the rectum obstructing stool passage, requiring manual assistance to evacuate - Stool consistency  described as similar to peanut butter, neither firm nor watery - Frequent bowel movements, up to three by noon and additional episodes in the afternoon - Recent reduction in Imodium  use to avoid slowing bowel movements; no Imodium  taken in the past two weeks - She denies constipation, hard stools, or watery diarrhea.  Abdominal discomfort - Lower abdominal cramping and rolling sensation, worsened when lying down -chronic for many years and relieved with occasional hyoscyamine . - Lower abdominal cramping managed effectively with hyoscyamine , taken as needed, especially at bedtime when symptoms intensify  Dietary triggers and modifications - Onions identified as a significant trigger for gastrointestinal symptoms - Recent increased consumption of sweets has exacerbated acid reflux symptoms - Mindful of dietary choices and researching trigger foods  Acid reflux - Uses pantoprazole  once daily before meals for management - Recent worsening of symptoms attributed to increased intake of sweets  Recent hospitalization and weight changes - Hospitalized in August for six days, followed by three weeks in rehabilitation - Gallbladder surgery considered due to gallstones but not performed - Diagnosed with septicemia during hospitalization - Some improvement in diarrhea symptoms since hospitalization - Currently wheelchair-bound - Recent weight gain, particularly around the abdomen, attributed to dietary indulgences during Thanksgiving and birthday     Past Medical History:  Diagnosis Date   Arthritis    Atrophic vaginitis    Basal cell carcinoma    Cervical dysplasia    Complication of anesthesia    pseudocholinesterase deficiency-hard to wake up   Falls    GERD (gastroesophageal reflux disease)    Hypertension  IBS (irritable bowel syndrome)    Jaw atrophy    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   Macular hole of left eye    Muscular dystrophy (HCC)    Osteoporosis     pelvic fracture   Pseudocholinesterase deficiency    Scoliosis     Past Surgical History:  Procedure Laterality Date   ABDOMINAL HYSTERECTOMY     TAH BSO   BREAST LUMPECTOMY  1971   benign   CESAREAN SECTION     x2   COLPOSCOPY     EYE SURGERY  2003   left   TOTAL ABDOMINAL HYSTERECTOMY W/ BILATERAL SALPINGOOPHORECTOMY  1981   TRIGGER FINGER RELEASE  07/28/2012   Procedure: RELEASE TRIGGER FINGER/A-1 PULLEY;  Surgeon: Lamar LULLA Leonor Mickey., MD;  Location: [facility];  Service: Orthopedics;  Laterality: Right;  EXCISION CYST RIGHT LONG A-1 RELEASE A-1 RIGHT LONG     Prior to Admission medications  Medication Sig Start Date End Date Taking? Authorizing Provider  albuterol  (PROVENTIL ) (2.5 MG/3ML) 0.083% nebulizer solution Take 3 mLs (2.5 mg total) by nebulization every 2 (two) hours as needed for wheezing. 06/14/23   Pokhrel, Vernal, MD  Artificial Tear Solution (SYSTANE CONTACTS) SOLN Apply to eye.    [provider]  Calcium Carbonate Antacid (TUMS PO) Take by mouth daily as needed. chewable    [provider]  Calcium Carbonate-Vit D-Min (CALTRATE PLUS PO) Take by mouth.      [provider]  co-enzyme Q-10 30 MG capsule Take 1 capsule (30 mg total) by mouth 3 (three) times daily. 09/21/23   Dohmeier, Dedra, MD  denosumab  (PROLIA ) 60 MG/ML SOLN injection Inject 60 mg into the skin every 6 (six) months. Administer in upper arm, thigh, or abdomen    [provider]  diltiazem  (CARDIZEM  CD) 240 MG 24 hr capsule Take 240 mg by mouth daily.      [provider]  ergocalciferol  (VITAMIN D2) 50000 UNITS capsule Take 50,000 Units by mouth 2 (two) times a week.     [provider]  fluticasone (FLONASE) 50 MCG/ACT nasal spray Place 1-2 sprays into both nostrils daily. 01/08/20   [provider]  furosemide  (LASIX ) 20 MG tablet Take 1 tablet (20 mg total) by mouth 2 (two) times daily. 06/14/23   Pokhrel, Laxman, MD   hyoscyamine  (LEVSIN  SL) 0.125 MG SL tablet Place 1 tablet (0.125 mg total) under the tongue every 6 (six) hours as needed for cramping. 06/14/23   Pokhrel, Laxman, MD  JANUVIA 100 MG tablet Take 100 mg by mouth daily. 02/08/24   [provider]  loperamide  (IMODIUM ) 2 MG capsule Take 1 capsule (2 mg total) by mouth 4 (four) times daily as needed for diarrhea or loose stools. 06/14/23   Pokhrel, Laxman, MD  losartan (COZAAR) 100 MG tablet Take 100 mg by mouth daily. 02/08/24   [provider]  metoprolol  succinate (TOPROL -XL) 25 MG 24 hr tablet TAKE 1 TABLET (25 MG TOTAL) BY MOUTH DAILY. 12/08/23   Ladona Heinz, MD  montelukast (SINGULAIR) 10 MG tablet Take 10 mg by mouth daily. 05/13/23   [provider]  Multiple Vitamins-Minerals (PRESERVISION/LUTEIN) CAPS Take 2 capsules by mouth daily.    [provider]  Omega-3 Fatty Acids (FISH OIL ) 1200 MG CAPS Take 2 capsules (2,400 mg total) by mouth daily. 09/21/23   Dohmeier, Dedra, MD  ondansetron  (ZOFRAN ) 4 MG tablet Take 1 tablet (4 mg total) by mouth every 6 (six) hours as  needed for nausea. 06/14/23   Pokhrel, Laxman, MD  pantoprazole  (PROTONIX ) 20 MG tablet TAKE 1 TABLET BY MOUTH TWICE A DAY 05/17/24   [place], PA-C  potassium chloride  SA (KLOR-CON  M) 20 MEQ tablet Take 2 tablets (40 mEq total) by mouth daily for 5 days. 06/14/23 06/19/23  Pokhrel, Laxman, MD  Probiotic Product (PROBIOTIC PO) Take 1 Dose by mouth daily.    [provider]    Family History  Problem Relation Age of Onset   Hypertension Sister      Social History[1]  Allergies as of 09/27/2024 - Review Complete 09/27/2024  Allergen Reaction Noted   Anesthetics, amide  07/04/2013   Ciprofloxacin Swelling 09/08/2011   Celebrex [celecoxib]  09/08/2011   Metronidazole  09/27/2024   Procaine  09/27/2024   Succinylcholine  09/27/2024   Sulfa antibiotics  09/08/2011   Codeine Nausea And Vomiting 09/08/2011   Morphine and codeine Nausea And  Vomiting 09/08/2011    Review of Systems:    All systems reviewed and negative except where noted in HPI.   Physical Exam:  BP 134/82 (BP Location: Left Arm, Patient Position: Sitting, Cuff Size: Normal)   Pulse 92 Comment: irregular  Ht 5' (1.524 m)   Wt 160 lb 2 oz (72.6 kg)   BMI 31.27 kg/m  No LMP recorded. Patient has had a hysterectomy.  General:   Alert, feeble elderly female in a wheelchair, pleasant and cooperative in NAD. Lungs:  Respirations even and unlabored.  Clear throughout to auscultation.   No wheezes, crackles, or rhonchi. No acute distress. Heart:  Regular rate and rhythm; no murmurs, clicks, rubs, or gallops. Abdomen:  Normal bowel sounds.  No bruits.  Soft, and non-distended without masses, hepatosplenomegaly or hernias noted.  Mild bilateral lower abdominal tenderness.  No upper abdominal tenderness.  No guarding or rebound tenderness.    Neurologic:  Alert and oriented x3;  grossly normal neurologically.  Good mental status.  Answers questions appropriately. Psych:  Alert and cooperative. Normal mood and affect.   Imaging Studies: No results found.  Labs: CBC    Component Value Date/Time   WBC 6.0 06/13/2023 0309   RBC 3.36 (L) 06/13/2023 0309   HGB 10.8 (L) 06/13/2023 0309   HGB 13.6 03/16/2023 1340   HCT 33.0 (L) 06/13/2023 0309   HCT 40.4 03/16/2023 1340   PLT 181 06/13/2023 0309   MCV 98.2 06/13/2023 0309   MCV 96 03/16/2023 1340    CMP     Component Value Date/Time   NA 138 06/14/2023 0615   NA 139 03/16/2023 1340   K 3.2 (L) 06/14/2023 0615   CL 105 06/14/2023 0615   CO2 24 06/14/2023 0615   GLUCOSE 166 (H) 06/14/2023 0615   BUN 9 06/14/2023 0615   BUN 16 03/16/2023 1340   CREATININE 0.75 06/14/2023 0615   CREATININE 0.76 08/28/2012 1249   CALCIUM 8.6 (L) 06/14/2023 0615   CALCIUM 10.1 08/28/2012 1249   PROT 5.6 (L) 06/14/2023 0615   PROT 6.6 03/16/2023 1340   ALBUMIN 2.6 (L) 06/14/2023 0615   ALBUMIN 4.1 03/16/2023 1340   AST  38 06/14/2023 0615   ALT 88 (H) 06/14/2023 0615   ALKPHOS 122 06/14/2023 0615   BILITOT 1.1 06/14/2023 0615   BILITOT 0.6 03/16/2023 1340   GFRNONAA >60 06/14/2023 0615   GFRAA >90 07/27/2012 1100    Assessment and Plan:   Catherine Harding is a 89 y.o. y/o female has been referred for followup of: Assessment &  Plan 1.  Irritable bowel syndrome with diarrhea and rectal muscle weakness Chronic IBS with diarrhea and rectal muscle weakness. Frequent bowel movements with peanut butter-like consistency. Manual assistance needed due to rectal muscle weakness. No bowel obstruction. Reduced Imodium  to prevent constipation. Hyoscyamine  effective for cramping. - Refilled hyoscyamine  for cramping, use as needed, especially at bedtime. - Advised glycerin suppositories before bowel movements. - Continue IBS management with dietary modifications, avoid trigger foods like onions.  2.  Gastroesophageal reflux disease Managed with pantoprazole . Increased reflux after sweets. Kidney function allows potential pantoprazole  reduction. Discussed adjusting dosage to minimize kidney impact. - Refilled pantoprazole  20 mg, option for once or twice daily based on symptoms. - Advised taking pantoprazole  30 minutes before meals, adjust timing based on symptoms. - Continue to avoid GERD trigger foods and drinks.  3.  Cholelithiasis: Currently asymptomatic She was hospitalized last year for symptomatic cholelithiasis.  She is was thought to have passed a gallstone.  Surgery was consulted and did not recommend cholecystectomy due to advanced age and comorbidities. -Continue low-fat diet.  Follow-up if she becomes symptomatic.   Follow up 1 year to refill medications or as needed based on GI symptoms.  Catherine Console, PA-C       [1]  Social History Tobacco Use   Smoking status: Never   Smokeless tobacco: Never  Vaping Use   Vaping status: Never Used  Substance Use Topics   Alcohol  use: No    Alcohol /week:  0.0 standard drinks of alcohol     Comment: rare   Drug use: No

## 2024-09-27 ENCOUNTER — Encounter: Payer: Self-pay | Admitting: Physician Assistant

## 2024-09-27 ENCOUNTER — Ambulatory Visit: Admitting: Physician Assistant

## 2024-09-27 VITALS — BP 134/82 | HR 92 | Ht 60.0 in | Wt 160.1 lb

## 2024-09-27 DIAGNOSIS — K219 Gastro-esophageal reflux disease without esophagitis: Secondary | ICD-10-CM | POA: Diagnosis not present

## 2024-09-27 DIAGNOSIS — K8050 Calculus of bile duct without cholangitis or cholecystitis without obstruction: Secondary | ICD-10-CM

## 2024-09-27 DIAGNOSIS — K580 Irritable bowel syndrome with diarrhea: Secondary | ICD-10-CM

## 2024-09-27 MED ORDER — HYOSCYAMINE SULFATE 0.125 MG SL SUBL: 0.1250 mg | SUBLINGUAL_TABLET | Freq: Four times a day (QID) | SUBLINGUAL | 11 refills | Status: AC | PRN

## 2024-09-27 MED ORDER — PANTOPRAZOLE SODIUM 20 MG PO TBEC: 20.0000 mg | DELAYED_RELEASE_TABLET | Freq: Two times a day (BID) | ORAL | 3 refills | Status: AC

## 2024-09-27 NOTE — Patient Instructions (Addendum)
° °  Glycerine Suppositories: Insert one into the rectum before a bowel movement.  I have refilled your prescriptions: Hyosciamine 0.125mg  SL once daily before bedtime as needed for lower abdominal cramping. Pantoprazole  20mg  1 tablet once or twice daily for acid reflux.  Please follow up sooner if symptoms increase or worsen  Due to recent changes in healthcare laws, you may see the results of your imaging and laboratory studies on MyChart before your provider has had a chance to review them.  We understand that in some cases there may be results that are confusing or concerning to you. Not all laboratory results come back in the same time frame and the provider may be waiting for multiple results in order to interpret others.  Please give us  48 hours in order for your provider to thoroughly review all the results before contacting the office for clarification of your results.   Thank you for trusting me with your gastrointestinal care!   Ellouise Console, PA-C _______________________________________________________  If your blood pressure at your visit was 140/90 or greater, please contact your primary care physician to follow up on this.  _______________________________________________________  If you are age 46 or older, your body mass index should be between 23-30. Your Body mass index is 31.27 kg/m. If this is out of the aforementioned range listed, please consider follow up with your Primary Care Provider.  If you are age 41 or younger, your body mass index should be between 19-25. Your Body mass index is 31.27 kg/m. If this is out of the aformentioned range listed, please consider follow up with your Primary Care Provider.   ________________________________________________________  The [facility] providers would like to encourage you to use MYCHART to communicate with providers for non-urgent requests or questions.  Due to long hold times on the telephone, sending your provider a  message by [facility] may be a faster and more efficient way to get a response.  Please allow 48 business hours for a response.  Please remember that this is for non-urgent requests.  _______________________________________________________

## 2024-10-02 ENCOUNTER — Other Ambulatory Visit (HOSPITAL_COMMUNITY): Payer: Self-pay | Admitting: Endocrinology

## 2024-10-16 ENCOUNTER — Ambulatory Visit (HOSPITAL_COMMUNITY)
Admission: RE | Admit: 2024-10-16 | Discharge: 2024-10-16 | Disposition: A | Discharge status: Home / Self Care | Source: Ambulatory Visit | Attending: Endocrinology | Admitting: Endocrinology

## 2024-10-16 VITALS — BP 119/61 | HR 129 | Temp 97.7°F | Resp 17

## 2024-10-16 DIAGNOSIS — M810 Age-related osteoporosis without current pathological fracture: Secondary | ICD-10-CM | POA: Diagnosis present

## 2024-10-16 MED ORDER — DENOSUMAB 60 MG/ML ~~LOC~~ SOSY
PREFILLED_SYRINGE | SUBCUTANEOUS | Status: AC
  Filled 2024-10-16: qty 1

## 2024-10-16 MED ORDER — DENOSUMAB 60 MG/ML ~~LOC~~ SOSY
60.0000 mg | PREFILLED_SYRINGE | Freq: Once | SUBCUTANEOUS | Status: AC
  Administered 2024-10-16: 60 mg via SUBCUTANEOUS

## 2024-10-31 ENCOUNTER — Ambulatory Visit: Admitting: Neurology

## 2024-10-31 ENCOUNTER — Encounter: Payer: Self-pay | Admitting: Neurology

## 2024-10-31 VITALS — BP 139/86 | HR 78 | Ht 62.0 in | Wt 157.0 lb

## 2024-10-31 DIAGNOSIS — H814 Vertigo of central origin: Secondary | ICD-10-CM

## 2024-10-31 DIAGNOSIS — K582 Mixed irritable bowel syndrome: Secondary | ICD-10-CM | POA: Diagnosis not present

## 2024-10-31 DIAGNOSIS — H3530 Unspecified macular degeneration: Secondary | ICD-10-CM | POA: Diagnosis not present

## 2024-10-31 DIAGNOSIS — G71039 Limb girdle muscular dystrophy, unspecified: Secondary | ICD-10-CM | POA: Diagnosis not present

## 2024-10-31 DIAGNOSIS — M4120 Other idiopathic scoliosis, site unspecified: Secondary | ICD-10-CM | POA: Diagnosis not present

## 2024-10-31 DIAGNOSIS — Z9181 History of falling: Secondary | ICD-10-CM

## 2024-10-31 NOTE — Progress Notes (Signed)
 "        Provider:  Dedra Gores, MD  Primary Care Physician:  Nichole Senior, MD [address] [place] [address]     Referring Provider: Nichole Senior, Md [address] [place],  [place] [address]          Chief Complaint according to patient   Patient presents with:                HISTORY OF PRESENT ILLNESS:  Catherine Harding is a 89 y.o. 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 No falls.  Had 2 episodes of vertigo, the room spinning around - this occurred twice and each time she was seated in her wheelchair.   Can not identify a trigger movement- but sinus congestion.  She is reporting vision problems.she has trouble to use a regular keyboard. She uses computer to bank and pay bills online.    Since her birthday  she is looking for a caregiver 5 days a week.  She still can transfer to the bed, and reach the refrigerator. More trouble with reaching the commode- PT with [facility] has helped her , PT came to the house.  She would like to continue this therapy.   She has trouble eliminating stool, feels that stool is collecting in the ampulla recti but not able to push.    Catherine Harding is a 89 y.o. female patient who is here for revisit 02/28/2024 for  LG-Muscular Dystrophy.  She has secondary scoliosis and has pain. She has IBS and is concerned it may be Crohn's disease.  .  Chief concern according to patient :   I have not been able to shower or bathe alone any longer, I need rectal stimulation to empty my bowels and I then my legs are swelling. I need hearing aids seen and my vision has declined.  I have macular degeneration.  I need a magnifying glass to read.  I have foot pain. Need help with compression stockings  in AM and PM.  She  has to transfer cooking ingredients on a tray on her wheelchair from the fridge to the stove. She has a small stoop at the entrance of the home and 2  stairs to the garage- these are difficult to handle. Her neighbors have volunteered to drive when needed.      Her daughter is also  sickly now and she has alzheimer's early age onset- gets  infusion treatments at [facility].       Review of Systems: Out of a complete 14 system review, the patient complains of only the following symptoms, and all other reviewed systems are negative.:   GDS 4/ 15 points.        Social History   Socioeconomic History   Marital status: Divorced    Spouse name: Not on file   Number of children: 3   Years of education: 13   Highest education level: Not on file  Occupational History   Occupation: retired    Comment: Haematologist at [employer]  Tobacco Use   Smoking status: Never   Smokeless tobacco: Never  Vaping Use   Vaping status: Never Used  Substance and Sexual Activity   Alcohol  use: No    Alcohol /week: 0.0 standard drinks of alcohol     Comment: rare   Drug use: No   Sexual activity: Never    Birth control/protection: Surgical  Other Topics  Concern   Not on file  Social History Narrative   Patient lives at home alone and she is divorced. Patient is retired.   Right handed.   Caffeine- one cup daily.   Uses walker.   Grown children, live 7 miles away.     Social Drivers of Health   Tobacco Use: Low Risk (10/31/2024)   Patient History    Smoking Tobacco Use: Never    Smokeless Tobacco Use: Never    Passive Exposure: Not on file  Financial Resource Strain: Not on file  Food Insecurity: No Food Insecurity (06/09/2023)   Hunger Vital Sign    Worried About Running Out of Food in the Last Year: Never true    Ran Out of Food in the Last Year: Never true  Transportation Needs: No Transportation Needs (06/09/2023)   PRAPARE - Administrator, Civil Service (Medical): No    Lack of Transportation (Non-Medical): No  Physical Activity: Not on file  Stress: Not on file  Social Connections: Not on file  Depression (EYV7-0):  Not on file  Alcohol  Screen: Not on file  Housing: Low Risk (06/09/2023)   Housing    Last Housing Risk Score: 0  Utilities: Not At Risk (06/09/2023)   AHC Utilities    Threatened with loss of utilities: No  Health Literacy: Not on file    Family History  Problem Relation Age of Onset   Hypertension Sister     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   Macular hole of left eye    Muscular dystrophy (HCC)    Osteoporosis    pelvic fracture   Pseudocholinesterase deficiency    Scoliosis     Past Surgical History:  Procedure Laterality Date   ABDOMINAL HYSTERECTOMY     TAH BSO   BREAST LUMPECTOMY  1971   benign   CESAREAN SECTION     x2   COLPOSCOPY     EYE SURGERY  2003   left   TOTAL ABDOMINAL HYSTERECTOMY W/ BILATERAL SALPINGOOPHORECTOMY  1981   TRIGGER FINGER RELEASE  07/28/2012   Procedure: RELEASE TRIGGER FINGER/A-1 PULLEY;  Surgeon: Lamar LULLA Leonor Mickey., MD;  Location:  SUR[facility]ERY CENTER;  Service: Orthopedics;  Laterality: Right;  EXCISION CYST RIGHT LONG A-1 RELEASE A-1 RIGHT LONG      Medications Ordered Prior to Encounter[1]  Allergies[2]   DIAGNOSTIC DATA (LABS, IMAGING, TESTING) - I reviewed patient records, labs, notes, testing and imaging myself where available.  Lab Results  Component Value Date   WBC 6.0 06/13/2023   HGB 10.8 (L) 06/13/2023   HCT 33.0 (L) 06/13/2023   MCV 98.2 06/13/2023   PLT 181 06/13/2023      Component Value Date/Time   NA 138 06/14/2023 0615   NA 139 03/16/2023 1340   K 3.2 (L) 06/14/2023 0615   CL 105 06/14/2023 0615   CO2 24 06/14/2023 0615   GLUCOSE 166 (H) 06/14/2023 0615   BUN 9  06/14/2023 0615   BUN 16 03/16/2023 1340   CREATININE 0.75 06/14/2023 0615   CREATININE 0.76 08/28/2012 1249  CALCIUM 8.6 (L) 06/14/2023 0615   CALCIUM 10.1 08/28/2012 1249   PROT 5.6 (L) 06/14/2023 0615   PROT 6.6 03/16/2023 1340   ALBUMIN 2.6 (L) 06/14/2023 0615   ALBUMIN 4.1 03/16/2023 1340   AST 38 06/14/2023 0615   ALT 88 (H) 06/14/2023 0615   ALKPHOS 122 06/14/2023 0615   BILITOT 1.1 06/14/2023 0615   BILITOT 0.6 03/16/2023 1340   GFRNONAA >60 06/14/2023 0615   GFRAA >90 07/27/2012 1100   No results found for: CHOL, HDL, LDLCALC, LDLDIRECT, TRIG, CHOLHDL No results found for: YHAJ8R No results found for: VITAMINB12 No results found for: TSH  PHYSICAL EXAM:  Vitals:   10/31/24 1012  BP: 139/86  Pulse: 78   No data found. Body mass index is 28.72 kg/m.   Wt Readings from Last 3 Encounters:  10/31/24 157 lb (71.2 kg)  09/27/24 160 lb 2 oz (72.6 kg)  02/28/24 151 lb (68.5 kg)     Ht Readings from Last 3 Encounters:  10/31/24 5' 2 (1.575 m)  09/27/24 5' (1.524 m)  02/28/24 5' 2 (1.575 m)      General: The patient is awake, alert and appears not in acute distress and groomed. Head: Normocephalic, atraumatic.  Neck is supple. Stooped . Cardiovascular:  Regular rate and cardiac rhythm by pulse, without distended neck veins. Respiratory: Lungs are clear to auscultation.   She has scoliosis.  Skin:  With evidence of ankle edema,. Trunk: scoliosis    NEUROLOGIC EXAM: The patient is awake and alert, oriented to place and time.   Memory subjective described as intact.  Attention span & concentration ability appears normal.  Speech is fluent,  but there is some shortness of breath. Mood and affect are appropriate.   Cranial nerves: no loss of smell or taste reported  Pupils are equal and briskly reactive to light. Funduscopic exam .  Extraocular movements in vertical and horizontal planes were intact and without nystagmus. No  Diplopia. Visual fields by finger perimetry are intact. Hearing impaired. Facial sensation intact to fine touch.  Facial motor strength is symmetric and tongue and uvula move midline.  Neck ROM : rotation, tilt and flexion extension were normal for age and shoulder shrug was symmetrical.    Motor exam:  right shoulder torn rotatror cuff,  and left shoulder lower than right - scoliosis.  Normal tone without cog -wheeling, no rigor.  symmetric grip strength .  WHEELCHAIR  BOUND now.    Sensory:  Fine touch, and vibration are felt on both knees a but not in the ankles- swelling related?  -  Proprioception tested in the upper extremities was normal.  ASSESSMENT AND PLAN :   89 y.o. year old female  here with: slow progression of Lim Girdle Muscular dystrophy  .  Scoliosis,  but cognitively in excellent shape.  Needing ADL assistance based on her physical impairment, and vision and hearing impairment play an additional role.      1) Limb girdle muscular dystrophy.  High risk of falling- PT to be resumed. She needs  help to transfer and wants to revent   .    2) severe vision impairment , macular degeneration   3)  new onset vertigo, likely related to sinus congestion, occurred twice    PLAN : PT in the home to resume.   Visual aids for computer  high contrast, magnified screen. RV in 8 months.      I would like to thank Nichole Senior, MD for allowing  me to meet with this pleasant patient.    The patient's condition requires frequent monitoring and adjustments in the treatment plan, reflecting the ongoing complexity of care.  This provider is the continuing focal point for all needed services for this condition.  After spending a total time of  35  minutes face to face and time for  history taking, physical and neurologic examination, review of laboratory studies,  personal review of imaging studies, reports and results of other testing and review of referral information /  records as far as provided in visit,   Electronically signed by: Dedra Gores, MD 10/31/2024 10:44 AM  [facility] Board certified by The [employer] of Sleep Medicine and Diplomate of the [employer] of Sleep Medicine. Board certified In Neurology through the ABPN, Fellow of the [employer] of Neu[employer].      [1]  Current Outpatient Medications on File Prior to Visit  Medication Sig Dispense Refill   albuterol  (PROVENTIL ) (2.5 MG/3ML) 0.083% nebulizer solution Take 3 mLs (2.5 mg total) by nebulization every 2 (two) hours as needed for wheezing.     alum & mag hydroxide-simeth (MYLANTA MAXIMUM STRENGTH) 400-400-40 MG/5ML suspension Take 10 mLs by mouth as needed.     Artificial Tear Solution (SYSTANE CONTACTS) SOLN Apply to eye.     Calcium Carbonate Antacid (TUMS PO) Take by mouth daily as needed. chewable     Calcium Carbonate-Vit D-Min (CALTRATE PLUS PO) Take by mouth.       co-enzyme Q-10 30 MG capsule Take 1 capsule (30 mg total) by mouth 3 (three) times daily. 90 capsule 5   denosumab  (PROLIA ) 60 MG/ML SOLN injection Inject 60 mg into the skin every 6 (six) months. Administer in upper arm, thigh, or abdomen     diltiazem  (CARDIZEM  CD) 240 MG 24 hr capsule Take 240 mg by mouth daily.       ergocalciferol  (VITAMIN D2) 50000 UNITS capsule Take 50,000 Units by mouth 2 (two) times a week.      fluocinonide (LIDEX) 0.05 % external solution Apply 1 Application topically as needed.     fluticasone (FLONASE) 50 MCG/ACT nasal spray Place 1-2 sprays into both nostrils daily.     furosemide  (LASIX ) 20 MG tablet Take 1 tablet (20 mg total) by mouth 2 (two) times daily.     hyoscyamine  (LEVSIN  SL) 0.125 MG SL tablet Place 1 tablet (0.125 mg total) under the tongue every 6 (six) hours as needed for cramping. 30 tablet 11   JANUVIA 100 MG tablet Take 100 mg by mouth daily.     loperamide  (IMODIUM ) 2 MG capsule Take 1 capsule (2 mg total) by  mouth 4 (four) times daily as needed for diarrhea or loose stools.     Loperamide -Simethicone (IMODIUM  MULTI-SYMPTOM RELIEF) 2-125 MG TABS Take 1 tablet by mouth as needed.     losartan (COZAAR) 100 MG tablet Take 100 mg by mouth daily.     metoprolol  succinate (TOPROL -XL) 25 MG 24 hr tablet TAKE 1 TABLET (25 MG TOTAL) BY MOUTH DAILY. 90 tablet 2   montelukast (SINGULAIR) 10 MG tablet Take 10 mg by mouth daily.     Multiple Vitamins-Minerals (PRESERVISION/LUTEIN) CAPS Take 2 capsules by mouth daily.     Omega-3 Fatty Acids (FISH OIL ) 1200 MG CAPS Take 2 capsules (2,400 mg total) by mouth daily. 30 capsule 5   ondansetron  (ZOFRAN ) 4 MG tablet Take 1 tablet (4 mg total) by mouth every 6 (six) hours  as needed for nausea.     pantoprazole  (PROTONIX ) 20 MG tablet Take 1 tablet (20 mg total) by mouth 2 (two) times daily. 180 tablet 3   Polyethyl Glycol-Propyl Glycol (SYSTANE) 0.4-0.3 % SOLN Place 1 drop into both eyes as needed.     potassium chloride  SA (KLOR-CON  M) 20 MEQ tablet Take 2 tablets (40 mEq total) by mouth daily for 5 days.     Probiotic Product (PROBIOTIC PO) Take 1 Dose by mouth daily.     No current facility-administered medications on file prior to visit.  [2]  Allergies Allergen Reactions   Anesthetics, Amide    Ciprofloxacin Swelling   Celebrex [Celecoxib]    Metronidazole     Other Reaction(s): do not know, Not available   Procaine     Other Reaction(s): do not wake up easily   Succinylcholine     Other Reaction(s): Not available, patient has a pseudocholinesterase deficiency   Sulfa Antibiotics    Codeine Nausea And Vomiting   Morphine And Codeine Nausea And Vomiting   "

## 2024-11-01 ENCOUNTER — Other Ambulatory Visit: Payer: Self-pay | Admitting: *Deleted

## 2024-11-01 DIAGNOSIS — M4120 Other idiopathic scoliosis, site unspecified: Secondary | ICD-10-CM

## 2024-11-01 DIAGNOSIS — K582 Mixed irritable bowel syndrome: Secondary | ICD-10-CM

## 2024-11-01 DIAGNOSIS — H814 Vertigo of central origin: Secondary | ICD-10-CM

## 2024-11-01 DIAGNOSIS — G71039 Limb girdle muscular dystrophy, unspecified: Secondary | ICD-10-CM

## 2024-11-01 DIAGNOSIS — Z9181 History of falling: Secondary | ICD-10-CM

## 2024-11-01 NOTE — Addendum Note (Signed)
 Addended by: CHALICE SAUNAS on: 11/01/2024 12:20 PM   Modules accepted: Orders

## 2024-11-06 ENCOUNTER — Telehealth: Payer: Self-pay | Admitting: Neurology

## 2024-11-06 NOTE — Telephone Encounter (Signed)
 [facility] will be taking Pt.

## 2025-04-16 ENCOUNTER — Encounter (HOSPITAL_COMMUNITY)

## 2025-07-02 ENCOUNTER — Ambulatory Visit: Admitting: Neurology
